# Patient Record
Sex: Female | Born: 1970 | Race: White | Hispanic: No | Marital: Married | Smoking: Never smoker
Health system: Southern US, Community
[De-identification: ages and names within clinical notes are randomized; demographics above are authoritative.]

## PROBLEM LIST (undated history)

## (undated) DIAGNOSIS — Z1371 Encounter for nonprocreative screening for genetic disease carrier status: Secondary | ICD-10-CM

## (undated) DIAGNOSIS — Z9889 Other specified postprocedural states: Secondary | ICD-10-CM

## (undated) DIAGNOSIS — C801 Malignant (primary) neoplasm, unspecified: Secondary | ICD-10-CM

## (undated) DIAGNOSIS — C439 Malignant melanoma of skin, unspecified: Secondary | ICD-10-CM

## (undated) DIAGNOSIS — C50919 Malignant neoplasm of unspecified site of unspecified female breast: Secondary | ICD-10-CM

## (undated) DIAGNOSIS — D649 Anemia, unspecified: Secondary | ICD-10-CM

## (undated) DIAGNOSIS — R112 Nausea with vomiting, unspecified: Secondary | ICD-10-CM

## (undated) DIAGNOSIS — Z9221 Personal history of antineoplastic chemotherapy: Secondary | ICD-10-CM

## (undated) HISTORY — DX: Malignant (primary) neoplasm, unspecified: C80.1

## (undated) HISTORY — DX: Malignant melanoma of skin, unspecified: C43.9

## (undated) HISTORY — DX: Malignant neoplasm of unspecified site of unspecified female breast: C50.919

## (undated) HISTORY — DX: Encounter for nonprocreative screening for genetic disease carrier status: Z13.71

---

## 2008-02-01 ENCOUNTER — Ambulatory Visit: Payer: Self-pay

## 2009-12-21 DIAGNOSIS — C439 Malignant melanoma of skin, unspecified: Secondary | ICD-10-CM

## 2009-12-21 HISTORY — DX: Malignant melanoma of skin, unspecified: C43.9

## 2010-01-21 ENCOUNTER — Ambulatory Visit: Payer: Self-pay | Admitting: Dermatology

## 2010-01-25 ENCOUNTER — Ambulatory Visit: Payer: Self-pay | Admitting: Dermatology

## 2010-02-20 HISTORY — PX: MELANOMA EXCISION: SHX5266

## 2011-01-18 ENCOUNTER — Ambulatory Visit: Payer: Self-pay | Admitting: Dermatology

## 2013-02-25 ENCOUNTER — Ambulatory Visit: Payer: Self-pay

## 2014-03-16 ENCOUNTER — Ambulatory Visit: Payer: Self-pay

## 2014-03-22 DIAGNOSIS — C50919 Malignant neoplasm of unspecified site of unspecified female breast: Secondary | ICD-10-CM

## 2014-03-22 DIAGNOSIS — Z1371 Encounter for nonprocreative screening for genetic disease carrier status: Secondary | ICD-10-CM

## 2014-03-22 HISTORY — DX: Malignant neoplasm of unspecified site of unspecified female breast: C50.919

## 2014-03-22 HISTORY — PX: PORTACATH PLACEMENT: SHX2246

## 2014-03-22 HISTORY — DX: Encounter for nonprocreative screening for genetic disease carrier status: Z13.71

## 2014-03-24 ENCOUNTER — Ambulatory Visit: Payer: Self-pay

## 2014-03-24 DIAGNOSIS — C801 Malignant (primary) neoplasm, unspecified: Secondary | ICD-10-CM

## 2014-03-24 HISTORY — PX: BREAST BIOPSY: SHX20

## 2014-03-24 HISTORY — DX: Malignant (primary) neoplasm, unspecified: C80.1

## 2014-03-30 ENCOUNTER — Ambulatory Visit: Payer: BC Managed Care – PPO

## 2014-03-30 ENCOUNTER — Encounter: Payer: Self-pay | Admitting: General Surgery

## 2014-03-30 ENCOUNTER — Ambulatory Visit (INDEPENDENT_AMBULATORY_CARE_PROVIDER_SITE_OTHER): Payer: BC Managed Care – PPO | Admitting: General Surgery

## 2014-03-30 VITALS — BP 110/76 | HR 76 | Resp 12 | Ht 64.0 in | Wt 117.0 lb

## 2014-03-30 DIAGNOSIS — C50911 Malignant neoplasm of unspecified site of right female breast: Secondary | ICD-10-CM

## 2014-03-30 NOTE — Progress Notes (Signed)
Patient ID: Alecea Trego, female   DOB: Feb 21, 1971, 43 y.o.   MRN: [id number]  Chief Complaint  Patient presents with  . Cancer    right breast    HPI Landyn Lorincz is a 44 y.o. female.  Here today for breast cancer evaluation. Prior to her recent mammogram she could not feel anything different in the breast. Denies any breast trauma and no family history of breast cancer.   Her mother had lung cancer in her mid 79's and has since passed.  Her routine mammogram was 03-04-14. Additional views showed a progressive area of microcalcifications from her fall 2014 exam. 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 She is here today with her husband, Remo Lipps. She has two boys 6 and 13 whom she did breastfeed.  She did complete genetic testing this morning at her GYN office.   HPI  Past Medical History  Diagnosis Date  . Cancer 03-24-14    Right breast, microcalcifications. ER negative, PR less than 10%, HER-2/neu not amplified.  . 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 Breast biopsy Right 03-24-14  . Melanoma excision  Nov 2011    neck    Family History  Problem Relation Age of Onset  . Cancer Mother     lung ? mid 110    Social History History  Substance Use Topics  . Smoking status: Never Smoker   . Smokeless tobacco: Never Used  . Alcohol Use: 0.0 oz/week    0 Not specified per week     Comment: 3/week    Allergies  Allergen Reactions  . Sulfa Antibiotics Nausea Only    No current outpatient prescriptions on file.   No current facility-administered medications for this visit.    Review of Systems Review of Systems  Constitutional: Negative.   Respiratory: Negative.   Cardiovascular: Negative.     Blood pressure 110/76, pulse 76, resp. rate 12, height 5' 4"  (1.626 m), weight 117 lb (53.071 kg), last menstrual period 03/06/2014.  Physical  Exam Physical Exam  Constitutional: She is oriented to person, place, and time. She appears well-developed and well-nourished.  Neck: Neck supple.  Cardiovascular: Normal rate, regular rhythm and normal heart sounds.   Pulmonary/Chest: Effort normal and breath sounds normal. Right breast exhibits no inverted nipple, no mass, no nipple discharge, no skin change and no tenderness. Left breast exhibits no inverted nipple, no mass, no nipple discharge, no skin change and no tenderness.  Lymphadenopathy:    She has no cervical adenopathy.    She has no axillary adenopathy.  Neurological: She is alert and oriented to person, place, and time.  Skin: Skin is warm and dry.    Data Reviewed [phone] mammograms reviewed. Her most recent study showed an increasing cluster of microcalcifications deep in the right breast in the upper outer quadrant.  Biopsy dated 03/24/2014 showed invasive mammary carcinoma no special type. Histologic grade 3. ER negative, PR less than 10%, HER-2/neu not amplified. In the core biopsies the size was 6 mm at a minimum.  Ultrasound examination of the right breast was completed to determine if the biopsy site be identified should the patient choose breast conservation. In the right breast that the eighth 30-9:00 position, 6 cm from the nipple the biopsy clip is evident as well as a 1.2 cm biopsy cavity.  In the axilla there is no  enlarged node measuring 1.3 x 1.6 x 1.8 cm. Loss of normal architecture is appreciated. No additional nodes are appreciated. Enlarged lymph nodes were not noted nor seen today on her recent mammograms.  FNA sampling of the node was recommended and accepted by the patient. This was completed using 1 mL of 1% plain Xylocaine. Multiple passes with a 22-gauge needle were undertaken. Bleeding was noted. Slides 4 were prepared for cytology.  Considering the hematoma formation evident on clinical exam and bruising around the areola, this is most likely  reactive in nature.    Assessment    Right breast cancer, likely stage I.    Plan    Options for management were reviewed: 1) mastectomy with or without immediate reconstruction versus 2) wide local excision followed by postoperative radiation therapy were presented as therapeutically equivalent. Pros and cons of each were discussed.  The likelihood that she is a BRCA carrier is likely less than 5%, and is not absolutely mandatory that she wait until her testing results are available before deciding her treatment options.  Baseline laboratory studies were obtained today including a CBC, comprehensive metabolic panel, CEA and CA 27-29.  Informational brochure was provided.  Opportunity for second surgical opinion locally or at one of the [facility] was discussed.  The patient will consider her options and notify the office of how she would like to proceed.     PCP:  Perrin Maltese Ref: Ardeth Perfect   Robert Bellow 03/30/2014, 5:43 PM

## 2014-03-30 NOTE — Patient Instructions (Signed)
Continue self breast exams. Call office for any new breast issues or concerns. The patient is aware to call back for any questions or concerns. 

## 2014-03-31 ENCOUNTER — Telehealth: Payer: Self-pay | Admitting: *Deleted

## 2014-03-31 LAB — CBC WITH DIFFERENTIAL/PLATELET
BASOS: 1 %
Basophils Absolute: 0.1 10*3/uL (ref 0.0–0.2)
EOS ABS: 0.1 10*3/uL (ref 0.0–0.4)
Eos: 1 %
HEMATOCRIT: 42.2 % (ref 34.0–46.6)
Hemoglobin: 15 g/dL (ref 11.1–15.9)
Immature Grans (Abs): 0 10*3/uL (ref 0.0–0.1)
Immature Granulocytes: 0 %
LYMPHS ABS: 2.5 10*3/uL (ref 0.7–3.1)
Lymphs: 33 %
MCH: 31.4 pg (ref 26.6–33.0)
MCHC: 35.5 g/dL (ref 31.5–35.7)
MCV: 89 fL (ref 79–97)
MONOS ABS: 0.5 10*3/uL (ref 0.1–0.9)
Monocytes: 6 %
NEUTROS ABS: 4.3 10*3/uL (ref 1.4–7.0)
Neutrophils Relative %: 59 %
RBC: 4.77 x10E6/uL (ref 3.77–5.28)
RDW: 12.8 % (ref 12.3–15.4)
WBC: 7.3 10*3/uL (ref 3.4–10.8)

## 2014-03-31 LAB — CEA: CEA: 1.6 ng/mL (ref 0.0–4.7)

## 2014-03-31 LAB — CANCER ANTIGEN 27.29: CA 27.29: 15.3 U/mL (ref 0.0–38.6)

## 2014-03-31 NOTE — Telephone Encounter (Signed)
Patient called the office to report that she would like breast conservation with radiation.   This patient has been scheduled for surgery at [facility] for 04-18-14. Verbal instructions were reviewed by phone. Paperwork mailed to the patient today.

## 2014-04-04 LAB — FINE-NEEDLE ASPIRATION

## 2014-04-05 ENCOUNTER — Telehealth: Payer: Self-pay | Admitting: General Surgery

## 2014-04-05 NOTE — Telephone Encounter (Signed)
FNA of the enlarged right axillary lymph node was positive for metastatic adenocarcinoma.  This moves the patient from stage I to stage II. With an essentially triple negative tumor she is likely a candidate for neoadjuvant chemotherapy. Her family knows Dr. Oliva Bustard from her mother's illness and requested assessment by him. She is amenable to see another physician if he is not available in a timely fashion.  We'll arrange for a PET/CT, if approved by her insurance due to the finding of metastatic axillary disease. We'll hold her December 28 surgery date in the event of port is required for neoadjuvant chemotherapy.

## 2014-04-06 NOTE — Telephone Encounter (Signed)
Patient has been scheduled for an appointment with Dr. Oliva Bustard at the [facility] in [place] for Friday, 04-08-14 at 8:30 am (arrive 8:15 am). Records forwarded for MD review.   This patient has also been scheduled for a PET scan at [facility] for 04-12-14 at 9:30 am (arrive 9:15 am). She is aware of all prep instructions. Patient aware to call by Monday with a status on her LMP. If she has not started by Monday, she will need a serum pregnancy test per Nuclear Medicine protocol. This patient verbalizes understanding and was instructed to call the office if she has further questions.

## 2014-04-08 ENCOUNTER — Telehealth: Payer: Self-pay

## 2014-04-08 ENCOUNTER — Ambulatory Visit: Payer: Self-pay | Admitting: Oncology

## 2014-04-08 NOTE — Telephone Encounter (Signed)
Kiara Grant with Dr Metro Kung office called to notify us that Dr Oliva Bustard and Dr Bary Castilla had spoken and the patient will be having a power port placement done on 04/18/14 at [facility] instead of the lumpectomy that was previously planned. I spoke with the patient and she is aware to call on 04/14/14 to find out her arrival time for surgery on 04/18/14. She will pre admit by phone on 04/11/14 as previously planned. Patient is aware of dates and instructions. The surgery has been updated with the OR.

## 2014-04-11 ENCOUNTER — Ambulatory Visit: Payer: Self-pay | Admitting: General Surgery

## 2014-04-11 ENCOUNTER — Telehealth: Payer: Self-pay | Admitting: General Surgery

## 2014-04-11 ENCOUNTER — Other Ambulatory Visit: Payer: Self-pay | Admitting: General Surgery

## 2014-04-11 DIAGNOSIS — C50911 Malignant neoplasm of unspecified site of right female breast: Secondary | ICD-10-CM

## 2014-04-11 LAB — HCG, QUANTITATIVE, PREGNANCY

## 2014-04-11 NOTE — Telephone Encounter (Signed)
The case has been discussed with Dr. Oliva Bustard for medical oncology. We will place a marking clip in the sentinel node positive on FNA for metastatic cancer to be sure that this is removed at the time of surgery in 2016. The procedure and indication was reviewed with the patient by phone.

## 2014-04-12 ENCOUNTER — Ambulatory Visit: Payer: Self-pay | Admitting: General Surgery

## 2014-04-18 ENCOUNTER — Ambulatory Visit: Payer: Self-pay | Admitting: General Surgery

## 2014-04-18 ENCOUNTER — Encounter: Payer: Self-pay | Admitting: General Surgery

## 2014-04-18 DIAGNOSIS — C50411 Malignant neoplasm of upper-outer quadrant of right female breast: Secondary | ICD-10-CM

## 2014-04-19 ENCOUNTER — Encounter: Payer: Self-pay | Admitting: General Surgery

## 2014-04-19 ENCOUNTER — Telehealth: Payer: Self-pay | Admitting: *Deleted

## 2014-04-19 MED ORDER — PROMETHAZINE HCL 25 MG PO TABS: 25.0000 mg | ORAL_TABLET | Freq: Four times a day (QID) | ORAL | Status: DC | PRN

## 2014-04-19 NOTE — Telephone Encounter (Signed)
Pt had port placement yesterday and she is having some trouble with the medication that was given to her, wanted to talk to you about it.

## 2014-04-19 NOTE — Telephone Encounter (Signed)
She called to let us know that she was having some vomiting last night and this morning with a headache. She will avoid the pain medications since her port is not bothering her as much this morning but would like medication to have on hand for the "sickness" feeling. Phenergan 25 mg oral or suppository q6h PRN #12 per Dr. Bary Castilla. Pt states she prefers the tablets. RX sent.

## 2014-04-22 ENCOUNTER — Ambulatory Visit: Payer: Self-pay | Admitting: Oncology

## 2014-04-25 LAB — CBC CANCER CENTER
Basophil #: 0.1 x10 3/mm (ref 0.0–0.1)
Basophil %: 0.8 %
Eosinophil #: 0.1 x10 3/mm (ref 0.0–0.7)
Eosinophil %: 1.3 %
HCT: 40.1 % (ref 35.0–47.0)
HGB: 13.7 g/dL (ref 12.0–16.0)
Lymphocyte #: 1.5 x10 3/mm (ref 1.0–3.6)
Lymphocyte %: 22 %
MCH: 30.4 pg (ref 26.0–34.0)
MCHC: 34.2 g/dL (ref 32.0–36.0)
MCV: 89 fL (ref 80–100)
MONOS PCT: 7.1 %
Monocyte #: 0.5 x10 3/mm (ref 0.2–0.9)
NEUTROS ABS: 4.7 x10 3/mm (ref 1.4–6.5)
Neutrophil %: 68.8 %
Platelet: 230 x10 3/mm (ref 150–440)
RBC: 4.51 10*6/uL (ref 3.80–5.20)
RDW: 12.6 % (ref 11.5–14.5)
WBC: 6.8 x10 3/mm (ref 3.6–11.0)

## 2014-04-25 LAB — COMPREHENSIVE METABOLIC PANEL
ALK PHOS: 64 U/L
ANION GAP: 11 (ref 7–16)
Albumin: 3.9 g/dL (ref 3.4–5.0)
BUN: 7 mg/dL (ref 7–18)
Bilirubin,Total: 0.4 mg/dL (ref 0.2–1.0)
CALCIUM: 8.8 mg/dL (ref 8.5–10.1)
CO2: 27 mmol/L (ref 21–32)
Chloride: 102 mmol/L (ref 98–107)
Creatinine: 0.98 mg/dL (ref 0.60–1.30)
EGFR (African American): >60
EGFR (Non-African Amer.): >60
Glucose: 91 mg/dL (ref 65–99)
OSMOLALITY: 277 (ref 275–301)
POTASSIUM: 3.9 mmol/L (ref 3.5–5.1)
SGOT(AST): 12 U/L — ABNORMAL LOW (ref 15–37)
SGPT (ALT): 16 U/L
SODIUM: 140 mmol/L (ref 136–145)
Total Protein: 7.7 g/dL (ref 6.4–8.2)

## 2014-04-26 LAB — CANCER ANTIGEN 27.29: CA 27.29: 10.1 U/mL (ref 0.0–38.6)

## 2014-05-02 LAB — CBC CANCER CENTER
BASOS ABS: 0 x10 3/mm (ref 0.0–0.1)
BASOS PCT: 3 %
EOS PCT: 9.3 %
Eosinophil #: 0.1 x10 3/mm (ref 0.0–0.7)
HCT: 39 % (ref 35.0–47.0)
HGB: 13.1 g/dL (ref 12.0–16.0)
LYMPHS ABS: 0.6 x10 3/mm — AB (ref 1.0–3.6)
LYMPHS PCT: 75.2 %
MCH: 30.2 pg (ref 26.0–34.0)
MCHC: 33.5 g/dL (ref 32.0–36.0)
MCV: 90 fL (ref 80–100)
Monocyte #: 0.1 x10 3/mm — ABNORMAL LOW (ref 0.2–0.9)
Monocyte %: 8.5 %
Neutrophil #: 0 x10 3/mm — ABNORMAL LOW (ref 1.4–6.5)
Neutrophil %: 4 %
Platelet: 94 x10 3/mm — ABNORMAL LOW (ref 150–440)
RBC: 4.32 10*6/uL (ref 3.80–5.20)
RDW: 12.3 % (ref 11.5–14.5)
WBC: 0.8 x10 3/mm — AB (ref 3.6–11.0)

## 2014-05-09 LAB — CBC CANCER CENTER
BASOS PCT: 0.6 %
Basophil #: 0 x10 3/mm (ref 0.0–0.1)
EOS PCT: 0.4 %
Eosinophil #: 0 x10 3/mm (ref 0.0–0.7)
HCT: 39.1 % (ref 35.0–47.0)
HGB: 13 g/dL (ref 12.0–16.0)
Lymphocyte #: 1.6 x10 3/mm (ref 1.0–3.6)
Lymphocyte %: 20.5 %
MCH: 29.9 pg (ref 26.0–34.0)
MCHC: 33.4 g/dL (ref 32.0–36.0)
MCV: 90 fL (ref 80–100)
MONO ABS: 0.6 x10 3/mm (ref 0.2–0.9)
Monocyte %: 7 %
NEUTROS PCT: 71.5 %
Neutrophil #: 5.6 x10 3/mm (ref 1.4–6.5)
Platelet: 217 x10 3/mm (ref 150–440)
RBC: 4.35 10*6/uL (ref 3.80–5.20)
RDW: 12.2 % (ref 11.5–14.5)
WBC: 7.9 x10 3/mm (ref 3.6–11.0)

## 2014-05-16 LAB — COMPREHENSIVE METABOLIC PANEL
ALBUMIN: 3.8 g/dL (ref 3.4–5.0)
ALK PHOS: 65 U/L
ANION GAP: 10 (ref 7–16)
BILIRUBIN TOTAL: 0.3 mg/dL (ref 0.2–1.0)
BUN: 9 mg/dL (ref 7–18)
CREATININE: 0.82 mg/dL (ref 0.60–1.30)
Calcium, Total: 8.4 mg/dL — ABNORMAL LOW (ref 8.5–10.1)
Chloride: 102 mmol/L (ref 98–107)
Co2: 27 mmol/L (ref 21–32)
EGFR (African American): >60
EGFR (Non-African Amer.): >60
Glucose: 76 mg/dL (ref 65–99)
OSMOLALITY: 275 (ref 275–301)
POTASSIUM: 3.9 mmol/L (ref 3.5–5.1)
SGOT(AST): 17 U/L (ref 15–37)
SGPT (ALT): 25 U/L
SODIUM: 139 mmol/L (ref 136–145)
TOTAL PROTEIN: 6.9 g/dL (ref 6.4–8.2)

## 2014-05-16 LAB — CBC CANCER CENTER
BASOS PCT: 2.9 %
Basophil #: 0.1 x10 3/mm (ref 0.0–0.1)
Eosinophil #: 0 x10 3/mm (ref 0.0–0.7)
Eosinophil %: 0.2 %
HCT: 37.1 % (ref 35.0–47.0)
HGB: 12.4 g/dL (ref 12.0–16.0)
LYMPHS ABS: 1.1 x10 3/mm (ref 1.0–3.6)
Lymphocyte %: 29.5 %
MCH: 30 pg (ref 26.0–34.0)
MCHC: 33.4 g/dL (ref 32.0–36.0)
MCV: 90 fL (ref 80–100)
MONOS PCT: 15.1 %
Monocyte #: 0.6 x10 3/mm (ref 0.2–0.9)
NEUTROS ABS: 2 x10 3/mm (ref 1.4–6.5)
Neutrophil %: 52.3 %
PLATELETS: 332 x10 3/mm (ref 150–440)
RBC: 4.14 10*6/uL (ref 3.80–5.20)
RDW: 12.8 % (ref 11.5–14.5)
WBC: 3.8 x10 3/mm (ref 3.6–11.0)

## 2014-05-23 ENCOUNTER — Ambulatory Visit: Payer: Self-pay | Admitting: Oncology

## 2014-05-23 LAB — CBC CANCER CENTER
Basophil #: 0.1 x10 3/mm (ref 0.0–0.1)
Basophil %: 3.3 %
EOS ABS: 0 x10 3/mm (ref 0.0–0.7)
Eosinophil %: 1.4 %
HCT: 34.8 % — AB (ref 35.0–47.0)
HGB: 11.9 g/dL — AB (ref 12.0–16.0)
LYMPHS ABS: 0.6 x10 3/mm — AB (ref 1.0–3.6)
Lymphocyte %: 27.4 %
MCH: 30.5 pg (ref 26.0–34.0)
MCHC: 34.1 g/dL (ref 32.0–36.0)
MCV: 89 fL (ref 80–100)
Monocyte #: 0.1 x10 3/mm — ABNORMAL LOW (ref 0.2–0.9)
Monocyte %: 6.6 %
NEUTROS ABS: 1.3 x10 3/mm — AB (ref 1.4–6.5)
Neutrophil %: 61.3 %
Platelet: 149 x10 3/mm — ABNORMAL LOW (ref 150–440)
RBC: 3.89 10*6/uL (ref 3.80–5.20)
RDW: 12.6 % (ref 11.5–14.5)
WBC: 2.1 x10 3/mm — AB (ref 3.6–11.0)

## 2014-05-30 LAB — CBC CANCER CENTER
Basophil #: 0.1 x10 3/mm (ref 0.0–0.1)
Basophil %: 1.4 %
Eosinophil #: 0.1 x10 3/mm (ref 0.0–0.7)
Eosinophil %: 1.4 %
HCT: 35 % (ref 35.0–47.0)
HGB: 12 g/dL (ref 12.0–16.0)
Lymphocyte #: 1 x10 3/mm (ref 1.0–3.6)
Lymphocyte %: 15 %
MCH: 30.5 pg (ref 26.0–34.0)
MCHC: 34.3 g/dL (ref 32.0–36.0)
MCV: 89 fL (ref 80–100)
Monocyte #: 0.6 x10 3/mm (ref 0.2–0.9)
Monocyte %: 9.5 %
NEUTROS PCT: 72.7 %
Neutrophil #: 4.9 x10 3/mm (ref 1.4–6.5)
Platelet: 137 x10 3/mm — ABNORMAL LOW (ref 150–440)
RBC: 3.93 10*6/uL (ref 3.80–5.20)
RDW: 12.9 % (ref 11.5–14.5)
WBC: 6.8 x10 3/mm (ref 3.6–11.0)

## 2014-06-21 ENCOUNTER — Ambulatory Visit
Admit: 2014-06-21 | Disposition: A | Discharge status: Home / Self Care | Payer: Self-pay | Attending: Oncology | Admitting: Oncology

## 2014-07-19 LAB — CBC CANCER CENTER
BASOS ABS: 0.1 x10 3/mm (ref 0.0–0.1)
BASOS PCT: 3.1 %
EOS ABS: 0 x10 3/mm (ref 0.0–0.7)
Eosinophil %: 0.9 %
HCT: 34.1 % — ABNORMAL LOW (ref 35.0–47.0)
HGB: 11.7 g/dL — AB (ref 12.0–16.0)
Lymphocyte #: 0.8 x10 3/mm — ABNORMAL LOW (ref 1.0–3.6)
Lymphocyte %: 17 %
MCH: 32.5 pg (ref 26.0–34.0)
MCHC: 34.4 g/dL (ref 32.0–36.0)
MCV: 95 fL (ref 80–100)
MONO ABS: 0.5 x10 3/mm (ref 0.2–0.9)
Monocyte %: 11.8 %
NEUTROS ABS: 3 x10 3/mm (ref 1.4–6.5)
NEUTROS PCT: 67.2 %
Platelet: 257 x10 3/mm (ref 150–440)
RBC: 3.61 10*6/uL — ABNORMAL LOW (ref 3.80–5.20)
RDW: 16.6 % — AB (ref 11.5–14.5)
WBC: 4.4 x10 3/mm (ref 3.6–11.0)

## 2014-07-19 LAB — COMPREHENSIVE METABOLIC PANEL
ALBUMIN: 4.5 g/dL
ALK PHOS: 48 U/L
ALT: 29 U/L
Anion Gap: 3 — ABNORMAL LOW (ref 7–16)
BUN: 11 mg/dL
Bilirubin,Total: 0.5 mg/dL
CREATININE: 0.77 mg/dL
Calcium, Total: 8.9 mg/dL
Chloride: 105 mmol/L
Co2: 27 mmol/L
Glucose: 98 mg/dL
POTASSIUM: 3.8 mmol/L
SGOT(AST): 30 U/L
SODIUM: 135 mmol/L
Total Protein: 7.5 g/dL

## 2014-07-22 ENCOUNTER — Ambulatory Visit
Admit: 2014-07-22 | Disposition: A | Discharge status: Home / Self Care | Payer: Self-pay | Attending: Oncology | Admitting: Oncology

## 2014-07-25 LAB — CREATININE, SERUM: CREATINE, SERUM: 0.77

## 2014-07-26 LAB — CBC CANCER CENTER
BASOS ABS: 0.1 x10 3/mm (ref 0.0–0.1)
Basophil %: 3 %
EOS PCT: 5.3 %
Eosinophil #: 0.1 x10 3/mm (ref 0.0–0.7)
HCT: 32.2 % — ABNORMAL LOW (ref 35.0–47.0)
HGB: 11 g/dL — ABNORMAL LOW (ref 12.0–16.0)
Lymphocyte #: 0.8 x10 3/mm — ABNORMAL LOW (ref 1.0–3.6)
Lymphocyte %: 28.7 %
MCH: 32.6 pg (ref 26.0–34.0)
MCHC: 34.2 g/dL (ref 32.0–36.0)
MCV: 95 fL (ref 80–100)
MONOS PCT: 12.5 %
Monocyte #: 0.3 x10 3/mm (ref 0.2–0.9)
Neutrophil #: 1.4 x10 3/mm (ref 1.4–6.5)
Neutrophil %: 50.5 %
PLATELETS: 186 x10 3/mm (ref 150–440)
RBC: 3.38 10*6/uL — ABNORMAL LOW (ref 3.80–5.20)
RDW: 15.3 % — ABNORMAL HIGH (ref 11.5–14.5)
WBC: 2.8 x10 3/mm — ABNORMAL LOW (ref 3.6–11.0)

## 2014-08-01 ENCOUNTER — Other Ambulatory Visit: Payer: Self-pay | Admitting: Oncology

## 2014-08-02 LAB — CBC CANCER CENTER
BASOS ABS: 0.1 x10 3/mm (ref 0.0–0.1)
Basophil %: 2 %
EOS PCT: 5.8 %
Eosinophil #: 0.2 x10 3/mm (ref 0.0–0.7)
HCT: 33.2 % — ABNORMAL LOW (ref 35.0–47.0)
HGB: 11.1 g/dL — ABNORMAL LOW (ref 12.0–16.0)
LYMPHS ABS: 0.9 x10 3/mm — AB (ref 1.0–3.6)
Lymphocyte %: 25.6 %
MCH: 32.4 pg (ref 26.0–34.0)
MCHC: 33.4 g/dL (ref 32.0–36.0)
MCV: 97 fL (ref 80–100)
MONO ABS: 0.3 x10 3/mm (ref 0.2–0.9)
Monocyte %: 8.3 %
Neutrophil #: 2 x10 3/mm (ref 1.4–6.5)
Neutrophil %: 58.3 %
PLATELETS: 190 x10 3/mm (ref 150–440)
RBC: 3.42 10*6/uL — AB (ref 3.80–5.20)
RDW: 14.9 % — ABNORMAL HIGH (ref 11.5–14.5)
WBC: 3.4 x10 3/mm — ABNORMAL LOW (ref 3.6–11.0)

## 2014-08-02 LAB — COMPREHENSIVE METABOLIC PANEL
ALBUMIN: 4.5 g/dL
ALK PHOS: 44 U/L
ALT: 54 U/L
Anion Gap: 4 — ABNORMAL LOW (ref 7–16)
BILIRUBIN TOTAL: 0.5 mg/dL
BUN: 11 mg/dL
CO2: 29 mmol/L
Calcium, Total: 9.3 mg/dL
Chloride: 109 mmol/L
Creatinine: 0.73 mg/dL
EGFR (African American): >60
EGFR (Non-African Amer.): >60
Glucose: 103 mg/dL — ABNORMAL HIGH
POTASSIUM: 3.9 mmol/L
SGOT(AST): 42 U/L — ABNORMAL HIGH
Sodium: 142 mmol/L
TOTAL PROTEIN: 7.5 g/dL

## 2014-08-02 LAB — MAGNESIUM: Magnesium: 2 mg/dL

## 2014-08-09 LAB — CBC CANCER CENTER
BASOS PCT: 2.2 %
Basophil #: 0.1 x10 3/mm (ref 0.0–0.1)
EOS ABS: 0.2 x10 3/mm (ref 0.0–0.7)
EOS PCT: 4.4 %
HCT: 32.6 % — ABNORMAL LOW (ref 35.0–47.0)
HGB: 11.3 g/dL — AB (ref 12.0–16.0)
LYMPHS PCT: 26.9 %
Lymphocyte #: 0.9 x10 3/mm — ABNORMAL LOW (ref 1.0–3.6)
MCH: 33.2 pg (ref 26.0–34.0)
MCHC: 34.6 g/dL (ref 32.0–36.0)
MCV: 96 fL (ref 80–100)
MONO ABS: 0.3 x10 3/mm (ref 0.2–0.9)
Monocyte %: 7.5 %
NEUTROS PCT: 59 %
Neutrophil #: 2.1 x10 3/mm (ref 1.4–6.5)
PLATELETS: 213 x10 3/mm (ref 150–440)
RBC: 3.41 10*6/uL — ABNORMAL LOW (ref 3.80–5.20)
RDW: 14.5 % (ref 11.5–14.5)
WBC: 3.5 x10 3/mm — AB (ref 3.6–11.0)

## 2014-08-13 NOTE — Op Note (Signed)
PATIENT NAME:  Kiara Grant, PUCKETT MR#:  [id number] DATE OF BIRTH:  1971-03-15  DATE OF PROCEDURE:  04/18/2014  PREOPERATIVE DIAGNOSIS:  Right breast cancer with axillary metastasis.   POSTOPERATIVE DIAGNOSIS:  Right breast cancer with axillary metastasis.   OPERATIVE PROCEDURE: 1.  Placement of left subclavian PowerPort.  2.  Placement of a biopsy clip in the right axillary lymph node.   SURGEON:  Robert Bellow, MD  ANESTHESIA:  Attended local, 12 mL of 1% plain Xylocaine.   ESTIMATED BLOOD LOSS:  Minimal.   CLINICAL NOTE:  This 44 year old woman was recently noted with an invasive mammary carcinoma of the right breast. Examination by ultrasound showed evidence of an enlarged axillary lymph node and FNA showed evidence of metastatic adenocarcinoma. She was felt to be a candidate for neoadjuvant chemotherapy. Central venous access was requested by the treating oncologist as well as placement of a marking clip in the right axillary node for evaluation post neoadjuvant treatment.   OPERATIVE NOTE:  With the patient comfortably supine on the operating table, the left chest was prepped with ChloraPrep and draped. Ultrasound was used to confirm patency of the left subclavian vein. Under ultrasound guidance, the vein was cannulated, followed by passage of a guidewire and dilator. Under fluoroscopy, the catheter tip was placed in the junction of the SVC and right atrium. It was tunneled to a pocket on the left anterior chest. The port was anchored with 2-point fixation with 3-0 Prolene. The wound was closed with 3-0 Vicryl to the adipose layer and a running 4-0 Vicryl subcuticular suture for the skin. Benzoin and Steri-Strips followed by Telfa and Tegaderm dressings were applied after confirming easy aspiration and irrigation with injectable saline.   The right axilla was prepped with ChloraPrep and draped. Ultrasound was used to identify the previously identified enlarged lymph node. A small stab  wound incision was made after instillation of local anesthetic and a Bard biopsy clip placed into the base of the lymph node under ultrasound guidance. Image confirmation was completed.   The patient tolerated the procedure well and was taken to the recovery room in stable condition. Postprocedure chest x-ray showed the catheter tip as described above and no evidence of pneumothorax.    ____________________________ Robert Bellow, MD jwb:nb D: 04/18/2014 20:11:43 ET T: 04/18/2014 22:57:30 ET JOB#: [id number]  cc: Robert Bellow, MD, <Dictator> Martie Lee. Oliva Bustard, MD Deyonna Fitzsimmons Amedeo Kinsman MD ELECTRONICALLY SIGNED 04/19/2014 9:23

## 2014-08-15 LAB — SURGICAL PATHOLOGY

## 2014-08-16 LAB — COMPREHENSIVE METABOLIC PANEL
ALBUMIN: 4.4 g/dL
ANION GAP: 5 — AB (ref 7–16)
Alkaline Phosphatase: 45 U/L
BILIRUBIN TOTAL: 0.4 mg/dL
BUN: 9 mg/dL
CREATININE: 0.71 mg/dL
Calcium, Total: 9 mg/dL
Chloride: 106 mmol/L
Co2: 27 mmol/L
Glucose: 118 mg/dL — ABNORMAL HIGH
POTASSIUM: 3.7 mmol/L
SGOT(AST): 28 U/L
SGPT (ALT): 31 U/L
Sodium: 138 mmol/L
TOTAL PROTEIN: 7.2 g/dL

## 2014-08-16 LAB — CBC CANCER CENTER
BASOS ABS: 0.1 x10 3/mm (ref 0.0–0.1)
Basophil %: 1.9 %
EOS ABS: 0.1 x10 3/mm (ref 0.0–0.7)
Eosinophil %: 5 %
HCT: 32.7 % — AB (ref 35.0–47.0)
HGB: 11.3 g/dL — ABNORMAL LOW (ref 12.0–16.0)
Lymphocyte #: 0.9 x10 3/mm — ABNORMAL LOW (ref 1.0–3.6)
Lymphocyte %: 31.3 %
MCH: 33.4 pg (ref 26.0–34.0)
MCHC: 34.6 g/dL (ref 32.0–36.0)
MCV: 96 fL (ref 80–100)
MONO ABS: 0.2 x10 3/mm (ref 0.2–0.9)
MONOS PCT: 8 %
Neutrophil #: 1.5 x10 3/mm (ref 1.4–6.5)
Neutrophil %: 53.8 %
PLATELETS: 203 x10 3/mm (ref 150–440)
RBC: 3.39 10*6/uL — ABNORMAL LOW (ref 3.80–5.20)
RDW: 14 % (ref 11.5–14.5)
WBC: 2.8 x10 3/mm — AB (ref 3.6–11.0)

## 2014-08-23 ENCOUNTER — Inpatient Hospital Stay: Payer: BLUE CROSS/BLUE SHIELD | Attending: Oncology

## 2014-08-23 ENCOUNTER — Ambulatory Visit: Payer: BLUE CROSS/BLUE SHIELD

## 2014-08-23 VITALS — BP 130/74 | HR 74 | Temp 98.0°F | Resp 20

## 2014-08-23 DIAGNOSIS — Z79899 Other long term (current) drug therapy: Secondary | ICD-10-CM | POA: Diagnosis not present

## 2014-08-23 DIAGNOSIS — Z5111 Encounter for antineoplastic chemotherapy: Secondary | ICD-10-CM | POA: Insufficient documentation

## 2014-08-23 DIAGNOSIS — C50511 Malignant neoplasm of lower-outer quadrant of right female breast: Secondary | ICD-10-CM | POA: Diagnosis present

## 2014-08-23 DIAGNOSIS — Z170 Estrogen receptor positive status [ER+]: Secondary | ICD-10-CM | POA: Diagnosis not present

## 2014-08-23 LAB — CBC WITH DIFFERENTIAL/PLATELET
BASOS ABS: 0 10*3/uL (ref 0–0.1)
Eosinophils Absolute: 0.1 10*3/uL (ref 0–0.7)
HEMATOCRIT: 33.3 % — AB (ref 35.0–47.0)
HEMOGLOBIN: 11.3 g/dL — AB (ref 12.0–16.0)
Lymphs Abs: 0.9 10*3/uL — ABNORMAL LOW (ref 1.0–3.6)
MCH: 32.9 pg (ref 26.0–34.0)
MCHC: 33.9 g/dL (ref 32.0–36.0)
MCV: 97.1 fL (ref 80.0–100.0)
Monocytes Absolute: 0.3 10*3/uL (ref 0.2–0.9)
Monocytes Relative: 8 %
Neutro Abs: 1.9 10*3/uL (ref 1.4–6.5)
Neutrophils Relative %: 59 %
Platelets: 202 10*3/uL (ref 150–440)
RBC: 3.43 MIL/uL — ABNORMAL LOW (ref 3.80–5.20)
RDW: 14.1 % (ref 11.5–14.5)
WBC: 3.1 10*3/uL — AB (ref 3.6–11.0)

## 2014-08-23 LAB — COMPREHENSIVE METABOLIC PANEL
ALT: 58 U/L — AB (ref 14–54)
ANION GAP: 5 (ref 5–15)
AST: 40 U/L (ref 15–41)
Albumin: 4.4 g/dL (ref 3.5–5.0)
Alkaline Phosphatase: 42 U/L (ref 38–126)
BILIRUBIN TOTAL: 0.5 mg/dL (ref 0.3–1.2)
BUN: 11 mg/dL (ref 6–20)
CO2: 27 mmol/L (ref 22–32)
Calcium: 8.8 mg/dL — ABNORMAL LOW (ref 8.9–10.3)
Chloride: 104 mmol/L (ref 101–111)
Creatinine, Ser: 0.73 mg/dL (ref 0.44–1.00)
GFR calc Af Amer: >60 mL/min (ref >60)
GFR calc non Af Amer: >60 mL/min (ref >60)
GLUCOSE: 106 mg/dL — AB (ref 65–99)
Potassium: 3.9 mmol/L (ref 3.5–5.1)
SODIUM: 136 mmol/L (ref 135–145)
Total Protein: 7.1 g/dL (ref 6.5–8.1)

## 2014-08-23 LAB — CBC
HCT: 32.9 % — ABNORMAL LOW (ref 35.0–47.0)
Hemoglobin: 11.4 g/dL — ABNORMAL LOW (ref 12.0–16.0)
MCH: 33.4 pg (ref 26.0–34.0)
MCHC: 34.5 g/dL (ref 32.0–36.0)
MCV: 96.9 fL (ref 80.0–100.0)
Platelets: 198 10*3/uL (ref 150–440)
RBC: 3.4 MIL/uL — ABNORMAL LOW (ref 3.80–5.20)
RDW: 13.9 % (ref 11.5–14.5)
WBC: 3 10*3/uL — ABNORMAL LOW (ref 3.6–11.0)

## 2014-08-23 MED ORDER — SODIUM CHLORIDE 0.9 % IV SOLN
Freq: Once | INTRAVENOUS | Status: AC
  Administered 2014-08-23: 17:00:00 via INTRAVENOUS
  Filled 2014-08-23: qty 250

## 2014-08-23 MED ORDER — FAMOTIDINE IN NACL 20-0.9 MG/50ML-% IV SOLN
20.0000 mg | Freq: Once | INTRAVENOUS | Status: AC
  Administered 2014-08-23: 20 mg via INTRAVENOUS

## 2014-08-23 MED ORDER — ACETAMINOPHEN 325 MG PO TABS
650.0000 mg | ORAL_TABLET | Freq: Once | ORAL | Status: AC
  Administered 2014-08-23: 650 mg via ORAL

## 2014-08-23 MED ORDER — SODIUM CHLORIDE 0.9 % IJ SOLN
10.0000 mL | Freq: Once | INTRAMUSCULAR | Status: AC
  Administered 2014-08-23: 10 mL via INTRAVENOUS
  Filled 2014-08-23: qty 10

## 2014-08-23 MED ORDER — DIPHENHYDRAMINE HCL 50 MG/ML IJ SOLN
50.0000 mg | Freq: Once | INTRAMUSCULAR | Status: AC
  Administered 2014-08-23: 50 mg via INTRAVENOUS

## 2014-08-23 MED ORDER — HEPARIN SOD (PORK) LOCK FLUSH 100 UNIT/ML IV SOLN
500.0000 [IU] | Freq: Once | INTRAVENOUS | Status: AC | PRN
  Administered 2014-08-23: 500 [IU]

## 2014-08-23 MED ORDER — SODIUM CHLORIDE 0.9 % IV SOLN
Freq: Once | INTRAVENOUS | Status: AC
  Administered 2014-08-23: 17:00:00 via INTRAVENOUS
  Filled 2014-08-23: qty 4

## 2014-08-23 MED ORDER — PACLITAXEL CHEMO INJECTION 300 MG/50ML
80.0000 mg/m2 | Freq: Once | INTRAVENOUS | Status: AC
  Administered 2014-08-23: 126 mg via INTRAVENOUS
  Filled 2014-08-23: qty 21

## 2014-08-23 MED ORDER — HEPARIN SOD (PORK) LOCK FLUSH 100 UNIT/ML IV SOLN
INTRAVENOUS | Status: AC
  Filled 2014-08-23: qty 5

## 2014-08-23 MED ORDER — SODIUM CHLORIDE 0.9 % IJ SOLN
10.0000 mL | INTRAMUSCULAR | Status: DC | PRN
  Administered 2014-08-23: 10 mL
  Filled 2014-08-23: qty 10

## 2014-08-26 ENCOUNTER — Other Ambulatory Visit: Payer: Self-pay | Admitting: *Deleted

## 2014-08-26 DIAGNOSIS — C50919 Malignant neoplasm of unspecified site of unspecified female breast: Secondary | ICD-10-CM

## 2014-08-30 ENCOUNTER — Inpatient Hospital Stay: Payer: BLUE CROSS/BLUE SHIELD

## 2014-08-30 ENCOUNTER — Inpatient Hospital Stay (HOSPITAL_BASED_OUTPATIENT_CLINIC_OR_DEPARTMENT_OTHER): Payer: BLUE CROSS/BLUE SHIELD | Admitting: Oncology

## 2014-08-30 ENCOUNTER — Other Ambulatory Visit: Payer: Self-pay | Admitting: Oncology

## 2014-08-30 VITALS — BP 99/61 | HR 70 | Resp 18

## 2014-08-30 VITALS — BP 98/64 | HR 87 | Temp 96.2°F | Wt 120.8 lb

## 2014-08-30 DIAGNOSIS — C50911 Malignant neoplasm of unspecified site of right female breast: Secondary | ICD-10-CM

## 2014-08-30 DIAGNOSIS — C50919 Malignant neoplasm of unspecified site of unspecified female breast: Secondary | ICD-10-CM

## 2014-08-30 DIAGNOSIS — Z79899 Other long term (current) drug therapy: Secondary | ICD-10-CM

## 2014-08-30 DIAGNOSIS — Z170 Estrogen receptor positive status [ER+]: Secondary | ICD-10-CM | POA: Diagnosis not present

## 2014-08-30 DIAGNOSIS — C50511 Malignant neoplasm of lower-outer quadrant of right female breast: Secondary | ICD-10-CM

## 2014-08-30 LAB — COMPREHENSIVE METABOLIC PANEL
ALT: 80 U/L — ABNORMAL HIGH (ref 14–54)
ANION GAP: 6 (ref 5–15)
AST: 59 U/L — AB (ref 15–41)
Albumin: 4.5 g/dL (ref 3.5–5.0)
Alkaline Phosphatase: 47 U/L (ref 38–126)
BILIRUBIN TOTAL: 0.4 mg/dL (ref 0.3–1.2)
BUN: 12 mg/dL (ref 6–20)
CALCIUM: 9 mg/dL (ref 8.9–10.3)
CO2: 27 mmol/L (ref 22–32)
CREATININE: 0.71 mg/dL (ref 0.44–1.00)
Chloride: 104 mmol/L (ref 101–111)
GFR calc Af Amer: >60 mL/min (ref >60)
GFR calc non Af Amer: >60 mL/min (ref >60)
Glucose, Bld: 116 mg/dL — ABNORMAL HIGH (ref 65–99)
Potassium: 3.9 mmol/L (ref 3.5–5.1)
Sodium: 137 mmol/L (ref 135–145)
Total Protein: 7.5 g/dL (ref 6.5–8.1)

## 2014-08-30 LAB — CBC WITH DIFFERENTIAL/PLATELET
Basophils Absolute: 0.1 10*3/uL (ref 0–0.1)
Basophils Relative: 2 %
EOS ABS: 0.1 10*3/uL (ref 0–0.7)
Eosinophils Relative: 3 %
HCT: 35 % (ref 35.0–47.0)
HEMOGLOBIN: 12.1 g/dL (ref 12.0–16.0)
LYMPHS ABS: 0.9 10*3/uL — AB (ref 1.0–3.6)
Lymphocytes Relative: 31 %
MCH: 33.2 pg (ref 26.0–34.0)
MCHC: 34.4 g/dL (ref 32.0–36.0)
MCV: 96.6 fL (ref 80.0–100.0)
Monocytes Absolute: 0.2 10*3/uL (ref 0.2–0.9)
Monocytes Relative: 8 %
Neutro Abs: 1.6 10*3/uL (ref 1.4–6.5)
Neutrophils Relative %: 56 %
Platelets: 205 10*3/uL (ref 150–440)
RBC: 3.63 MIL/uL — AB (ref 3.80–5.20)
RDW: 13.8 % (ref 11.5–14.5)
WBC: 2.9 10*3/uL — ABNORMAL LOW (ref 3.6–11.0)

## 2014-08-30 MED ORDER — ACETAMINOPHEN 325 MG PO TABS
650.0000 mg | ORAL_TABLET | Freq: Once | ORAL | Status: AC
Start: 2014-08-30 — End: 2014-08-30
  Administered 2014-08-30: 650 mg via ORAL
  Filled 2014-08-30: qty 2

## 2014-08-30 MED ORDER — DEXTROSE 5 % IV SOLN
80.0000 mg/m2 | Freq: Once | INTRAVENOUS | Status: AC
  Administered 2014-08-30: 126 mg via INTRAVENOUS
  Filled 2014-08-30: qty 21

## 2014-08-30 MED ORDER — SODIUM CHLORIDE 0.9 % IV SOLN
Freq: Once | INTRAVENOUS | Status: AC
  Administered 2014-08-30: 17:00:00 via INTRAVENOUS
  Filled 2014-08-30: qty 4

## 2014-08-30 MED ORDER — HEPARIN SOD (PORK) LOCK FLUSH 100 UNIT/ML IV SOLN
500.0000 [IU] | Freq: Once | INTRAVENOUS | Status: AC | PRN
  Administered 2014-08-30: 500 [IU]
  Filled 2014-08-30: qty 5

## 2014-08-30 MED ORDER — DIPHENHYDRAMINE HCL 50 MG/ML IJ SOLN
25.0000 mg | Freq: Once | INTRAMUSCULAR | Status: AC
  Administered 2014-08-30: 25 mg via INTRAVENOUS
  Filled 2014-08-30: qty 1

## 2014-08-30 MED ORDER — SODIUM CHLORIDE 0.9 % IV SOLN
Freq: Once | INTRAVENOUS | Status: AC
  Administered 2014-08-30: 16:00:00 via INTRAVENOUS
  Filled 2014-08-30: qty 250

## 2014-08-30 MED ORDER — FAMOTIDINE IN NACL 20-0.9 MG/50ML-% IV SOLN
20.0000 mg | Freq: Once | INTRAVENOUS | Status: AC
  Administered 2014-08-30: 20 mg via INTRAVENOUS
  Filled 2014-08-30: qty 50

## 2014-08-30 MED ORDER — SODIUM CHLORIDE 0.9 % IJ SOLN
10.0000 mL | INTRAMUSCULAR | Status: DC | PRN
  Administered 2014-08-30: 10 mL
  Filled 2014-08-30: qty 10

## 2014-09-06 ENCOUNTER — Inpatient Hospital Stay: Payer: BLUE CROSS/BLUE SHIELD

## 2014-09-06 VITALS — BP 105/67 | HR 82 | Temp 97.0°F | Resp 20

## 2014-09-06 DIAGNOSIS — C50511 Malignant neoplasm of lower-outer quadrant of right female breast: Secondary | ICD-10-CM | POA: Diagnosis not present

## 2014-09-06 DIAGNOSIS — C50919 Malignant neoplasm of unspecified site of unspecified female breast: Secondary | ICD-10-CM

## 2014-09-06 DIAGNOSIS — C50911 Malignant neoplasm of unspecified site of right female breast: Secondary | ICD-10-CM

## 2014-09-06 LAB — CBC WITH DIFFERENTIAL/PLATELET
BASOS PCT: 1 %
Basophils Absolute: 0 10*3/uL (ref 0–0.1)
EOS ABS: 0.1 10*3/uL (ref 0–0.7)
EOS PCT: 2 %
HEMATOCRIT: 35 % (ref 35.0–47.0)
Hemoglobin: 12 g/dL (ref 12.0–16.0)
LYMPHS ABS: 1 10*3/uL (ref 1.0–3.6)
Lymphocytes Relative: 26 %
MCH: 33.2 pg (ref 26.0–34.0)
MCHC: 34.3 g/dL (ref 32.0–36.0)
MCV: 96.6 fL (ref 80.0–100.0)
MONO ABS: 0.3 10*3/uL (ref 0.2–0.9)
MONOS PCT: 8 %
NEUTROS ABS: 2.3 10*3/uL (ref 1.4–6.5)
NEUTROS PCT: 63 %
Platelets: 228 10*3/uL (ref 150–440)
RBC: 3.62 MIL/uL — AB (ref 3.80–5.20)
RDW: 13.7 % (ref 11.5–14.5)
WBC: 3.7 10*3/uL (ref 3.6–11.0)

## 2014-09-06 LAB — COMPREHENSIVE METABOLIC PANEL
ALT: 37 U/L (ref 14–54)
ANION GAP: 6 (ref 5–15)
AST: 27 U/L (ref 15–41)
Albumin: 4.4 g/dL (ref 3.5–5.0)
Alkaline Phosphatase: 48 U/L (ref 38–126)
BUN: 10 mg/dL (ref 6–20)
CO2: 29 mmol/L (ref 22–32)
Calcium: 9.3 mg/dL (ref 8.9–10.3)
Chloride: 103 mmol/L (ref 101–111)
Creatinine, Ser: 0.66 mg/dL (ref 0.44–1.00)
GFR calc Af Amer: >60 mL/min (ref >60)
GFR calc non Af Amer: >60 mL/min (ref >60)
GLUCOSE: 97 mg/dL (ref 65–99)
Potassium: 3.8 mmol/L (ref 3.5–5.1)
SODIUM: 138 mmol/L (ref 135–145)
TOTAL PROTEIN: 7 g/dL (ref 6.5–8.1)
Total Bilirubin: 0.4 mg/dL (ref 0.3–1.2)

## 2014-09-06 MED ORDER — DIPHENHYDRAMINE HCL 50 MG/ML IJ SOLN
25.0000 mg | Freq: Once | INTRAMUSCULAR | Status: AC
  Administered 2014-09-06: 25 mg via INTRAVENOUS
  Filled 2014-09-06: qty 1

## 2014-09-06 MED ORDER — PACLITAXEL CHEMO INJECTION 300 MG/50ML
80.0000 mg/m2 | Freq: Once | INTRAVENOUS | Status: AC
  Administered 2014-09-06: 126 mg via INTRAVENOUS
  Filled 2014-09-06: qty 21

## 2014-09-06 MED ORDER — FAMOTIDINE IN NACL 20-0.9 MG/50ML-% IV SOLN
20.0000 mg | Freq: Once | INTRAVENOUS | Status: AC
  Administered 2014-09-06: 20 mg via INTRAVENOUS
  Filled 2014-09-06: qty 50

## 2014-09-06 MED ORDER — SODIUM CHLORIDE 0.9 % IV SOLN
Freq: Once | INTRAVENOUS | Status: AC
  Administered 2014-09-06: 15:00:00 via INTRAVENOUS
  Filled 2014-09-06: qty 1000

## 2014-09-06 MED ORDER — HEPARIN SOD (PORK) LOCK FLUSH 100 UNIT/ML IV SOLN
500.0000 [IU] | Freq: Once | INTRAVENOUS | Status: AC | PRN
  Administered 2014-09-06: 500 [IU]
  Filled 2014-09-06: qty 5

## 2014-09-06 MED ORDER — SODIUM CHLORIDE 0.9 % IJ SOLN
10.0000 mL | INTRAMUSCULAR | Status: DC | PRN
  Administered 2014-09-06: 10 mL
  Filled 2014-09-06: qty 10

## 2014-09-06 MED ORDER — SODIUM CHLORIDE 0.9 % IV SOLN
Freq: Once | INTRAVENOUS | Status: AC
  Administered 2014-09-06: 15:00:00 via INTRAVENOUS
  Filled 2014-09-06: qty 4

## 2014-09-06 MED ORDER — ACETAMINOPHEN 325 MG PO TABS
650.0000 mg | ORAL_TABLET | Freq: Once | ORAL | Status: AC
  Administered 2014-09-06: 650 mg via ORAL
  Filled 2014-09-06: qty 2

## 2014-09-10 ENCOUNTER — Other Ambulatory Visit: Payer: Self-pay | Admitting: Hematology and Oncology

## 2014-09-10 ENCOUNTER — Telehealth: Payer: Self-pay | Admitting: Hematology and Oncology

## 2014-09-10 DIAGNOSIS — N390 Urinary tract infection, site not specified: Secondary | ICD-10-CM

## 2014-09-10 MED ORDER — CIPROFLOXACIN HCL 500 MG PO TABS: 500.0000 mg | ORAL_TABLET | Freq: Two times a day (BID) | ORAL | Status: DC

## 2014-09-10 NOTE — Telephone Encounter (Signed)
  Re:  Urinary tract infection  Today the patient called about a urinary tract infection.  She has felt achy with frequent urination.  She denies any dysuria.  She previously had a UTI years ago, and felt similar symptoms.  She denied any fever.  She is currently receiving chemotherapy.  She stated that she does not do well with sulfa.  She has been on Levaquin in the past.  A prescription for Ciprofloxacin 500 mg po BID x 7 days was sent to her pharmacy.  She was instructed to contact the office if she is not improving.  She has a follow-up appointment on Tuesday, 09/20/2014.  Lequita Asal, MD

## 2014-09-12 ENCOUNTER — Encounter: Payer: Self-pay | Admitting: Oncology

## 2014-09-12 NOTE — Progress Notes (Signed)
[place] @ [facility] Telephone:([phone]  Fax:([phone]) [place]: April 18, 1971  MR#: [id number]  XLK#:[id number]  Patient Care Team: Perrin Maltese, MD as PCP - General (Internal Medicine) Robert Bellow, MD (General Surgery) Chad Cordial, PA-C as Referring Physician (Physician Assistant)  CHIEF COMPLAINT:  Chief Complaint  Patient presents with  . Follow-up    Oncology History   1. Carcinoma of right breast T1 be N1 M0 tumor stage II diagnosis in December of 2015 (right lower and outer quadrant). Needle biopsy positive of the right, Axillary lymph node (December, 2015), positive for metastatic breast carcinoma. Estrogen receptor negative.  Progesterone receptor weakly positive.  HER-2/neu receptor negative. 2. Chemotherapy with Cytoxan, Adriamycin,  followed by Taxol (neoadjuvant therapy) April 25, 2014., 3.finished 4 cycles of chemotherapy with Cytoxan and Adriamycin on 7 th  March, 2016 4.started Taxol on a weekly basis from March 29     Breast cancer   03/30/2014 Initial Diagnosis Breast cancer    Oncology Flowsheet 08/23/2014 08/30/2014 09/06/2014  Day, Cycle Day 8, Cycle 2 Day 15, Cycle 2 Day 22, Cycle 2  dexamethasone (DECADRON) IV [ 20 mg ] [ 20 mg ] [ 20 mg ]  ondansetron (ZOFRAN) IV [ 8 mg ] [ 8 mg ] [ 8 mg ]  PACLitaxel (TAXOL) IV 80 mg/m2 80 mg/m2 80 mg/m2    INTERVAL HISTORY: 44 year old lady with stage II carcinoma of breast on adjuvant chemotherapy.  Here for further evaluation for next cycle of Taxol treatment.  Tolerating treatment very well.  Intermittent tingling numbness.  Not interfering with her work.  Alopecia.  No nausea.  No vomiting.  No diarrhea.  REVIEW OF SYSTEMS:   Gen. status: Patient is in good performance status.  No chills or fever Performance status is 0 HEENT: No soreness in the mouth no difficulty swallowing Lungs: No cough or shortness of breath Cardiac: No chest pain.  No shortness of breath.  No paroxysmal  nocturnal dyspnea. GI: Normal.  No nausea.  No vomiting.  No diarrhea.  No abdominal pain Lower extremity no edema Skin: No rash Neurological system: No headache no dizziness.  No other focal symptoms.  Intermittent tingling numbness in upper and lower extremity GU: No dysuria or hematuria  As per HPI. Otherwise, a complete review of systems is negatve.  PAST MEDICAL HISTORY: Past Medical History  Diagnosis Date  . Cancer 03-24-14    Right breast, microcalcifications. ER negative, PR less than 10%, HER-2/neu not amplified.  . Melanoma Sept 2011    Left neck T1a, 0.35 mm. Treated by Mohs injury [facility].  . Breast cancer 03/2014    right    PAST SURGICAL HISTORY: Past Surgical History  Procedure Laterality Date  . Breast biopsy Right 03-24-14  . Melanoma excision  Nov 2011    neck    FAMILY HISTORY Family History  Problem Relation Age of Onset  . Cancer Mother     lung ? mid 50    Significant History/PMH:   Melanoma:    Breast Cancer:    Melanoma excision:    Breast Biopsy:   Preventive Screening:  Has patient had any of the following test? Colonscopy  Mammography  Pap Smear (1)   Last Colonoscopy: Never(1)   Last Mammography: 2015(1)   Last Pap Smear: 2015(1)   Smoking History: Smoking History Never Smoked.(1)  PFSH: Family History: noncontributory  Comments: not examinedo family history of breast cancer  Social History:  negative alcohol, negative tobacco       ADVANCED DIRECTIVES:  Patient  does not have any advance care directive  HEALTH MAINTENANCE: History  Substance Use Topics  . Smoking status: Never Smoker   . Smokeless tobacco: Never Used  . Alcohol Use: No     Comment: 3/week       Allergies  Allergen Reactions  . Sulfa Antibiotics Nausea Only    Current Outpatient Prescriptions  Medication Sig Dispense Refill  . loratadine-pseudoephedrine (CLARITIN-D 24-HOUR) 10-240 MG per 24 hr tablet Take 1 tablet by mouth daily.     . ciprofloxacin (CIPRO) 500 MG tablet Take 1 tablet (500 mg total) by mouth 2 (two) times daily. 14 tablet 0  . lidocaine-prilocaine (EMLA) cream   3  . ondansetron (ZOFRAN) 4 MG tablet Take 4 mg by mouth every 6 (six) hours as needed for nausea or vomiting.    . promethazine (PHENERGAN) 25 MG tablet Take 1 tablet (25 mg total) by mouth every 6 (six) hours as needed for nausea or vomiting. (Patient not taking: Reported on 08/30/2014) 12 tablet 0  . senna (SENOKOT) 8.6 MG TABS tablet Take 1 tablet by mouth daily as needed for mild constipation.     No current facility-administered medications for this visit.   Facility-Administered Medications Ordered in Other Visits  Medication Dose Route Frequency Provider Last Rate Last Dose  . sodium chloride 0.9 % injection 10 mL  10 mL Intracatheter PRN [name], MD   10 mL at 08/23/14 1430  . sodium chloride 0.9 % injection 10 mL  10 mL Intracatheter PRN [name], MD   10 mL at 09/06/14 1400    OBJECTIVE:  Filed Vitals:   08/30/14 1503  BP: 98/64  Pulse: 87  Temp: 96.2 F (35.7 C)     Body mass index is 20.75 kg/(m^2).    ECOG FS:0 - Asymptomatic  PHYSICAL EXAM: Goal status: Performance status is good.  Patient has not lost significant weight HEENT: No evidence of stomatitis.  And alopecia Sclera and conjunctivae :: No jaundice.   pale looking . Lungs: Air  entry equal on both sides.  No rhonchi.  No rales.  Cardiac: Heart sounds are normal.  No pericardial rub.  No murmur. Lymphatic system: Cervical, axillary, inguinal, lymph nodes not palpable GI: Abdomen is soft.  No ascites.  Liver spleen not palpable.  No tenderness.  Bowel sounds are within normal limit Lower extremity: No edema Neurological system: Higher functions, cranial nerves intact No evidence of peripheral neuropathy. Skin: No rash.  No ecchymosis.Marland Kitchen     LAB RESULTS:  Appointment on 08/30/2014  Component Date Value Ref Range Status  . WBC 08/30/2014 2.9* 3.6 -  11.0 K/uL Final  . RBC 08/30/2014 3.63* 3.80 - 5.20 MIL/uL Final  . Hemoglobin 08/30/2014 12.1  12.0 - 16.0 g/dL Final  . HCT 08/30/2014 35.0  35.0 - 47.0 % Final  . MCV 08/30/2014 96.6  80.0 - 100.0 fL Final  . MCH 08/30/2014 33.2  26.0 - 34.0 pg Final  . MCHC 08/30/2014 34.4  32.0 - 36.0 g/dL Final  . RDW 08/30/2014 13.8  11.5 - 14.5 % Final  . Platelets 08/30/2014 205  150 - 440 K/uL Final  . Neutrophils Relative % 08/30/2014 56   Final  . Neutro Abs 08/30/2014 1.6  1.4 - 6.5 K/uL Final  . Lymphocytes Relative 08/30/2014 31   Final  . Lymphs Abs 08/30/2014 0.9* 1.0 - 3.6 K/uL Final  . Monocytes  Relative 08/30/2014 8   Final  . Monocytes Absolute 08/30/2014 0.2  0.2 - 0.9 K/uL Final  . Eosinophils Relative 08/30/2014 3   Final  . Eosinophils Absolute 08/30/2014 0.1  0 - 0.7 K/uL Final  . Basophils Relative 08/30/2014 2   Final  . Basophils Absolute 08/30/2014 0.1  0 - 0.1 K/uL Final  . Sodium 08/30/2014 137  135 - 145 mmol/L Final  . Potassium 08/30/2014 3.9  3.5 - 5.1 mmol/L Final  . Chloride 08/30/2014 104  101 - 111 mmol/L Final  . CO2 08/30/2014 27  22 - 32 mmol/L Final  . Glucose, Bld 08/30/2014 116* 65 - 99 mg/dL Final  . BUN 08/30/2014 12  6 - 20 mg/dL Final  . Creatinine, Ser 08/30/2014 0.71  0.44 - 1.00 mg/dL Final  . Calcium 08/30/2014 9.0  8.9 - 10.3 mg/dL Final  . Total Protein 08/30/2014 7.5  6.5 - 8.1 g/dL Final  . Albumin 08/30/2014 4.5  3.5 - 5.0 g/dL Final  . AST 08/30/2014 59* 15 - 41 U/L Final  . ALT 08/30/2014 80* 14 - 54 U/L Final  . Alkaline Phosphatase 08/30/2014 47  38 - 126 U/L Final  . Total Bilirubin 08/30/2014 0.4  0.3 - 1.2 mg/dL Final  . GFR calc non Af Amer 08/30/2014 >60  >60 mL/min Final  . GFR calc Af Amer 08/30/2014 >60  >60 mL/min Final   Comment: (NOTE) The eGFR has been calculated using the CKD EPI equation. This calculation has not been validated in all clinical situations. eGFR's persistently <60 mL/min signify possible Chronic  Kidney Disease.   Georgiann Hahn gap 08/30/2014 6  5 - 15 Final   Performed at [facility]    Lab Results  Component Value Date   LABCA2 10.1 04/25/2014   No results found for: CA199 Lab Results  Component Value Date   CEA 1.6 03/30/2014   No results found for: PSA No results found for: CA125    ASSESSMENT: 44 year old lady with states to carcinoma breast here for continuation of Taxol chemotherapy  MEDICAL DECISION MAKING:  All the lab data has been reviewed. Grade 1 neuropathy which is resolved. Will continue Taxol 3 and reevaluate patient  Patient expressed understanding and was in agreement with this plan. She also understands that She can call clinic at any time with any questions, concerns, or complaints.    Breast cancer   Staging form: Breast, AJCC 7th Edition     Clinical: Stage IIA (T1b, N1, M0) - Signed by [name], MD on 08/01/2014   [name], MD   09/12/2014 8:23 AM

## 2014-09-13 ENCOUNTER — Inpatient Hospital Stay: Payer: BLUE CROSS/BLUE SHIELD

## 2014-09-13 ENCOUNTER — Inpatient Hospital Stay (HOSPITAL_BASED_OUTPATIENT_CLINIC_OR_DEPARTMENT_OTHER): Payer: BLUE CROSS/BLUE SHIELD | Admitting: Oncology

## 2014-09-13 VITALS — BP 109/73 | HR 92 | Resp 20

## 2014-09-13 VITALS — BP 103/70 | HR 93 | Temp 96.3°F | Resp 16 | Wt 121.9 lb

## 2014-09-13 DIAGNOSIS — Z170 Estrogen receptor positive status [ER+]: Secondary | ICD-10-CM

## 2014-09-13 DIAGNOSIS — Z79899 Other long term (current) drug therapy: Secondary | ICD-10-CM

## 2014-09-13 DIAGNOSIS — C50919 Malignant neoplasm of unspecified site of unspecified female breast: Secondary | ICD-10-CM

## 2014-09-13 DIAGNOSIS — C50911 Malignant neoplasm of unspecified site of right female breast: Secondary | ICD-10-CM

## 2014-09-13 DIAGNOSIS — C50511 Malignant neoplasm of lower-outer quadrant of right female breast: Secondary | ICD-10-CM

## 2014-09-13 DIAGNOSIS — K30 Functional dyspepsia: Secondary | ICD-10-CM

## 2014-09-13 LAB — COMPREHENSIVE METABOLIC PANEL WITH GFR
ALT: 53 U/L (ref 14–54)
AST: 46 U/L — ABNORMAL HIGH (ref 15–41)
Albumin: 4.4 g/dL (ref 3.5–5.0)
Alkaline Phosphatase: 45 U/L (ref 38–126)
Anion gap: 6 (ref 5–15)
BUN: 10 mg/dL (ref 6–20)
CO2: 27 mmol/L (ref 22–32)
Calcium: 9.2 mg/dL (ref 8.9–10.3)
Chloride: 103 mmol/L (ref 101–111)
Creatinine, Ser: 0.73 mg/dL (ref 0.44–1.00)
GFR calc Af Amer: >60 mL/min
GFR calc non Af Amer: >60 mL/min
Glucose, Bld: 132 mg/dL — ABNORMAL HIGH (ref 65–99)
Potassium: 3.7 mmol/L (ref 3.5–5.1)
Sodium: 136 mmol/L (ref 135–145)
Total Bilirubin: 0.5 mg/dL (ref 0.3–1.2)
Total Protein: 7.4 g/dL (ref 6.5–8.1)

## 2014-09-13 LAB — CBC WITH DIFFERENTIAL/PLATELET
Basophils Absolute: 0 10*3/uL (ref 0–0.1)
Basophils Relative: 2 %
Eosinophils Absolute: 0.1 10*3/uL (ref 0–0.7)
Eosinophils Relative: 2 %
HCT: 35.2 % (ref 35.0–47.0)
Hemoglobin: 12.1 g/dL (ref 12.0–16.0)
Lymphocytes Relative: 33 %
Lymphs Abs: 1 10*3/uL (ref 1.0–3.6)
MCH: 33.1 pg (ref 26.0–34.0)
MCHC: 34.3 g/dL (ref 32.0–36.0)
MCV: 96.5 fL (ref 80.0–100.0)
Monocytes Absolute: 0.3 10*3/uL (ref 0.2–0.9)
Monocytes Relative: 9 %
Neutro Abs: 1.6 10*3/uL (ref 1.4–6.5)
Neutrophils Relative %: 54 %
Platelets: 202 10*3/uL (ref 150–440)
RBC: 3.65 MIL/uL — ABNORMAL LOW (ref 3.80–5.20)
RDW: 14 % (ref 11.5–14.5)
WBC: 3 10*3/uL — ABNORMAL LOW (ref 3.6–11.0)

## 2014-09-13 MED ORDER — SODIUM CHLORIDE 0.9 % IV SOLN
Freq: Once | INTRAVENOUS | Status: AC
  Administered 2014-09-13: 15:00:00 via INTRAVENOUS
  Filled 2014-09-13: qty 250

## 2014-09-13 MED ORDER — FAMOTIDINE IN NACL 20-0.9 MG/50ML-% IV SOLN
20.0000 mg | Freq: Once | INTRAVENOUS | Status: AC
  Administered 2014-09-13: 20 mg via INTRAVENOUS
  Filled 2014-09-13: qty 50

## 2014-09-13 MED ORDER — HEPARIN SOD (PORK) LOCK FLUSH 100 UNIT/ML IV SOLN
500.0000 [IU] | Freq: Once | INTRAVENOUS | Status: AC | PRN
  Administered 2014-09-13: 500 [IU]
  Filled 2014-09-13: qty 5

## 2014-09-13 MED ORDER — OMEPRAZOLE 20 MG PO CPDR: 20.0000 mg | DELAYED_RELEASE_CAPSULE | Freq: Two times a day (BID) | ORAL | Status: DC

## 2014-09-13 MED ORDER — SODIUM CHLORIDE 0.9 % IJ SOLN
10.0000 mL | INTRAMUSCULAR | Status: DC | PRN
  Administered 2014-09-13: 10 mL
  Filled 2014-09-13: qty 10

## 2014-09-13 MED ORDER — PACLITAXEL CHEMO INJECTION 300 MG/50ML
80.0000 mg/m2 | Freq: Once | INTRAVENOUS | Status: AC
  Administered 2014-09-13: 126 mg via INTRAVENOUS
  Filled 2014-09-13: qty 21

## 2014-09-13 MED ORDER — DIPHENHYDRAMINE HCL 50 MG/ML IJ SOLN
25.0000 mg | Freq: Once | INTRAMUSCULAR | Status: AC
  Administered 2014-09-13: 25 mg via INTRAVENOUS
  Filled 2014-09-13: qty 1

## 2014-09-13 MED ORDER — SODIUM CHLORIDE 0.9 % IV SOLN
Freq: Once | INTRAVENOUS | Status: AC
  Administered 2014-09-13: 15:00:00 via INTRAVENOUS
  Filled 2014-09-13: qty 4

## 2014-09-13 NOTE — Progress Notes (Signed)
Patient has never smoked.  Does not have living will. 

## 2014-09-18 ENCOUNTER — Encounter: Payer: Self-pay | Admitting: Oncology

## 2014-09-18 NOTE — Progress Notes (Signed)
[place] @ [facility] Telephone:(336) [phone]  Fax:(336) [place]: [birth year]  MR#: [id number]  [id number]#:[id number]  Patient Care Team: Perrin Maltese, MD as PCP - General (Internal Medicine) Robert Bellow, MD (General Surgery) Chad Cordial, PA-C as Referring Physician (Physician Assistant)  CHIEF COMPLAINT:  Chief Complaint  Patient presents with  . Follow-up    Oncology History   1. Carcinoma of right breast T1 be N1 M0 tumor stage II diagnosis in December of 2015 (right lower and outer quadrant). Needle biopsy positive of the right, Axillary lymph node (December, 2015), positive for metastatic breast carcinoma. Estrogen receptor negative.  Progesterone receptor weakly positive.  HER-2/neu receptor negative. 2. Chemotherapy with Cytoxan, Adriamycin,  followed by Taxol (neoadjuvant therapy) April 25, 2014., 3.finished 4 cycles of chemotherapy with Cytoxan and Adriamycin on 7 th  March, 2016 4.started Taxol on a weekly basis from March 29     Breast cancer   03/30/2014 Initial Diagnosis Breast cancer    Oncology Flowsheet 08/23/2014 08/30/2014 09/06/2014 09/13/2014  Day, Cycle Day 8, Cycle 2 Day 15, Cycle 2 Day 22, Cycle 2 Day 1, Cycle 3  dexamethasone (DECADRON) IV [ 20 mg ] [ 20 mg ] [ 20 mg ] [ 20 mg ]  ondansetron (ZOFRAN) IV [ 8 mg ] [ 8 mg ] [ 8 mg ] [ 8 mg ]  PACLitaxel (TAXOL) IV 80 mg/m2 80 mg/m2 80 mg/m2 80 mg/m2    INTERVAL HISTORY: 44 year old lady with stage II carcinoma of breast on adjuvant chemotherapy.  Here for further evaluation for next cycle of Taxol treatment.  Tolerating treatment very well.  Intermittent tingling numbness.  Not interfering with her work.  Alopecia.  No nausea.  No vomiting.  No diarrhea.  Sep 13, 2014 44 years old lady is here for ongoing evaluation and treatment consideration and continuation of Taxol chemotherapy.\No tingling.  No numbness. Appetite has been stable. alopecia REVIEW OF SYSTEMS:   Gen. status: Patient  is in good performance status.  No chills or fever Performance status is 0 HEENT: No soreness in the mouth no difficulty swallowing Lungs: No cough or shortness of breath Cardiac: No chest pain.  No shortness of breath.  No paroxysmal nocturnal dyspnea. GI: Normal.  No nausea.  No vomiting.  No diarrhea.  No abdominal pain Lower extremity no edema Skin: No rash Neurological system: No headache no dizziness.  No other focal symptoms.  Intermittent tingling numbness in upper and lower extremity GU: No dysuria or hematuria  As per HPI. Otherwise, a complete review of systems is negatve.  PAST MEDICAL HISTORY: Past Medical History  Diagnosis Date  . Cancer 03-24-14    Right breast, microcalcifications. ER negative, PR less than 10%, HER-2/neu not amplified.  . Melanoma Sept 2011    Left neck T1a, 0.35 mm. Treated by Mohs injury [facility].  . Breast cancer 03/2014    right    PAST SURGICAL HISTORY: Past Surgical History  Procedure Laterality Date  . Breast biopsy Right 03-24-14  . Melanoma excision  Nov 2011    neck    FAMILY HISTORY Family History  Problem Relation Age of Onset  . Cancer Mother     lung ? mid 50    Significant History/PMH:   Melanoma:    Breast Cancer:    Melanoma excision:    Breast Biopsy:   Preventive Screening:  Has patient had any of the following test? Colonscopy  Mammography  Pap Smear (1)  Last Colonoscopy: Never(1)   Last Mammography: 2015(1)   Last Pap Smear: 2015(1)   Smoking History: Smoking History Never Smoked.(1)  PFSH: Family History: noncontributory  Comments: not examinedo family history of breast cancer  Social History: negative alcohol, negative tobacco       ADVANCED DIRECTIVES:  Patient  does not have any advance care directive  HEALTH MAINTENANCE: History  Substance Use Topics  . Smoking status: Never Smoker   . Smokeless tobacco: Never Used  . Alcohol Use: No     Comment: 3/week        Allergies  Allergen Reactions  . Sulfa Antibiotics Nausea Only    Current Outpatient Prescriptions  Medication Sig Dispense Refill  . ciprofloxacin (CIPRO) 500 MG tablet Take 1 tablet (500 mg total) by mouth 2 (two) times daily. 14 tablet 0  . lidocaine-prilocaine (EMLA) cream   3  . loratadine-pseudoephedrine (CLARITIN-D 24-HOUR) 10-240 MG per 24 hr tablet Take 1 tablet by mouth daily.    . ondansetron (ZOFRAN) 4 MG tablet Take 4 mg by mouth every 6 (six) hours as needed for nausea or vomiting.    . senna (SENOKOT) 8.6 MG TABS tablet Take 1 tablet by mouth daily as needed for mild constipation.    Marland Kitchen omeprazole (PRILOSEC) 20 MG capsule Take 1 capsule (20 mg total) by mouth 2 (two) times daily before a meal. 60 capsule 3  . promethazine (PHENERGAN) 25 MG tablet Take 1 tablet (25 mg total) by mouth every 6 (six) hours as needed for nausea or vomiting. (Patient not taking: Reported on 08/30/2014) 12 tablet 0   No current facility-administered medications for this visit.   Facility-Administered Medications Ordered in Other Visits  Medication Dose Route Frequency Provider Last Rate Last Dose  . sodium chloride 0.9 % injection 10 mL  10 mL Intracatheter PRN [name], MD   10 mL at 08/23/14 1430  . sodium chloride 0.9 % injection 10 mL  10 mL Intracatheter PRN [name], MD   10 mL at 09/06/14 1400  . sodium chloride 0.9 % injection 10 mL  10 mL Intracatheter PRN [name], MD   10 mL at 09/13/14 1430    OBJECTIVE:  Filed Vitals:   09/13/14 1345  BP: 103/70  Pulse: 93  Temp: 96.3 F (35.7 C)  Resp: 16     Body mass index is 20.94 kg/(m^2).    ECOG FS:0 - Asymptomatic  PHYSICAL EXAM: Goal status: Performance status is good.  Patient has not lost significant weight HEENT: No evidence of stomatitis.  And alopecia Sclera and conjunctivae :: No jaundice.   pale looking . Lungs: Air  entry equal on both sides.  No rhonchi.  No rales.  Cardiac: Heart sounds are normal.   No pericardial rub.  No murmur. Lymphatic system: Cervical, axillary, inguinal, lymph nodes not palpable GI: Abdomen is soft.  No ascites.  Liver spleen not palpable.  No tenderness.  Bowel sounds are within normal limit Lower extremity: No edema Neurological system: Higher functions, cranial nerves intact No evidence of peripheral neuropathy. Skin: No rash.  No ecchymosis.Marland Kitchen     LAB RESULTS:  Appointment on 09/13/2014  Component Date Value Ref Range Status  . WBC 09/13/2014 3.0* 3.6 - 11.0 K/uL Final   A-LINE  . RBC 09/13/2014 3.65* 3.80 - 5.20 MIL/uL Final  . Hemoglobin 09/13/2014 12.1  12.0 - 16.0 g/dL Final  . HCT 09/13/2014 35.2  35.0 - 47.0 % Final  . MCV 09/13/2014 96.5  80.0 - 100.0 fL Final  . MCH 09/13/2014 33.1  26.0 - 34.0 pg Final  . MCHC 09/13/2014 34.3  32.0 - 36.0 g/dL Final  . RDW 09/13/2014 14.0  11.5 - 14.5 % Final  . Platelets 09/13/2014 202  150 - 440 K/uL Final  . Neutrophils Relative % 09/13/2014 54   Final  . Neutro Abs 09/13/2014 1.6  1.4 - 6.5 K/uL Final  . Lymphocytes Relative 09/13/2014 33   Final  . Lymphs Abs 09/13/2014 1.0  1.0 - 3.6 K/uL Final  . Monocytes Relative 09/13/2014 9   Final  . Monocytes Absolute 09/13/2014 0.3  0.2 - 0.9 K/uL Final  . Eosinophils Relative 09/13/2014 2   Final  . Eosinophils Absolute 09/13/2014 0.1  0 - 0.7 K/uL Final  . Basophils Relative 09/13/2014 2   Final  . Basophils Absolute 09/13/2014 0.0  0 - 0.1 K/uL Final  . Sodium 09/13/2014 136  135 - 145 mmol/L Final  . Potassium 09/13/2014 3.7  3.5 - 5.1 mmol/L Final  . Chloride 09/13/2014 103  101 - 111 mmol/L Final  . CO2 09/13/2014 27  22 - 32 mmol/L Final  . Glucose, Bld 09/13/2014 132* 65 - 99 mg/dL Final  . BUN 09/13/2014 10  6 - 20 mg/dL Final  . Creatinine, Ser 09/13/2014 0.73  0.44 - 1.00 mg/dL Final  . Calcium 09/13/2014 9.2  8.9 - 10.3 mg/dL Final  . Total Protein 09/13/2014 7.4  6.5 - 8.1 g/dL Final  . Albumin 09/13/2014 4.4  3.5 - 5.0 g/dL Final  . AST  09/13/2014 46* 15 - 41 U/L Final  . ALT 09/13/2014 53  14 - 54 U/L Final  . Alkaline Phosphatase 09/13/2014 45  38 - 126 U/L Final  . Total Bilirubin 09/13/2014 0.5  0.3 - 1.2 mg/dL Final  . GFR calc non Af Amer 09/13/2014 >60  >60 mL/min Final  . GFR calc Af Amer 09/13/2014 >60  >60 mL/min Final   Comment: (NOTE) The eGFR has been calculated using the CKD EPI equation. This calculation has not been validated in all clinical situations. eGFR's persistently <60 mL/min signify possible Chronic Kidney Disease.   . Anion gap 09/13/2014 6  5 - 15 Final    Lab Results  Component Value Date   LABCA2 10.1 04/25/2014   No results found for: CA199 Lab Results  Component Value Date   CEA 1.6 03/30/2014   No results found for: PSA No results found for: CA125    ASSESSMENT: 44 year old lady with states to carcinoma breast here for continuation of Taxol chemotherapy  MEDICAL DECISION MAKING:  All the lab data has been reviewed. Grade 1 neuropathy which is resolved. Will continue Taxol 3 and reevaluate patient After patient finished 12th cycles of chemotherapy she would be evaluated for definitive local surgery by surgical evaluation by Dr. Bary Castilla..   Patient expressed understanding and was in agreement with this plan. She also understands that She can call clinic at any time with any questions, concerns, or complaints.    Breast cancer   Staging form: Breast, AJCC 7th Edition     Clinical: Stage IIA (T1b, N1, M0) - Signed by [name], MD on 08/01/2014   [name], MD   09/18/2014 10:37 PM

## 2014-09-20 ENCOUNTER — Inpatient Hospital Stay: Payer: BLUE CROSS/BLUE SHIELD

## 2014-09-20 VITALS — BP 104/69 | HR 84 | Temp 98.9°F | Resp 18

## 2014-09-20 DIAGNOSIS — C50911 Malignant neoplasm of unspecified site of right female breast: Secondary | ICD-10-CM

## 2014-09-20 DIAGNOSIS — C50919 Malignant neoplasm of unspecified site of unspecified female breast: Secondary | ICD-10-CM

## 2014-09-20 DIAGNOSIS — C50511 Malignant neoplasm of lower-outer quadrant of right female breast: Secondary | ICD-10-CM | POA: Diagnosis not present

## 2014-09-20 LAB — CBC WITH DIFFERENTIAL/PLATELET
BASOS PCT: 1 %
Basophils Absolute: 0 10*3/uL (ref 0–0.1)
EOS ABS: 0.1 10*3/uL (ref 0–0.7)
Eosinophils Relative: 3 %
HEMATOCRIT: 34.1 % — AB (ref 35.0–47.0)
HEMOGLOBIN: 11.6 g/dL — AB (ref 12.0–16.0)
LYMPHS ABS: 1 10*3/uL (ref 1.0–3.6)
Lymphocytes Relative: 36 %
MCH: 32.9 pg (ref 26.0–34.0)
MCHC: 34.1 g/dL (ref 32.0–36.0)
MCV: 96.5 fL (ref 80.0–100.0)
MONOS PCT: 10 %
Monocytes Absolute: 0.3 10*3/uL (ref 0.2–0.9)
NEUTROS ABS: 1.4 10*3/uL (ref 1.4–6.5)
Neutrophils Relative %: 50 %
PLATELETS: 217 10*3/uL (ref 150–440)
RBC: 3.53 MIL/uL — AB (ref 3.80–5.20)
RDW: 14.1 % (ref 11.5–14.5)
WBC: 2.8 10*3/uL — AB (ref 3.6–11.0)

## 2014-09-20 MED ORDER — DIPHENHYDRAMINE HCL 50 MG/ML IJ SOLN
25.0000 mg | Freq: Once | INTRAMUSCULAR | Status: AC
  Administered 2014-09-20: 25 mg via INTRAVENOUS
  Filled 2014-09-20: qty 1

## 2014-09-20 MED ORDER — SODIUM CHLORIDE 0.9 % IJ SOLN
10.0000 mL | INTRAMUSCULAR | Status: DC | PRN
Start: 2014-09-20 — End: 2014-09-20
  Administered 2014-09-20: 10 mL
  Filled 2014-09-20: qty 10

## 2014-09-20 MED ORDER — SODIUM CHLORIDE 0.9 % IV SOLN
Freq: Once | INTRAVENOUS | Status: AC
  Administered 2014-09-20: 15:00:00 via INTRAVENOUS
  Filled 2014-09-20: qty 4

## 2014-09-20 MED ORDER — HEPARIN SOD (PORK) LOCK FLUSH 100 UNIT/ML IV SOLN
500.0000 [IU] | Freq: Once | INTRAVENOUS | Status: AC | PRN
  Administered 2014-09-20: 500 [IU]
  Filled 2014-09-20: qty 5

## 2014-09-20 MED ORDER — PACLITAXEL CHEMO INJECTION 300 MG/50ML
80.0000 mg/m2 | Freq: Once | INTRAVENOUS | Status: AC
  Administered 2014-09-20: 126 mg via INTRAVENOUS
  Filled 2014-09-20: qty 21

## 2014-09-20 MED ORDER — SODIUM CHLORIDE 0.9 % IV SOLN
Freq: Once | INTRAVENOUS | Status: AC
  Administered 2014-09-20: 15:00:00 via INTRAVENOUS
  Filled 2014-09-20: qty 1000

## 2014-09-20 MED ORDER — FAMOTIDINE IN NACL 20-0.9 MG/50ML-% IV SOLN
20.0000 mg | Freq: Once | INTRAVENOUS | Status: AC
  Administered 2014-09-20: 20 mg via INTRAVENOUS
  Filled 2014-09-20: qty 50

## 2014-09-20 NOTE — Progress Notes (Signed)
ANC: 1400. MD, Dr. Oliva Bustard, notified via telephone. Per MD, Dr. Oliva Bustard, order: proceed with chemotherapy treatment today and do not give/administer Tylenol pre-medication that is in the treatment plan.

## 2014-09-27 ENCOUNTER — Inpatient Hospital Stay: Payer: BLUE CROSS/BLUE SHIELD | Attending: Oncology

## 2014-09-27 ENCOUNTER — Inpatient Hospital Stay: Payer: BLUE CROSS/BLUE SHIELD

## 2014-09-27 ENCOUNTER — Inpatient Hospital Stay (HOSPITAL_BASED_OUTPATIENT_CLINIC_OR_DEPARTMENT_OTHER): Payer: BLUE CROSS/BLUE SHIELD | Admitting: Oncology

## 2014-09-27 ENCOUNTER — Encounter: Payer: Self-pay | Admitting: Oncology

## 2014-09-27 VITALS — BP 102/69 | HR 93 | Temp 95.9°F | Wt 123.9 lb

## 2014-09-27 DIAGNOSIS — Z79899 Other long term (current) drug therapy: Secondary | ICD-10-CM

## 2014-09-27 DIAGNOSIS — C50911 Malignant neoplasm of unspecified site of right female breast: Secondary | ICD-10-CM

## 2014-09-27 DIAGNOSIS — Z5111 Encounter for antineoplastic chemotherapy: Secondary | ICD-10-CM | POA: Diagnosis not present

## 2014-09-27 DIAGNOSIS — C50511 Malignant neoplasm of lower-outer quadrant of right female breast: Secondary | ICD-10-CM | POA: Diagnosis present

## 2014-09-27 DIAGNOSIS — C50919 Malignant neoplasm of unspecified site of unspecified female breast: Secondary | ICD-10-CM

## 2014-09-27 DIAGNOSIS — Z171 Estrogen receptor negative status [ER-]: Secondary | ICD-10-CM

## 2014-09-27 LAB — CBC WITH DIFFERENTIAL/PLATELET
Basophils Absolute: 0 10*3/uL (ref 0–0.1)
Basophils Relative: 1 %
Eosinophils Absolute: 0.1 10*3/uL (ref 0–0.7)
Eosinophils Relative: 2 %
HCT: 32.8 % — ABNORMAL LOW (ref 35.0–47.0)
Hemoglobin: 11.1 g/dL — ABNORMAL LOW (ref 12.0–16.0)
LYMPHS ABS: 1 10*3/uL (ref 1.0–3.6)
Lymphocytes Relative: 28 %
MCH: 32.8 pg (ref 26.0–34.0)
MCHC: 34 g/dL (ref 32.0–36.0)
MCV: 96.7 fL (ref 80.0–100.0)
MONO ABS: 0.3 10*3/uL (ref 0.2–0.9)
MONOS PCT: 10 %
NEUTROS PCT: 59 %
Neutro Abs: 2.1 10*3/uL (ref 1.4–6.5)
Platelets: 227 10*3/uL (ref 150–440)
RBC: 3.39 MIL/uL — ABNORMAL LOW (ref 3.80–5.20)
RDW: 13.8 % (ref 11.5–14.5)
WBC: 3.5 10*3/uL — ABNORMAL LOW (ref 3.6–11.0)

## 2014-09-27 LAB — COMPREHENSIVE METABOLIC PANEL
ALBUMIN: 4.1 g/dL (ref 3.5–5.0)
ALK PHOS: 46 U/L (ref 38–126)
ALT: 36 U/L (ref 14–54)
ANION GAP: 10 (ref 5–15)
AST: 30 U/L (ref 15–41)
BUN: 11 mg/dL (ref 6–20)
CALCIUM: 9.5 mg/dL (ref 8.9–10.3)
CO2: 28 mmol/L (ref 22–32)
Chloride: 103 mmol/L (ref 101–111)
Creatinine, Ser: 0.71 mg/dL (ref 0.44–1.00)
GFR calc Af Amer: >60 mL/min (ref >60)
Glucose, Bld: 94 mg/dL (ref 65–99)
POTASSIUM: 3.7 mmol/L (ref 3.5–5.1)
Sodium: 141 mmol/L (ref 135–145)
TOTAL PROTEIN: 6.9 g/dL (ref 6.5–8.1)
Total Bilirubin: 0.5 mg/dL (ref 0.3–1.2)

## 2014-09-27 MED ORDER — HEPARIN SOD (PORK) LOCK FLUSH 100 UNIT/ML IV SOLN
500.0000 [IU] | Freq: Once | INTRAVENOUS | Status: AC | PRN
  Administered 2014-09-27: 500 [IU]

## 2014-09-27 MED ORDER — PACLITAXEL CHEMO INJECTION 300 MG/50ML
80.0000 mg/m2 | Freq: Once | INTRAVENOUS | Status: AC
  Administered 2014-09-27: 126 mg via INTRAVENOUS
  Filled 2014-09-27: qty 21

## 2014-09-27 MED ORDER — SODIUM CHLORIDE 0.9 % IV SOLN
Freq: Once | INTRAVENOUS | Status: AC
  Administered 2014-09-27: 15:00:00 via INTRAVENOUS
  Filled 2014-09-27: qty 4

## 2014-09-27 MED ORDER — ACETAMINOPHEN 325 MG PO TABS
650.0000 mg | ORAL_TABLET | Freq: Once | ORAL | Status: AC
  Administered 2014-09-27: 650 mg via ORAL
  Filled 2014-09-27: qty 2

## 2014-09-27 MED ORDER — SODIUM CHLORIDE 0.9 % IV SOLN
Freq: Once | INTRAVENOUS | Status: AC
  Administered 2014-09-27: 15:00:00 via INTRAVENOUS
  Filled 2014-09-27: qty 250

## 2014-09-27 MED ORDER — DIPHENHYDRAMINE HCL 50 MG/ML IJ SOLN
25.0000 mg | Freq: Once | INTRAMUSCULAR | Status: AC
  Administered 2014-09-27: 25 mg via INTRAVENOUS
  Filled 2014-09-27: qty 1

## 2014-09-27 MED ORDER — FAMOTIDINE IN NACL 20-0.9 MG/50ML-% IV SOLN
20.0000 mg | Freq: Once | INTRAVENOUS | Status: AC
  Administered 2014-09-27: 20 mg via INTRAVENOUS
  Filled 2014-09-27: qty 50

## 2014-09-27 MED ORDER — SODIUM CHLORIDE 0.9 % IJ SOLN
10.0000 mL | INTRAMUSCULAR | Status: DC | PRN
  Administered 2014-09-27: 10 mL
  Filled 2014-09-27: qty 10

## 2014-09-27 NOTE — Progress Notes (Signed)
Patient never smoked.  Patient does not have living will.

## 2014-09-27 NOTE — Addendum Note (Signed)
Addended by: Telford Nab on: 09/27/2014 02:39 PM   Modules accepted: Orders

## 2014-09-27 NOTE — Progress Notes (Signed)
[place] @ [facility] Telephone:(336) [phone]  Fax:(336) [phone]     Kiara Grant [place] OB: 1970/08/09  MR#: [id number]  YYQ#:[id number]  Patient Care Team: Perrin Maltese, MD as PCP - General (Internal Medicine) Robert Bellow, MD (General Surgery) Chad Cordial, PA-C as Referring Physician (Physician Assistant)  CHIEF COMPLAINT:  Chief Complaint  Patient presents with  . Follow-up    Oncology History   1. Carcinoma of right breast T1 be N1 M0 tumor stage II diagnosis in December of 2015 (right lower and outer quadrant). Needle biopsy positive of the right, Axillary lymph node (December, 2015), positive for metastatic breast carcinoma. Estrogen receptor negative.  Progesterone receptor weakly positive.  HER-2/neu receptor negative. 2. Chemotherapy with Cytoxan, Adriamycin,  followed by Taxol (neoadjuvant therapy) April 25, 2014., 3.finished 4 cycles of chemotherapy with Cytoxan and Adriamycin on 7 th  March, 2016 4.started Taxol on a weekly basis from March 29 5.  Patient will finish weekly Taxol on 13th of June, 2016 And will be referred to surgeon for possibility of lumpectomy and sentinel lymph node evaluation     Breast cancer   03/30/2014 Initial Diagnosis Breast cancer    Oncology Flowsheet 08/23/2014 08/30/2014 09/06/2014 09/13/2014 09/20/2014  Day, Cycle Day 8, Cycle 2 Day 15, Cycle 2 Day 22, Cycle 2 Day 1, Cycle 3 Day 8, Cycle 3  dexamethasone (DECADRON) IV [ 20 mg ] [ 20 mg ] [ 20 mg ] [ 20 mg ] [ 20 mg ]  ondansetron (ZOFRAN) IV [ 8 mg ] [ 8 mg ] [ 8 mg ] [ 8 mg ] [ 8 mg ]  PACLitaxel (TAXOL) IV 80 mg/m2 80 mg/m2 80 mg/m2 80 mg/m2 80 mg/m2    INTERVAL HISTORY: 44 year old lady with stage II carcinoma of breast on adjuvant chemotherapy.  Here for further evaluation for next cycle of Taxol treatment.  Tolerating treatment very well.  Intermittent tingling numbness.  Not interfering with her work.  Alopecia.  No nausea.  No vomiting.  No diarrhea.  September 27, 2014    Patient is here for ongoing evaluation regarding last 2 cycles of chemotherapy.  Had intermittent tingling.  But no difficulty buttoning and buttoning shirt or holding things.   Had noticed some nasal changes.   Recently was exposed to poison oak  No chills.  No fever.  alopecia REVIEW OF SYSTEMS:   Gen. status: Patient is in good performance status.  No chills or fever Performance status is 0 HEENT: No soreness in the mouth no difficulty swallowing Lungs: No cough or shortness of breath Cardiac: No chest pain.  No shortness of breath.  No paroxysmal nocturnal dyspnea. GI: Normal.  No nausea.  No vomiting.  No diarrhea.  No abdominal pain Lower extremity no edema Skin: No rash Neurological system: No headache no dizziness.  No other focal symptoms.  Intermittent tingling numbness in upper and lower extremity GU: No dysuria or hematuria  As per HPI. Otherwise, a complete review of systems is negatve.  PAST MEDICAL HISTORY: Past Medical History  Diagnosis Date  . Cancer 03-24-14    Right breast, microcalcifications. ER negative, PR less than 10%, HER-2/neu not amplified.  . Melanoma Sept 2011    Left neck T1a, 0.35 mm. Treated by Mohs injury [facility].  . Breast cancer 03/2014    right    PAST SURGICAL HISTORY: Past Surgical History  Procedure Laterality Date  . Breast biopsy Right 03-24-14  . Melanoma excision  Nov 2011  neck    FAMILY HISTORY Family History  Problem Relation Age of Onset  . Cancer Mother     lung ? mid 50    Significant History/PMH:   Melanoma:    Breast Cancer:    Melanoma excision:    Breast Biopsy:   Preventive Screening:  Has patient had any of the following test? Colonscopy  Mammography  Pap Smear (1)   Last Colonoscopy: Never(1)   Last Mammography: 2015(1)   Last Pap Smear: 2015(1)   Smoking History: Smoking History Never Smoked.(1)  PFSH: Family History: noncontributory  Comments: not examinedo family history of  breast cancer  Social History: negative alcohol, negative tobacco       ADVANCED DIRECTIVES:  Patient  does not have any advance care directive  HEALTH MAINTENANCE: History  Substance Use Topics  . Smoking status: Never Smoker   . Smokeless tobacco: Never Used  . Alcohol Use: No     Comment: 3/week       Allergies  Allergen Reactions  . Sulfa Antibiotics Nausea Only    Current Outpatient Prescriptions  Medication Sig Dispense Refill  . lidocaine-prilocaine (EMLA) cream   3  . loratadine-pseudoephedrine (CLARITIN-D 24-HOUR) 10-240 MG per 24 hr tablet Take 1 tablet by mouth daily.    Marland Kitchen senna (SENOKOT) 8.6 MG TABS tablet Take 1 tablet by mouth daily as needed for mild constipation.    . ciprofloxacin (CIPRO) 500 MG tablet Take 1 tablet (500 mg total) by mouth 2 (two) times daily. (Patient not taking: Reported on 09/27/2014) 14 tablet 0  . omeprazole (PRILOSEC) 20 MG capsule Take 1 capsule (20 mg total) by mouth 2 (two) times daily before a meal. (Patient not taking: Reported on 09/27/2014) 60 capsule 3  . ondansetron (ZOFRAN) 4 MG tablet Take 4 mg by mouth every 6 (six) hours as needed for nausea or vomiting.    . promethazine (PHENERGAN) 25 MG tablet Take 1 tablet (25 mg total) by mouth every 6 (six) hours as needed for nausea or vomiting. (Patient not taking: Reported on 08/30/2014) 12 tablet 0   No current facility-administered medications for this visit.   Facility-Administered Medications Ordered in Other Visits  Medication Dose Route Frequency Provider Last Rate Last Dose  . sodium chloride 0.9 % injection 10 mL  10 mL Intracatheter PRN [name], MD   10 mL at 08/23/14 1430  . sodium chloride 0.9 % injection 10 mL  10 mL Intracatheter PRN [name], MD   10 mL at 09/06/14 1400  . sodium chloride 0.9 % injection 10 mL  10 mL Intracatheter PRN [name], MD   10 mL at 09/13/14 1430    OBJECTIVE:  Filed Vitals:   09/27/14 1408  BP: 102/69  Pulse: 93  Temp: 95.9  F (35.5 C)     Body mass index is 21.28 kg/(m^2).    ECOG FS:0 - Asymptomatic  PHYSICAL EXAM: Goal status: Performance status is good.  Patient has not lost significant weight HEENT: No evidence of stomatitis.  And alopecia Sclera and conjunctivae :: No jaundice.   pale looking . Lungs: Air  entry equal on both sides.  No rhonchi.  No rales.  Cardiac: Heart sounds are normal.  No pericardial rub.  No murmur. Lymphatic system: Cervical, axillary, inguinal, lymph nodes not palpable GI: Abdomen is soft.  No ascites.  Liver spleen not palpable.  No tenderness.  Bowel sounds are within normal limit Lower extremity: No edema Neurological system: Higher functions, cranial nerves  intact No evidence of peripheral neuropathy. Skin: No rash.  No ecchymosis.Marland Kitchen     LAB RESULTS:  Appointment on 09/27/2014  Component Date Value Ref Range Status  . WBC 09/27/2014 3.5* 3.6 - 11.0 K/uL Final   A-LINE DRAW  . RBC 09/27/2014 3.39* 3.80 - 5.20 MIL/uL Final  . Hemoglobin 09/27/2014 11.1* 12.0 - 16.0 g/dL Final  . HCT 09/27/2014 32.8* 35.0 - 47.0 % Final  . MCV 09/27/2014 96.7  80.0 - 100.0 fL Final  . MCH 09/27/2014 32.8  26.0 - 34.0 pg Final  . MCHC 09/27/2014 34.0  32.0 - 36.0 g/dL Final  . RDW 09/27/2014 13.8  11.5 - 14.5 % Final  . Platelets 09/27/2014 227  150 - 440 K/uL Final  . Neutrophils Relative % 09/27/2014 59   Final  . Neutro Abs 09/27/2014 2.1  1.4 - 6.5 K/uL Final  . Lymphocytes Relative 09/27/2014 28   Final  . Lymphs Abs 09/27/2014 1.0  1.0 - 3.6 K/uL Final  . Monocytes Relative 09/27/2014 10   Final  . Monocytes Absolute 09/27/2014 0.3  0.2 - 0.9 K/uL Final  . Eosinophils Relative 09/27/2014 2   Final  . Eosinophils Absolute 09/27/2014 0.1  0 - 0.7 K/uL Final  . Basophils Relative 09/27/2014 1   Final  . Basophils Absolute 09/27/2014 0.0  0 - 0.1 K/uL Final    Lab Results  Component Value Date   LABCA2 10.1 04/25/2014   No results found for: CA199 Lab Results    Component Value Date   CEA 1.6 03/30/2014   No results found for: PSA No results found for: CA125    ASSESSMENT: 44 year old lady with states to carcinoma breast here for continuation of Taxol chemotherapy  MEDICAL DECISION MAKING:  All the lab data has been reviewed. Grade 1 neuropathy which is resolved. Will continue Taxol 3 and reevaluate patient  After patient finished 12th cycles of chemotherapy she would be evaluated for definitive local surgery by surgical evaluation by Dr. Bary Castilla..   Patient expressed understanding and was in agreement with this plan. She also understands that She can call clinic at any time with any questions, concerns, or complaints.    Breast cancer   Staging form: Breast, AJCC 7th Edition     Clinical: Stage IIA (T1b, N1, M0) - Signed by [name], MD on 08/01/2014   [name], MD   09/27/2014 2:26 PM

## 2014-09-28 ENCOUNTER — Encounter: Payer: Self-pay | Admitting: General Surgery

## 2014-09-28 ENCOUNTER — Other Ambulatory Visit: Payer: BLUE CROSS/BLUE SHIELD

## 2014-09-28 ENCOUNTER — Ambulatory Visit: Payer: Self-pay | Admitting: General Surgery

## 2014-09-28 ENCOUNTER — Ambulatory Visit (INDEPENDENT_AMBULATORY_CARE_PROVIDER_SITE_OTHER): Payer: BLUE CROSS/BLUE SHIELD | Admitting: General Surgery

## 2014-09-28 VITALS — BP 108/60 | HR 76 | Resp 14 | Ht 64.0 in | Wt 122.0 lb

## 2014-09-28 DIAGNOSIS — C50911 Malignant neoplasm of unspecified site of right female breast: Secondary | ICD-10-CM

## 2014-09-28 DIAGNOSIS — R110 Nausea: Secondary | ICD-10-CM | POA: Diagnosis not present

## 2014-09-28 DIAGNOSIS — Z9889 Other specified postprocedural states: Secondary | ICD-10-CM | POA: Diagnosis not present

## 2014-09-28 MED ORDER — SCOPOLAMINE 1 MG/3DAYS TD PT72: 1.0000 | MEDICATED_PATCH | TRANSDERMAL | Status: DC

## 2014-09-28 NOTE — Progress Notes (Addendum)
Patient ID: Kiara Grant, female   DOB: 1970/11/03, 44 y.o.   MRN: [id number]  Chief Complaint  Patient presents with  . Pre-op Exam    HPI Kiara Grant is a 44 y.o. female.  Here today to discuss having breast surgery, as she is soon to complete her neo-adjuvant treatment. She has one more chemotherapy treatment planned for next week on the 14th.    HPI  Past Medical History  Diagnosis Date  . Cancer 03-24-14    Right breast, microcalcifications. ER negative, PR less than 10%, HER-2/neu not amplified.  . Melanoma Sept 2011    Left neck T1a, 0.35 mm. Treated by Mohs injury [facility].  . Breast cancer 03/2014    Right, T1b, N1 prior to neo-adjuvant chemotherapy. ER negative, PR less than 10%, HER-2/neu not overexpressed.    Past Surgical History  Procedure Laterality Date  . Breast biopsy Right 03-24-14  . Melanoma excision  Nov 2011    neck  . Portacath placement  Dec 2015    Family History  Problem Relation Age of Onset  . Cancer Mother     lung ? mid 73    Social History History  Substance Use Topics  . Smoking status: Never Smoker   . Smokeless tobacco: Never Used  . Alcohol Use: No     Comment: 3/week    Allergies  Allergen Reactions  . Sulfa Antibiotics Nausea Only    Current Outpatient Prescriptions  Medication Sig Dispense Refill  . lidocaine-prilocaine (EMLA) cream   3  . loratadine-pseudoephedrine (CLARITIN-D 24-HOUR) 10-240 MG per 24 hr tablet Take 1 tablet by mouth as needed.     Marland Kitchen omeprazole (PRILOSEC) 20 MG capsule Take 1 capsule (20 mg total) by mouth 2 (two) times daily before a meal. 60 capsule 3  . ondansetron (ZOFRAN) 4 MG tablet Take 4 mg by mouth every 6 (six) hours as needed for nausea or vomiting.    . promethazine (PHENERGAN) 25 MG tablet Take 1 tablet (25 mg total) by mouth every 6 (six) hours as needed for nausea or vomiting. 12 tablet 0  . senna (SENOKOT) 8.6 MG TABS tablet Take 1 tablet by mouth daily as needed for  mild constipation.    Marland Kitchen scopolamine (TRANSDERM-SCOP, 1.5 MG,) 1 MG/3DAYS Place 1 patch (1.5 mg total) onto the skin every 3 (three) days. Apply night before surgery. 10 patch 12   No current facility-administered medications for this visit.   Facility-Administered Medications Ordered in Other Visits  Medication Dose Route Frequency Provider Last Rate Last Dose  . sodium chloride 0.9 % injection 10 mL  10 mL Intracatheter PRN [name], MD   10 mL at 08/23/14 1430  . sodium chloride 0.9 % injection 10 mL  10 mL Intracatheter PRN [name], MD   10 mL at 09/06/14 1400  . sodium chloride 0.9 % injection 10 mL  10 mL Intracatheter PRN [name], MD   10 mL at 09/13/14 1430    Review of Systems Review of Systems  Constitutional: Negative.   Respiratory: Negative.   Cardiovascular: Negative.     Blood pressure 108/60, pulse 76, resp. rate 14, height 5' 4"  (1.626 m), weight 122 lb (55.339 kg).  Physical Exam Physical Exam  Constitutional: She is oriented to person, place, and time. She appears well-developed and well-nourished.  Neck: Neck supple.  Cardiovascular: Normal rate, regular rhythm and normal heart sounds.   Pulmonary/Chest: Effort normal and breath sounds normal. Right breast exhibits  no inverted nipple, no mass, no nipple discharge, no skin change and no tenderness. Left breast exhibits no inverted nipple, no mass, no nipple discharge, no skin change and no tenderness.  Lymphadenopathy:    She has no cervical adenopathy.    She has no axillary adenopathy.  Neurological: She is alert and oriented to person, place, and time.  Skin: Skin is warm and dry.    Data Reviewed Ultrasound examination of the right breast was completed to determine if the prior tumor remains visible. There is indeed a 0.6 x 0.7 x 0.82 hypoechoic mass at the 9:00 position of the right breast, 6 cm from the nipple the corresponds to the original imaging studies in December 2015. Also noted is an  enlarged lymph gland in the right mid axilla measuring up to 1.44 cm in diameter. Echo architecture is near normal.  Assessment    Doing well completing neo-adjuvant chemotherapy for a T1b, N1 stage II carcinoma the right breast. Desires breast conservation.    Plan    The we reviewed the options for surgical management. The patient was accompanied today by her husband. The pros and cons of breast conservation versus mastectomy were reviewed. There are presented as equivalent. I did discuss with her that if more extensive disease was identified whether a mastectomy would be acceptable. She agreed to allow me to use my best judgment in this regard.  If the axillary nodes remain positive a formal axillary dissection will be undertaken.  Tentatively plan for surgery in early July 3 weeks after her last chemotherapy treatment.     Discussed risk and benefits of breast surgery. Patient is scheduled for surgery at [facility] on 10/28/14. She will pre admit by phone on 10/21/14. The day of surgery 10/28/14, she will report to the radiology desk at the hospital at 7:45 am. Patient is aware of dates, time, and instructions.   The patient reported profound nausea and vomiting after PowerPort placement. This may been related to hydrocodone given to the patient just prior to discharge. She was given a prescription today for Ultram 50 mg, #40 with the inscription to use one or 2 tablets by mouth every 4 hours when necessary for pain post wide excision. She was also sent a prescription for Transderm-Scop patch is to be applied the night prior to surgery.  PCP:  Ardeth Perfect Ref Dr Coralee Pesa 09/30/2014, 1:31 PM

## 2014-09-28 NOTE — Patient Instructions (Addendum)
The patient is aware to call back for any questions or concerns.  Patient is scheduled for surgery at [facility] on 10/28/14. She will pre admit by phone on 10/21/14. The day of surgery 10/28/14, she will report to the radiology desk at the hospital at 7:45 am. Patient is aware of dates, time, and instructions.

## 2014-09-30 ENCOUNTER — Encounter: Payer: Self-pay | Admitting: General Surgery

## 2014-09-30 DIAGNOSIS — R110 Nausea: Secondary | ICD-10-CM | POA: Insufficient documentation

## 2014-09-30 DIAGNOSIS — Z9889 Other specified postprocedural states: Secondary | ICD-10-CM

## 2014-09-30 NOTE — H&P (Signed)
Patient ID: Kiara Grant, female DOB: 08/22/1970, 44 y.o. MRN: [id number]  Chief Complaint   Patient presents with   .  Pre-op Exam    HPI  Kiara Grant is a 44 y.o. female. Here today to discuss having breast surgery, as she is soon to complete her neo-adjuvant treatment. She has one more chemotherapy treatment planned for next week on the 14th.  HPI  Past Medical History   Diagnosis  Date   .  Cancer  03-24-14     Right breast, microcalcifications. ER negative, PR less than 10%, HER-2/neu not amplified.   .  Melanoma  Sept 2011     Left neck T1a, 0.35 mm. Treated by Mohs injury [facility].   .  Breast cancer  03/2014     Right, T1b, N1 prior to neo-adjuvant chemotherapy. ER negative, PR less than 10%, HER-2/neu not overexpressed.    Past Surgical History   Procedure  Laterality  Date   .  Breast biopsy  Right  03-24-14   .  Melanoma excision   Nov 2011     neck   .  Portacath placement   Dec 2015    Family History   Problem  Relation  Age of Onset   .  Cancer  Mother      lung ? mid 61    Social History  History   Substance Use Topics   .  Smoking status:  Never Smoker   .  Smokeless tobacco:  Never Used   .  Alcohol Use:  No      Comment: 3/week    Allergies   Allergen  Reactions   .  Sulfa Antibiotics  Nausea Only    Current Outpatient Prescriptions   Medication  Sig  Dispense  Refill   .  lidocaine-prilocaine (EMLA) cream    3   .  loratadine-pseudoephedrine (CLARITIN-D 24-HOUR) 10-240 MG per 24 hr tablet  Take 1 tablet by mouth as needed.     Marland Kitchen  omeprazole (PRILOSEC) 20 MG capsule  Take 1 capsule (20 mg total) by mouth 2 (two) times daily before a meal.  60 capsule  3   .  ondansetron (ZOFRAN) 4 MG tablet  Take 4 mg by mouth every 6 (six) hours as needed for nausea or vomiting.     .  promethazine (PHENERGAN) 25 MG tablet  Take 1 tablet (25 mg total) by mouth every 6 (six) hours as needed for nausea or vomiting.  12 tablet  0   .  senna (SENOKOT) 8.6  MG TABS tablet  Take 1 tablet by mouth daily as needed for mild constipation.     Marland Kitchen  scopolamine (TRANSDERM-SCOP, 1.5 MG,) 1 MG/3DAYS  Place 1 patch (1.5 mg total) onto the skin every 3 (three) days. Apply night before surgery.  10 patch  12    No current facility-administered medications for this visit.    Facility-Administered Medications Ordered in Other Visits   Medication  Dose  Route  Frequency  Provider  Last Rate  Last Dose   .  sodium chloride 0.9 % injection 10 mL  10 mL  Intracatheter  PRN  [name], MD   10 mL at 08/23/14 1430   .  sodium chloride 0.9 % injection 10 mL  10 mL  Intracatheter  PRN  [name], MD   10 mL at 09/06/14 1400   .  sodium chloride 0.9 % injection 10 mL  10 mL  Intracatheter  PRN  [name], MD   10 mL at 09/13/14 1430    Review of Systems  Review of Systems  Constitutional: Negative.  Respiratory: Negative.  Cardiovascular: Negative.   Blood pressure 108/60, pulse 76, resp. rate 14, height 5' 4"  (1.626 m), weight 122 lb (55.339 kg).  Physical Exam  Physical Exam  Constitutional: She is oriented to person, place, and time. She appears well-developed and well-nourished.  Neck: Neck supple.  Cardiovascular: Normal rate, regular rhythm and normal heart sounds.  Pulmonary/Chest: Effort normal and breath sounds normal. Right breast exhibits no inverted nipple, no mass, no nipple discharge, no skin change and no tenderness. Left breast exhibits no inverted nipple, no mass, no nipple discharge, no skin change and no tenderness.  Lymphadenopathy:  She has no cervical adenopathy.  She has no axillary adenopathy.  Neurological: She is alert and oriented to person, place, and time.  Skin: Skin is warm and dry.   Data Reviewed  Ultrasound examination of the right breast was completed to determine if the prior tumor remains visible. There is indeed a 0.6 x 0.7 x 0.82 hypoechoic mass at the 9:00 position of the right breast, 6 cm from the nipple the  corresponds to the original imaging studies in December 2015. Also noted is an enlarged lymph gland in the right mid axilla measuring up to 1.44 cm in diameter. Echo architecture is near normal.  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 Desires breast conservation.   Plan   The we reviewed the options for surgical management. The patient was accompanied today by her husband. The pros and cons of breast conservation versus mastectomy were reviewed. There are presented as equivalent. I did discuss with her that if more extensive disease was identified whether a mastectomy would be acceptable. She agreed to allow me to use my best judgment in this regard.  If the axillary nodes remain positive a formal axillary dissection will be undertaken.  Tentatively plan for surgery in early July 3 weeks after her last chemotherapy treatment.   Discussed risk and benefits of breast surgery.  Patient is scheduled for surgery at [facility] on 10/28/14. She will pre admit by phone on 10/21/14. The day of surgery 10/28/14, she will report to the radiology desk at the hospital at 7:45 am. Patient is aware of dates, time, and instructions.  PCP: Ardeth Perfect  Ref Dr Coralee Pesa  09/30/2014, 1:31 PM

## 2014-10-03 ENCOUNTER — Other Ambulatory Visit: Payer: BLUE CROSS/BLUE SHIELD

## 2014-10-03 ENCOUNTER — Ambulatory Visit: Payer: BLUE CROSS/BLUE SHIELD

## 2014-10-04 ENCOUNTER — Inpatient Hospital Stay: Payer: BLUE CROSS/BLUE SHIELD

## 2014-10-04 ENCOUNTER — Ambulatory Visit: Payer: BLUE CROSS/BLUE SHIELD

## 2014-10-04 VITALS — BP 110/69 | HR 78 | Temp 97.7°F | Resp 18

## 2014-10-04 DIAGNOSIS — C50919 Malignant neoplasm of unspecified site of unspecified female breast: Secondary | ICD-10-CM

## 2014-10-04 DIAGNOSIS — C50911 Malignant neoplasm of unspecified site of right female breast: Secondary | ICD-10-CM

## 2014-10-04 LAB — CBC WITH DIFFERENTIAL/PLATELET
BASOS ABS: 0 10*3/uL (ref 0–0.1)
BASOS PCT: 1 %
EOS ABS: 0.1 10*3/uL (ref 0–0.7)
Eosinophils Relative: 2 %
HEMATOCRIT: 32.9 % — AB (ref 35.0–47.0)
Hemoglobin: 11.3 g/dL — ABNORMAL LOW (ref 12.0–16.0)
Lymphocytes Relative: 29 %
Lymphs Abs: 0.8 10*3/uL — ABNORMAL LOW (ref 1.0–3.6)
MCH: 33.7 pg (ref 26.0–34.0)
MCHC: 34.3 g/dL (ref 32.0–36.0)
MCV: 98 fL (ref 80.0–100.0)
MONO ABS: 0.2 10*3/uL (ref 0.2–0.9)
MONOS PCT: 8 %
Neutro Abs: 1.7 10*3/uL (ref 1.4–6.5)
Neutrophils Relative %: 60 %
Platelets: 223 10*3/uL (ref 150–440)
RBC: 3.35 MIL/uL — AB (ref 3.80–5.20)
RDW: 14.1 % (ref 11.5–14.5)
WBC: 2.9 10*3/uL — ABNORMAL LOW (ref 3.6–11.0)

## 2014-10-04 LAB — COMPREHENSIVE METABOLIC PANEL
ALBUMIN: 4.1 g/dL (ref 3.5–5.0)
ALT: 24 U/L (ref 14–54)
ANION GAP: 6 (ref 5–15)
AST: 24 U/L (ref 15–41)
Alkaline Phosphatase: 40 U/L (ref 38–126)
BILIRUBIN TOTAL: 0.5 mg/dL (ref 0.3–1.2)
BUN: 10 mg/dL (ref 6–20)
CHLORIDE: 104 mmol/L (ref 101–111)
CO2: 26 mmol/L (ref 22–32)
Calcium: 8.8 mg/dL — ABNORMAL LOW (ref 8.9–10.3)
Creatinine, Ser: 0.76 mg/dL (ref 0.44–1.00)
GFR calc Af Amer: >60 mL/min (ref >60)
GFR calc non Af Amer: >60 mL/min (ref >60)
Glucose, Bld: 133 mg/dL — ABNORMAL HIGH (ref 65–99)
POTASSIUM: 3.8 mmol/L (ref 3.5–5.1)
Sodium: 136 mmol/L (ref 135–145)
TOTAL PROTEIN: 6.8 g/dL (ref 6.5–8.1)

## 2014-10-04 MED ORDER — DIPHENHYDRAMINE HCL 50 MG/ML IJ SOLN
25.0000 mg | Freq: Once | INTRAMUSCULAR | Status: AC
Start: 2014-10-04 — End: 2014-10-04
  Administered 2014-10-04: 25 mg via INTRAVENOUS
  Filled 2014-10-04: qty 1

## 2014-10-04 MED ORDER — PACLITAXEL CHEMO INJECTION 300 MG/50ML
80.0000 mg/m2 | Freq: Once | INTRAVENOUS | Status: AC
  Administered 2014-10-04: 126 mg via INTRAVENOUS
  Filled 2014-10-04: qty 21

## 2014-10-04 MED ORDER — SODIUM CHLORIDE 0.9 % IV SOLN
Freq: Once | INTRAVENOUS | Status: AC
  Administered 2014-10-04: 14:00:00 via INTRAVENOUS
  Filled 2014-10-04: qty 1000

## 2014-10-04 MED ORDER — ACETAMINOPHEN 325 MG PO TABS
650.0000 mg | ORAL_TABLET | Freq: Once | ORAL | Status: AC
  Administered 2014-10-04: 650 mg via ORAL
  Filled 2014-10-04: qty 2

## 2014-10-04 MED ORDER — HEPARIN SOD (PORK) LOCK FLUSH 100 UNIT/ML IV SOLN
500.0000 [IU] | Freq: Once | INTRAVENOUS | Status: AC | PRN
  Administered 2014-10-04: 500 [IU]
  Filled 2014-10-04: qty 5

## 2014-10-04 MED ORDER — SODIUM CHLORIDE 0.9 % IV SOLN
Freq: Once | INTRAVENOUS | Status: AC
  Administered 2014-10-04: 15:00:00 via INTRAVENOUS
  Filled 2014-10-04: qty 4

## 2014-10-04 MED ORDER — FAMOTIDINE IN NACL 20-0.9 MG/50ML-% IV SOLN
20.0000 mg | Freq: Once | INTRAVENOUS | Status: AC
  Administered 2014-10-04: 20 mg via INTRAVENOUS
  Filled 2014-10-04: qty 50

## 2014-10-04 MED ORDER — SODIUM CHLORIDE 0.9 % IJ SOLN
10.0000 mL | INTRAMUSCULAR | Status: DC | PRN
  Administered 2014-10-04: 10 mL
  Filled 2014-10-04: qty 10

## 2014-10-06 ENCOUNTER — Ambulatory Visit: Payer: Self-pay | Admitting: General Surgery

## 2014-10-21 ENCOUNTER — Inpatient Hospital Stay: Admission: RE | Admit: 2014-10-21 | Payer: BLUE CROSS/BLUE SHIELD | Source: Ambulatory Visit

## 2014-10-21 ENCOUNTER — Encounter: Payer: Self-pay | Admitting: *Deleted

## 2014-10-21 NOTE — Patient Instructions (Signed)
  Your procedure is scheduled on: 10-28-14 Report to [facility] @ 7:45   Remember: Instructions that are not followed completely may result in serious medical risk, up to and including death, or upon the discretion of your surgeon and anesthesiologist your surgery may need to be rescheduled.    _X___ 1. Do not eat food or drink liquids after midnight. No gum chewing or hard candies.     _X___ 2. No Alcohol for 24 hours before or after surgery.   ____ 3. Bring all medications with you on the day of surgery if instructed.    ____ 4. Notify your doctor if there is any change in your medical condition     (cold, fever, infections).     Do not wear jewelry, make-up, hairpins, clips or nail polish.  Do not wear lotions, powders, or perfumes. You may wear deodorant.  Do not shave 48 hours prior to surgery. Men may shave face and neck.  Do not bring valuables to the hospital.    [facility] is not responsible for any belongings or valuables.               Contacts, dentures or bridgework may not be worn into surgery.  Leave your suitcase in the car. After surgery it may be brought to your room.  For patients admitted to the hospital, discharge time is determined by your treatment team.   Patients discharged the day of surgery will not be allowed to drive home.   Please read over the following fact sheets that you were given:      _X___ Take these medicines the morning of surgery with A SIP OF WATER:    1. PRILOSEC  2. TAKE AN EXTRA PRILOSEC Thursday NIGHT  3.   4.  5.  6.  ____ Fleet Enema (as directed)   ____ Use CHG Soap as directed  ____ Use inhalers on the day of surgery  ____ Stop metformin 2 days prior to surgery    ____ Take 1/2 of usual insulin dose the night before surgery and none on the morning of surgery.   ____ Stop Coumadin/Plavix/aspirin-N/A  ____ Stop Anti-inflammatories-NO NSAIDS OR ASA PRODUCTS-TYLENOL OK   ____ Stop supplements until  after surgery.    ____ Bring C-Pap to the hospital.

## 2014-10-27 ENCOUNTER — Other Ambulatory Visit
Admission: RE | Admit: 2014-10-27 | Discharge: 2014-10-27 | Disposition: A | Discharge status: Home / Self Care | Payer: BLUE CROSS/BLUE SHIELD | Source: Ambulatory Visit | Attending: General Surgery | Admitting: General Surgery

## 2014-10-27 ENCOUNTER — Other Ambulatory Visit: Payer: Self-pay

## 2014-10-27 DIAGNOSIS — N63 Unspecified lump in breast: Secondary | ICD-10-CM | POA: Diagnosis present

## 2014-10-27 DIAGNOSIS — IMO0002 Reserved for concepts with insufficient information to code with codable children: Secondary | ICD-10-CM

## 2014-10-27 LAB — HCG, QUANTITATIVE, PREGNANCY: HCG, BETA CHAIN, QUANT, S: 1 m[IU]/mL (ref <5)

## 2014-10-28 ENCOUNTER — Ambulatory Visit: Payer: BLUE CROSS/BLUE SHIELD

## 2014-10-28 ENCOUNTER — Encounter
Admission: RE | Disposition: A | Discharge status: Home / Self Care | Payer: Self-pay | Source: Ambulatory Visit | Attending: General Surgery

## 2014-10-28 ENCOUNTER — Ambulatory Visit
Admission: RE | Admit: 2014-10-28 | Discharge: 2014-10-28 | Disposition: A | Discharge status: Home / Self Care | Payer: BLUE CROSS/BLUE SHIELD | Source: Ambulatory Visit | Attending: General Surgery | Admitting: General Surgery

## 2014-10-28 ENCOUNTER — Ambulatory Visit: Payer: BLUE CROSS/BLUE SHIELD | Admitting: Anesthesiology

## 2014-10-28 ENCOUNTER — Encounter: Payer: Self-pay | Admitting: *Deleted

## 2014-10-28 DIAGNOSIS — R920 Mammographic microcalcification found on diagnostic imaging of breast: Secondary | ICD-10-CM | POA: Diagnosis not present

## 2014-10-28 DIAGNOSIS — C773 Secondary and unspecified malignant neoplasm of axilla and upper limb lymph nodes: Secondary | ICD-10-CM | POA: Insufficient documentation

## 2014-10-28 DIAGNOSIS — D0511 Intraductal carcinoma in situ of right breast: Secondary | ICD-10-CM | POA: Insufficient documentation

## 2014-10-28 DIAGNOSIS — Z9889 Other specified postprocedural states: Secondary | ICD-10-CM | POA: Diagnosis not present

## 2014-10-28 DIAGNOSIS — Z79899 Other long term (current) drug therapy: Secondary | ICD-10-CM | POA: Diagnosis not present

## 2014-10-28 DIAGNOSIS — C50911 Malignant neoplasm of unspecified site of right female breast: Secondary | ICD-10-CM

## 2014-10-28 DIAGNOSIS — Z809 Family history of malignant neoplasm, unspecified: Secondary | ICD-10-CM | POA: Insufficient documentation

## 2014-10-28 DIAGNOSIS — Z85820 Personal history of malignant melanoma of skin: Secondary | ICD-10-CM | POA: Insufficient documentation

## 2014-10-28 DIAGNOSIS — Z882 Allergy status to sulfonamides status: Secondary | ICD-10-CM | POA: Insufficient documentation

## 2014-10-28 DIAGNOSIS — C50411 Malignant neoplasm of upper-outer quadrant of right female breast: Secondary | ICD-10-CM | POA: Diagnosis not present

## 2014-10-28 HISTORY — DX: Other specified postprocedural states: R11.2

## 2014-10-28 HISTORY — DX: Other specified postprocedural states: Z98.890

## 2014-10-28 HISTORY — PX: BREAST EXCISIONAL BIOPSY: SUR124

## 2014-10-28 HISTORY — PX: BREAST LUMPECTOMY WITH SENTINEL LYMPH NODE BIOPSY: SHX5597

## 2014-10-28 SURGERY — BREAST LUMPECTOMY WITH SENTINEL LYMPH NODE BX
Anesthesia: General | Laterality: Right | Wound class: Clean

## 2014-10-28 MED ORDER — CALCIUM CHLORIDE 10 % IV SOLN
INTRAVENOUS | Status: DC | PRN
  Administered 2014-10-28: 1 g via INTRAVENOUS

## 2014-10-28 MED ORDER — MIDAZOLAM HCL 2 MG/2ML IJ SOLN
INTRAMUSCULAR | Status: DC | PRN
  Administered 2014-10-28: 2 mg via INTRAVENOUS

## 2014-10-28 MED ORDER — ONDANSETRON HCL 4 MG/2ML IJ SOLN
INTRAMUSCULAR | Status: DC | PRN
  Administered 2014-10-28: 4 mg via INTRAVENOUS

## 2014-10-28 MED ORDER — BUPIVACAINE-EPINEPHRINE (PF) 0.5% -1:200000 IJ SOLN
INTRAMUSCULAR | Status: DC | PRN
  Administered 2014-10-28: 30 mL via PERINEURAL

## 2014-10-28 MED ORDER — KETOROLAC TROMETHAMINE 30 MG/ML IJ SOLN
INTRAMUSCULAR | Status: DC | PRN
  Administered 2014-10-28: 30 mg via INTRAVENOUS

## 2014-10-28 MED ORDER — LIDOCAINE HCL (CARDIAC) 20 MG/ML IV SOLN
INTRAVENOUS | Status: DC | PRN
  Administered 2014-10-28: 100 mg via INTRAVENOUS

## 2014-10-28 MED ORDER — ACETAMINOPHEN 10 MG/ML IV SOLN
INTRAVENOUS | Status: AC
  Filled 2014-10-28: qty 100

## 2014-10-28 MED ORDER — DEXAMETHASONE SODIUM PHOSPHATE 4 MG/ML IJ SOLN
INTRAMUSCULAR | Status: DC | PRN
  Administered 2014-10-28 (×2): 5 mg via INTRAVENOUS

## 2014-10-28 MED ORDER — DEXMEDETOMIDINE HCL IN NACL 200 MCG/50ML IV SOLN
INTRAVENOUS | Status: DC | PRN
  Administered 2014-10-28: 4 ug via INTRAVENOUS
  Administered 2014-10-28: 12 ug via INTRAVENOUS
  Administered 2014-10-28 (×2): 4 ug via INTRAVENOUS

## 2014-10-28 MED ORDER — METHYLENE BLUE 1 % INJ SOLN
INTRAMUSCULAR | Status: AC
  Filled 2014-10-28: qty 10

## 2014-10-28 MED ORDER — METHYLENE BLUE 1 % INJ SOLN
INTRAMUSCULAR | Status: DC | PRN
Start: 2014-10-28 — End: 2014-10-28
  Administered 2014-10-28: 1.5 mL via SUBMUCOSAL

## 2014-10-28 MED ORDER — TECHNETIUM TC 99M SULFUR COLLOID
1.0590 | Freq: Once | INTRAVENOUS | Status: AC | PRN
  Administered 2014-10-28: 1.059 via INTRAVENOUS

## 2014-10-28 MED ORDER — MAGNESIUM SULFATE 50 % IJ SOLN
INTRAMUSCULAR | Status: DC | PRN
  Administered 2014-10-28: 1 g via INTRAVENOUS

## 2014-10-28 MED ORDER — CALCIUM CHLORIDE 10 % IV SOLN
INTRAVENOUS | Status: AC
  Filled 2014-10-28: qty 10

## 2014-10-28 MED ORDER — TRAMADOL HCL 50 MG PO TABS: 50.0000 mg | ORAL_TABLET | ORAL | Status: DC | PRN

## 2014-10-28 MED ORDER — MAGNESIUM SULFATE 50 % IJ SOLN: 1.0000 g | Freq: Once | INTRAMUSCULAR | Status: DC

## 2014-10-28 MED ORDER — SODIUM CHLORIDE 0.9 % IJ SOLN
INTRAMUSCULAR | Status: AC
  Filled 2014-10-28: qty 10

## 2014-10-28 MED ORDER — ACETAMINOPHEN 10 MG/ML IV SOLN
INTRAVENOUS | Status: DC | PRN
  Administered 2014-10-28: 1000 mg via INTRAVENOUS

## 2014-10-28 MED ORDER — LACTATED RINGERS IV SOLN
INTRAVENOUS | Status: DC
  Administered 2014-10-28: 09:00:00 via INTRAVENOUS

## 2014-10-28 MED ORDER — PROPOFOL 10 MG/ML IV BOLUS
INTRAVENOUS | Status: DC | PRN
  Administered 2014-10-28: 30 mg via INTRAVENOUS
  Administered 2014-10-28: 200 mg via INTRAVENOUS

## 2014-10-28 MED ORDER — PHENYLEPHRINE HCL 10 MG/ML IJ SOLN
10000.0000 ug | INTRAMUSCULAR | Status: DC | PRN
  Administered 2014-10-28 (×5): 100 ug via INTRAVENOUS

## 2014-10-28 MED ORDER — BUPIVACAINE-EPINEPHRINE (PF) 0.5% -1:200000 IJ SOLN
INTRAMUSCULAR | Status: AC
  Filled 2014-10-28: qty 30

## 2014-10-28 SURGICAL SUPPLY — 51 items
BANDAGE ELASTIC 6 CLIP NS LF (GAUZE/BANDAGES/DRESSINGS) ×3 IMPLANT
BENZOIN TINCTURE PRP APPL 2/3 (GAUZE/BANDAGES/DRESSINGS) ×3 IMPLANT
BLADE SURG 15 STRL SS SAFETY (BLADE) ×6 IMPLANT
BNDG GAUZE 4.5X4.1 6PLY STRL (MISCELLANEOUS) ×3 IMPLANT
BULB RESERV EVAC DRAIN JP 100C (MISCELLANEOUS) ×3 IMPLANT
CANISTER SUCT 1200ML W/VALVE (MISCELLANEOUS) ×3 IMPLANT
CHLORAPREP W/TINT 26ML (MISCELLANEOUS) ×3 IMPLANT
CLOSURE WOUND 1/2 X4 (GAUZE/BANDAGES/DRESSINGS) ×1
CNTNR SPEC 2.5X3XGRAD LEK (MISCELLANEOUS) ×1
CONT SPEC 4OZ STER OR WHT (MISCELLANEOUS) ×2
CONTAINER SPEC 2.5X3XGRAD LEK (MISCELLANEOUS) ×1 IMPLANT
COVER PROBE FLX POLY STRL (MISCELLANEOUS) ×3 IMPLANT
DEVICE DUBIN SPECIMEN MAMMOGRA (MISCELLANEOUS) ×3 IMPLANT
DRAIN CHANNEL JP 15F RND 16 (MISCELLANEOUS) ×3 IMPLANT
DRAPE LAPAROTOMY TRNSV 106X77 (MISCELLANEOUS) ×3 IMPLANT
DRESSING TELFA 4X3 1S ST N-ADH (GAUZE/BANDAGES/DRESSINGS) ×3 IMPLANT
DRSG TEGADERM 4X4.75 (GAUZE/BANDAGES/DRESSINGS) ×3 IMPLANT
DRSG TELFA 3X8 NADH (GAUZE/BANDAGES/DRESSINGS) ×3 IMPLANT
ELECT CAUTERY BLADE TIP 2.5 (TIP) ×3
ELECTRODE CAUTERY BLDE TIP 2.5 (TIP) ×1 IMPLANT
GAUZE FLUFF 18X24 1PLY STRL (GAUZE/BANDAGES/DRESSINGS) ×3 IMPLANT
GAUZE SPONGE 4X4 12PLY STRL (GAUZE/BANDAGES/DRESSINGS) ×3 IMPLANT
GLOVE BIO SURGEON STRL SZ7.5 (GLOVE) ×3 IMPLANT
GLOVE INDICATOR 8.0 STRL GRN (GLOVE) ×3 IMPLANT
GOWN STRL REUS W/ TWL LRG LVL3 (GOWN DISPOSABLE) ×2 IMPLANT
GOWN STRL REUS W/TWL LRG LVL3 (GOWN DISPOSABLE) ×4
HARMONIC SCALPEL FOCUS (MISCELLANEOUS) ×3 IMPLANT
KIT RM TURNOVER STRD PROC AR (KITS) ×3 IMPLANT
LABEL OR SOLS (LABEL) ×3 IMPLANT
MARGIN MAP 10MM (MISCELLANEOUS) ×3 IMPLANT
NDL SAFETY 22GX1.5 (NEEDLE) ×3 IMPLANT
NDL SAFETY 25GX1.5 (NEEDLE) ×6 IMPLANT
PACK BASIN MINOR ARMC (MISCELLANEOUS) ×3 IMPLANT
PAD GROUND ADULT SPLIT (MISCELLANEOUS) ×3 IMPLANT
SHEARS FOC LG CVD HARMONIC 17C (MISCELLANEOUS) IMPLANT
SLEVE PROBE SENORX GAMMA FIND (MISCELLANEOUS) ×3 IMPLANT
STRIP CLOSURE SKIN 1/2X4 (GAUZE/BANDAGES/DRESSINGS) ×2 IMPLANT
SUT ETHILON 3-0 FS-10 30 BLK (SUTURE) ×6
SUT SILK 2 0 (SUTURE) ×2
SUT SILK 2-0 18XBRD TIE 12 (SUTURE) ×1 IMPLANT
SUT VIC AB 2-0 CT1 (SUTURE) ×9 IMPLANT
SUT VIC AB 2-0 CT1 27 (SUTURE)
SUT VIC AB 2-0 CT1 TAPERPNT 27 (SUTURE) IMPLANT
SUT VICRYL+ 3-0 144IN (SUTURE) ×3 IMPLANT
SUTURE EHLN 3-0 FS-10 30 BLK (SUTURE) ×2 IMPLANT
SWABSTK COMLB BENZOIN TINCTURE (MISCELLANEOUS) ×3 IMPLANT
SYR BULB IRRIG 60ML STRL (SYRINGE) ×3 IMPLANT
SYR CONTROL 10ML (SYRINGE) ×3 IMPLANT
SYRINGE 10CC LL (SYRINGE) ×3 IMPLANT
TAPE TRANSPORE STRL 2 31045 (GAUZE/BANDAGES/DRESSINGS) ×3 IMPLANT
WATER STERILE IRR 1000ML POUR (IV SOLUTION) ×3 IMPLANT

## 2014-10-28 NOTE — Transfer of Care (Signed)
Immediate Anesthesia Transfer of Care Note  Patient: Kiara Grant  Procedure(s) Performed: Procedure(s): right breast wide excision with sentinel node biopsy, mastoplasty, axillary dissection  (Right)  Patient Location: PACU  Anesthesia Type:General  Level of Consciousness: awake, alert  and oriented  Airway & Oxygen Therapy: Patient Spontanous Breathing and Patient connected to face mask oxygen  Post-op Assessment: Report given to RN and Post -op Vital signs reviewed and stable  Post vital signs: Reviewed and stable  Last Vitals:  Filed Vitals:   10/28/14 0851  BP: 100/62  Pulse: 83  Temp: 37.2 C  Resp: 16    Complications: No apparent anesthesia complications

## 2014-10-28 NOTE — Anesthesia Procedure Notes (Signed)
Procedure Name: LMA Insertion Date/Time: 10/28/2014 10:00 AM Performed by: Aline Brochure Pre-anesthesia Checklist: Patient identified, Emergency Drugs available, Suction available and Patient being monitored Patient Re-evaluated:Patient Re-evaluated prior to inductionOxygen Delivery Method: Circle system utilized Preoxygenation: Pre-oxygenation with 100% oxygen Intubation Type: IV induction Ventilation: Mask ventilation without difficulty LMA: LMA inserted LMA Size: 3.5 Number of attempts: 1 Airway Equipment and Method: Patient positioned with wedge pillow Placement Confirmation: breath sounds checked- equal and bilateral and positive ETCO2 Tube secured with: Tape Dental Injury: Teeth and Oropharynx as per pre-operative assessment

## 2014-10-28 NOTE — Anesthesia Preprocedure Evaluation (Signed)
Anesthesia Evaluation  Patient identified by MRN, date of birth, ID band Patient awake    Reviewed: Allergy & Precautions, NPO status , Patient's Chart, lab work & pertinent test results  History of Anesthesia Complications (+) PONV  Airway Mallampati: II  TM Distance: >3 FB Neck ROM: Full    Dental  (+) Teeth Intact   Pulmonary    Pulmonary exam normal       Cardiovascular Exercise Tolerance: Good Normal cardiovascular exam    Neuro/Psych    GI/Hepatic GERD-  Medicated and Controlled,  Endo/Other    Renal/GU      Musculoskeletal   Abdominal Normal abdominal exam  (+)   Peds  Hematology   Anesthesia Other Findings   Reproductive/Obstetrics                             Anesthesia Physical Anesthesia Plan  ASA: III  Anesthesia Plan: General   Post-op Pain Management:    Induction: Intravenous  Airway Management Planned: LMA  Additional Equipment:   Intra-op Plan:   Post-operative Plan: Extubation in OR  Informed Consent: I have reviewed the patients History and Physical, chart, labs and discussed the procedure including the risks, benefits and alternatives for the proposed anesthesia with the patient or authorized representative who has indicated his/her understanding and acceptance.     Plan Discussed with: CRNA  Anesthesia Plan Comments: (Scopolamine patch, avoid narcotics, multiple ant- nausea drugs, low dose inhalational agents.)        Anesthesia Quick Evaluation

## 2014-10-28 NOTE — H&P (Signed)
44 year old woman with right breast cancer status post new adjuvant chemotherapy. For planned wide excision, sentinel node biopsy with axillary dissection if indicated.  No change in general health since her last visit.

## 2014-10-28 NOTE — Discharge Instructions (Signed)
AMBULATORY SURGERY  DISCHARGE INSTRUCTIONS   1) The drugs that you were given will stay in your system until tomorrow so for the next 24 hours you should not:  A) Drive an automobile B) Make any legal decisions C) Drink any alcoholic beverage   2) You may resume regular meals tomorrow.  Today it is better to start with liquids and gradually work up to solid foods.  You may eat anything you prefer, but it is better to start with liquids, then soup and crackers, and gradually work up to solid foods.   3) Please notify your doctor immediately if you have any unusual bleeding, trouble breathing, redness and pain at the surgery site, drainage, fever, or pain not relieved by medication.  4) Your post-operative visit with Dr.                                     is: Date:                        Time:

## 2014-10-28 NOTE — Op Note (Signed)
Preoperative diagnosis: Right breast cancer status post neo-adjuvant chemotherapy.  Postoperative diagnosis: Same.  Operative procedure right breast wide excision with mastoplasty, sentinel node biopsy, axillary dissection.  Operative surgeon Ollen Bowl, M.D.  Anesthesia Gen. by LMA.  0.5% Marcaine with 1-200,000 of epinephrine, 30 mL local infiltration.  Estimated blood loss less than 20 mL.  Clinical note this 44 year old woman had a node positive cancer time of diagnosis and is undergone [facility] chemotherapy. She is brought there room for planned wide excision and sentinel node biopsy.  Operative note: The patient under adequate general anesthesia the breast chest neck so was prep with chlor prep after injection of 3 mL mix of normal saline to 1 with methylene blue. She had previously undergone injection with technetium sulfur colloid. After draping scanning to the axilla with the gamma finder showed no area of significant increased uptake.  Ultrasound was brought into the field and the previously noted enlarged lymph node in the left axilla was marked as was the primary tumor site and 9:00 position of the right breast. Marcaine was infiltrated in the skin for postoperative analgesia. A small transverse skin line incision was made and the enlarged lymph node removed. This was roasted positive for metastatic disease. While the node was being processed attention was turned breast. A radial incision was made from 9:00 position centered over the residual tumor mass. The skin was incised sharply as was the adipose tissue. The remaining dissection was completed with electrocautery. A 4 x 6 x 4 7 m block of tissue including the pectoralis fascia was removed, oriented and sent for specimen radiograph. This showed the clip in the lateral aspect of the biopsy site. The gross examination report by Dr. Luana Shu from pathology showed the closest margin to be pectoralis fascia.  A right axillary  dissection was completed with the superior margin of dissection being the axillary vein. The long thoracic nerve of Bell as well as the thoracodorsal nerve artery and vein bundle were identified and protected. The contents were sent in formalin for routine histology. A 15-gauge Blake drain was brought through separate stab wound incision and neck in place with 3-0 nylon. The axillary wound was closed in layers with 2-0 Vicryls to the axillary M blow up in the running 4-0 Vicryls septic suture for the skin.  Attention was returned to the breasts were mastoplasty was completed to fill the generous dead space created during tumor resection. The breast was elevated off the underlying pectoralis muscle taking the fascia with the breast parenchyma for approximately 5 cm circumferentially. This was then approximated with interrupted 2-0 Vicryls figure-of-eight sutures. The skin was elevated as flaps 3 cm inferiorly 5 cm superiorly. The underlying breast parenchyma was again approximated more superficially with interrupted 2-0 Vicryls figure-of-eight sutures. The skin was eventually proximally with a running 2-0 Vicryls septic suture. R cane was infiltrated at the wide excision site as well as the axilla.  Benzoin, Steri-Strips, Telfa and Tegaderm dressings were applied. The Blake drain was placed to self suction. Fluff gauze, Kerlix and Ace wrap was then applied.  The patient tolerated the procedure well was taken to the recovery room stable condition.

## 2014-10-28 NOTE — Anesthesia Postprocedure Evaluation (Signed)
  Anesthesia Post-op Note  Patient: Kiara Grant  Procedure(s) Performed: Procedure(s): right breast wide excision with sentinel node biopsy, mastoplasty, axillary dissection  (Right)  Anesthesia type:General  Patient location: PACU  Post pain: Pain level controlled  Post assessment: Post-op Vital signs reviewed, Patient's Cardiovascular Status Stable, Respiratory Function Stable, Patent Airway and No signs of Nausea or vomiting  Post vital signs: Reviewed and stable  Last Vitals:  Filed Vitals:   10/28/14 0851  BP: 100/62  Pulse: 83  Temp: 37.2 C  Resp: 16    Level of consciousness: awake, alert  and patient cooperative  Complications: No apparent anesthesia complications

## 2014-10-31 ENCOUNTER — Ambulatory Visit: Payer: BLUE CROSS/BLUE SHIELD | Admitting: General Surgery

## 2014-10-31 ENCOUNTER — Encounter: Payer: Self-pay | Admitting: General Surgery

## 2014-10-31 ENCOUNTER — Ambulatory Visit (INDEPENDENT_AMBULATORY_CARE_PROVIDER_SITE_OTHER): Payer: BLUE CROSS/BLUE SHIELD | Admitting: General Surgery

## 2014-10-31 VITALS — BP 110/62 | HR 72 | Resp 12 | Ht 64.0 in | Wt 125.0 lb

## 2014-10-31 DIAGNOSIS — C50911 Malignant neoplasm of unspecified site of right female breast: Secondary | ICD-10-CM

## 2014-10-31 NOTE — Progress Notes (Signed)
Patient ID: Kiara Grant, female   DOB: Jan 11, 1971, 44 y.o.   MRN: [id number]  Chief Complaint  Patient presents with  . Routine Post Op    right breast wide excision     HPI Kiara Grant is a 44 y.o. female here today for her post op right breast wide excison done on 10/28/14. Patient states she is doing well. The patient has not required any narcotics for pain. Good relief with OTC Advil. HPI  Past Medical History  Diagnosis Date  . Cancer 03-24-14    Right breast, microcalcifications. ER negative, PR less than 10%, HER-2/neu not amplified.  . Melanoma Sept 2011    Left neck T1a, 0.35 mm. Treated by Mohs injury [facility].  . Breast cancer 03/2014    Right, T1b, N1 prior to neo-adjuvant chemotherapy. ER negative, PR less than 10%, HER-2/neu not overexpressed.  Marland Kitchen GERD (gastroesophageal reflux disease)   . PONV (postoperative nausea and vomiting)     Past Surgical History  Procedure Laterality Date  . Breast biopsy Right 03-24-14  . Melanoma excision  Nov 2011    neck  . Portacath placement  Dec 2015  . Breast lumpectomy with sentinel lymph node biopsy Right 10/28/2014    Procedure: right breast wide excision with sentinel node biopsy, mastoplasty, axillary dissection ;  Surgeon: Robert Bellow, MD;  Location: [facility] ORS;  Service: General;  Laterality: Right;    Family History  Problem Relation Age of Onset  . Cancer Mother     lung ? mid 61    Social History History  Substance Use Topics  . Smoking status: Never Smoker   . Smokeless tobacco: Never Used  . Alcohol Use: 0.0 oz/week    0 Standard drinks or equivalent per week     Comment: 3/week    Allergies  Allergen Reactions  . Sulfa Antibiotics Nausea Only    Current Outpatient Prescriptions  Medication Sig Dispense Refill  . loratadine-pseudoephedrine (CLARITIN-D 24-HOUR) 10-240 MG per 24 hr tablet Take 1 tablet by mouth as needed.     Marland Kitchen omeprazole (PRILOSEC) 20 MG capsule Take 1 capsule (20 mg total)  by mouth 2 (two) times daily before a meal. 60 capsule 3  . ondansetron (ZOFRAN) 4 MG tablet Take 4 mg by mouth every 6 (six) hours as needed for nausea or vomiting.    . promethazine (PHENERGAN) 25 MG tablet Take 1 tablet (25 mg total) by mouth every 6 (six) hours as needed for nausea or vomiting. 12 tablet 0  . senna (SENOKOT) 8.6 MG TABS tablet Take 1 tablet by mouth daily as needed for mild constipation.     No current facility-administered medications for this visit.   Facility-Administered Medications Ordered in Other Visits  Medication Dose Route Frequency Provider Last Rate Last Dose  . sodium chloride 0.9 % injection 10 mL  10 mL Intracatheter PRN [name], MD   10 mL at 08/23/14 1430  . sodium chloride 0.9 % injection 10 mL  10 mL Intracatheter PRN [name], MD   10 mL at 09/06/14 1400  . sodium chloride 0.9 % injection 10 mL  10 mL Intracatheter PRN [name], MD   10 mL at 09/13/14 1430    Review of Systems Review of Systems  Constitutional: Negative.   Respiratory: Negative.   Cardiovascular: Negative.     Blood pressure 110/62, pulse 72, resp. rate 12, height 5' 4"  (1.626 m), weight 125 lb (56.7 kg).  Physical Exam Physical  Exam  Constitutional: She is oriented to person, place, and time. She appears well-developed and well-nourished.  Eyes: Conjunctivae are normal. No scleral icterus.  Neck: Neck supple.  Cardiovascular: Normal rate, regular rhythm and normal heart sounds.   Pulmonary/Chest: Effort normal and breath sounds normal.    Right breast incision looks clean   Neurological: She is alert and oriented to person, place, and time.  Skin: Skin is warm and dry.    Data Reviewed Telephone report from Delorse Lek, M.D.: Margins negative. 3/12 nodes positive for macro metastatic disease.  Assessment    Doing well status post wide excision and axillary dissection post neo-adjuvant chemotherapy. pT1b,N1a posttreatment.    Plan    The drainage  volumes are such that she'll benefit from continued use of the axillary drain. Or proof dressing applied by the staff. She may increase her activity as tolerated but avoid repetitive activities with the right shoulder.     Patient to return in one week.   The patient will be a candidate for whole breast and axillary radiation after healing is complete.  Considering her low ER values, unlikely benefit from antiestrogen therapy. PCP: Ardeth Perfect Ref Dr Coralee Pesa 10/31/2014, 2:34 PM

## 2014-10-31 NOTE — Patient Instructions (Signed)
Patient to return in one week. 

## 2014-11-06 ENCOUNTER — Other Ambulatory Visit: Payer: Self-pay | Admitting: General Surgery

## 2014-11-06 DIAGNOSIS — C50911 Malignant neoplasm of unspecified site of right female breast: Secondary | ICD-10-CM

## 2014-11-06 MED ORDER — DOXYCYCLINE HYCLATE 100 MG PO TABS: 100.0000 mg | ORAL_TABLET | Freq: Two times a day (BID) | ORAL | Status: DC

## 2014-11-06 NOTE — Progress Notes (Signed)
Pt called stating she had a temp of 101 overnight. Also with increased pain in axilla. Drainage is decreased and still clear. She is not sure if there is any redness on the skin over the axillary incision. Will start on oral doxycycline empirically.

## 2014-11-08 ENCOUNTER — Inpatient Hospital Stay: Payer: BLUE CROSS/BLUE SHIELD | Attending: Oncology | Admitting: Oncology

## 2014-11-08 ENCOUNTER — Encounter: Payer: Self-pay | Admitting: General Surgery

## 2014-11-08 ENCOUNTER — Ambulatory Visit (INDEPENDENT_AMBULATORY_CARE_PROVIDER_SITE_OTHER): Payer: BLUE CROSS/BLUE SHIELD | Admitting: General Surgery

## 2014-11-08 ENCOUNTER — Encounter: Payer: Self-pay | Admitting: Oncology

## 2014-11-08 ENCOUNTER — Inpatient Hospital Stay: Payer: BLUE CROSS/BLUE SHIELD

## 2014-11-08 VITALS — BP 109/72 | HR 92 | Temp 97.3°F | Wt 123.0 lb

## 2014-11-08 VITALS — BP 112/68 | HR 72 | Resp 14 | Ht 64.0 in | Wt 126.0 lb

## 2014-11-08 DIAGNOSIS — Z79899 Other long term (current) drug therapy: Secondary | ICD-10-CM | POA: Insufficient documentation

## 2014-11-08 DIAGNOSIS — Z85820 Personal history of malignant melanoma of skin: Secondary | ICD-10-CM | POA: Diagnosis not present

## 2014-11-08 DIAGNOSIS — Z9221 Personal history of antineoplastic chemotherapy: Secondary | ICD-10-CM | POA: Diagnosis not present

## 2014-11-08 DIAGNOSIS — Z171 Estrogen receptor negative status [ER-]: Secondary | ICD-10-CM | POA: Diagnosis not present

## 2014-11-08 DIAGNOSIS — C50511 Malignant neoplasm of lower-outer quadrant of right female breast: Secondary | ICD-10-CM | POA: Diagnosis not present

## 2014-11-08 DIAGNOSIS — C50911 Malignant neoplasm of unspecified site of right female breast: Secondary | ICD-10-CM

## 2014-11-08 DIAGNOSIS — C801 Malignant (primary) neoplasm, unspecified: Secondary | ICD-10-CM

## 2014-11-08 MED ORDER — HEPARIN SOD (PORK) LOCK FLUSH 100 UNIT/ML IV SOLN
500.0000 [IU] | Freq: Once | INTRAVENOUS | Status: AC
  Administered 2014-11-08: 500 [IU] via INTRAVENOUS
  Filled 2014-11-08: qty 5

## 2014-11-08 MED ORDER — SODIUM CHLORIDE 0.9 % IJ SOLN
10.0000 mL | INTRAMUSCULAR | Status: AC | PRN
  Administered 2014-11-08: 10 mL via INTRAVENOUS
  Filled 2014-11-08: qty 10

## 2014-11-08 NOTE — Progress Notes (Signed)
Patient does not have living will.  Never smoked. 

## 2014-11-08 NOTE — Progress Notes (Signed)
Patient ID: Kiara Grant, female   DOB: December 05, 1970, 44 y.o.   MRN: [id number]  Chief Complaint  Patient presents with  . Follow-up    right breast wide excision    HPI KEYLI Grant is a 44 y.o. female here today for her post op right breast wide excison done on 10/28/14. Patient states she is doing well HPI  Past Medical History  Diagnosis Date  . Cancer 03-24-14    Right breast, microcalcifications. ER negative, PR less than 10%, HER-2/neu not amplified.  . Melanoma Sept 2011    Left neck T1a, 0.35 mm. Treated by Mohs injury [facility].  . Breast cancer 03/2014    Right, T1b, N1 prior to neo-adjuvant chemotherapy. ER negative, PR less than 10%, HER-2/neu not overexpressed.  Marland Kitchen GERD (gastroesophageal reflux disease)   . PONV (postoperative nausea and vomiting)     Past Surgical History  Procedure Laterality Date  . Breast biopsy Right 03-24-14  . Melanoma excision  Nov 2011    neck  . Portacath placement  Dec 2015  . Breast lumpectomy with sentinel lymph node biopsy Right 10/28/2014    Procedure: right breast wide excision with sentinel node biopsy, mastoplasty, axillary dissection ;  Surgeon: Robert Bellow, MD;  Location: [facility] ORS;  Service: General;  Laterality: Right;    Family History  Problem Relation Age of Onset  . Cancer Mother     lung ? mid 70    Social History History  Substance Use Topics  . Smoking status: Never Smoker   . Smokeless tobacco: Never Used  . Alcohol Use: 0.0 oz/week    0 Standard drinks or equivalent per week     Comment: 3/week    Allergies  Allergen Reactions  . Sulfa Antibiotics Nausea Only    Current Outpatient Prescriptions  Medication Sig Dispense Refill  . doxycycline (VIBRA-TABS) 100 MG tablet Take 1 tablet (100 mg total) by mouth 2 (two) times daily. 14 tablet 0  . loratadine-pseudoephedrine (CLARITIN-D 24-HOUR) 10-240 MG per 24 hr tablet Take 1 tablet by mouth as needed.     Marland Kitchen omeprazole (PRILOSEC) 20 MG capsule  Take 1 capsule (20 mg total) by mouth 2 (two) times daily before a meal. 60 capsule 3  . ondansetron (ZOFRAN) 4 MG tablet Take 4 mg by mouth every 6 (six) hours as needed for nausea or vomiting.    . promethazine (PHENERGAN) 25 MG tablet Take 1 tablet (25 mg total) by mouth every 6 (six) hours as needed for nausea or vomiting. 12 tablet 0  . senna (SENOKOT) 8.6 MG TABS tablet Take 1 tablet by mouth daily as needed for mild constipation.     No current facility-administered medications for this visit.   Facility-Administered Medications Ordered in Other Visits  Medication Dose Route Frequency Provider Last Rate Last Dose  . sodium chloride 0.9 % injection 10 mL  10 mL Intracatheter PRN [name], MD   10 mL at 08/23/14 1430  . sodium chloride 0.9 % injection 10 mL  10 mL Intracatheter PRN [name], MD   10 mL at 09/06/14 1400  . sodium chloride 0.9 % injection 10 mL  10 mL Intracatheter PRN [name], MD   10 mL at 09/13/14 1430  . sodium chloride 0.9 % injection 10 mL  10 mL Intravenous PRN Evlyn Kanner, NP   10 mL at 11/08/14 1207    Review of Systems Review of Systems  Constitutional: Negative.   Respiratory: Negative.  Cardiovascular: Negative.     Blood pressure 112/68, pulse 72, resp. rate 14, height 5' 4"  (1.626 m), weight 126 lb (57.153 kg).  Physical Exam Physical Exam  Constitutional: She is oriented to person, place, and time. She appears well-nourished.  Eyes: Conjunctivae are normal. No scleral icterus.  Neck: Neck supple.  Pulmonary/Chest:    Neurological: She is alert and oriented to person, place, and time.  Skin: Skin is warm and dry.  Drain was removed  Data Reviewed Pathology showed 3 nodes positive. No treatment effect from neo-adjuvant chemotherapy.  Assessment    Doing well status post right breast wide excision, mastoplasty and axillary dissection.    Plan    The drain was removed without incident. Culture of the drain tract was  obtained based on her report of fever 4 days ago. No clinical evidence of infection. She will complete the previously prescribed course of doxycycline. She has been advised that it is not uncommon to develop a fluid collection posterior and removal and to report this is symptomatic.  An appointment has been made with radiation oncology for whole breast/lymph node basin radiation.    Patient to return in one week PCP:  [place]   Robert Bellow 11/08/2014, 8:33 PM

## 2014-11-08 NOTE — Patient Instructions (Signed)
Patient to return in one week. 

## 2014-11-08 NOTE — Progress Notes (Signed)
[place] @ [facility] [phone]  Fax:([phone]     DAILEY ALBERSON OB: [birth year]  MR#: [id number]  CNO#:[id number]  Patient Care Team: Chad Cordial, PA-C as PCP - General (Physician Assistant) Robert Bellow, MD (General Surgery) Chad Cordial, PA-C as Referring Physician (Physician Assistant) [name], MD (Oncology)  CHIEF COMPLAINT:  Chief Complaint  Patient presents with  . Follow-up    Oncology History   1. Carcinoma of right breast T1 be N1 M0 tumor stage II diagnosis in December of 2015 (right lower and outer quadrant). Needle biopsy positive of the right, Axillary lymph node (December, 2015), positive for metastatic breast carcinoma. Estrogen receptor negative.  Progesterone receptor weakly positive.  HER-2/neu receptor negative. 2. Chemotherapy with Cytoxan, Adriamycin,  followed by Taxol (neoadjuvant therapy) April 25, 2014., 3.finished 4 cycles of chemotherapy with Cytoxan and Adriamycin on 7 th  March, 2016 4.started Taxol on a weekly basis from March 29 5.  Patient will finish weekly Taxol on 13th of June, 2016 6.  Patient had lumpectomy and axillary node evaluation. pT1b  pN1 cMO tumor was found.  (July, 2016)      Breast cancer    Oncology Flowsheet 08/30/2014 09/06/2014 09/13/2014 09/20/2014 09/27/2014 10/04/2014 10/28/2014  Day, Cycle Day 15, Cycle 2 Day 22, Cycle 2 Day 1, Cycle 3 Day 8, Cycle 3 Day 15, Cycle 3 Day 22, Cycle 3 -  dexamethasone (DECADRON) IJ - - - - - - -  dexamethasone (DECADRON) IV [ 20 mg ] [ 20 mg ] [ 20 mg ] [ 20 mg ] [ 20 mg ] [ 20 mg ] -  ondansetron (ZOFRAN) IV [ 8 mg ] [ 8 mg ] [ 8 mg ] [ 8 mg ] [ 8 mg ] [ 8 mg ]    PACLitaxel (TAXOL) IV 80 mg/m2 80 mg/m2 80 mg/m2 80 mg/m2 80 mg/m2 80 mg/m2 -    INTERVAL HISTORY: 44 year old lady with stage II carcinoma of breast on adjuvant chemotherapy.  Here for further evaluation for next cycle of Taxol treatment.  Tolerating treatment very well.  Intermittent tingling  numbness.  Not interfering with her work.  Alopecia.  No nausea.  No vomiting.  No diarrhea. July, 2016 Patient is here postoperative follow-up. Patient had lumpectomy and lymph node evaluation patient has persistent disease in the breast and persistent disease with macro metastases disease in the 3 lymph node. 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 Over the weekend patient had high fever was started on antibiotic felt better now.  Here for further follow-up and planning of further treatment  REVIEW OF SYSTEMS:   Gen. status: Patient is in good performance status.  No chills or fever Performance status is 0 HEENT: No soreness in the mouth no difficulty swallowing Lungs: No cough or shortness of breath Cardiac: No chest pain.  No shortness of breath.  No paroxysmal nocturnal dyspnea. GI: Normal.  No nausea.  No vomiting.  No diarrhea.  No abdominal pain Lower extremity no edema Skin: No rash Neurological system: No headache no dizziness.  No other focal symptoms.  Intermittent tingling numbness in upper and lower extremity GU: No dysuria or hematuria  As per HPI. Otherwise, a complete review of systems is negatve.  PAST MEDICAL HISTORY: Past Medical History  Diagnosis Date  . Cancer 03-24-14    Right breast, microcalcifications. ER negative, PR less than 10%, HER-2/neu not amplified.  . Melanoma Sept 2011  Left neck T1a, 0.35 mm. Treated by Mohs injury [facility].  . Breast cancer 03/2014    Right, T1b, N1 prior to neo-adjuvant chemotherapy. ER negative, PR less than 10%, HER-2/neu not overexpressed.  Marland Kitchen GERD (gastroesophageal reflux disease)   . PONV (postoperative nausea and vomiting)     PAST SURGICAL HISTORY: Past Surgical History  Procedure Laterality Date  . Breast biopsy Right 03-24-14  . Melanoma excision  Nov 2011    neck  . Portacath placement  Dec 2015  . Breast lumpectomy with sentinel lymph node biopsy Right  10/28/2014    Procedure: right breast wide excision with sentinel node biopsy, mastoplasty, axillary dissection ;  Surgeon: Robert Bellow, MD;  Location: [facility] ORS;  Service: General;  Laterality: Right;    FAMILY HISTORY Family History  Problem Relation Age of Onset  . Cancer Mother     lung ? mid 50    Significant History/PMH:   Melanoma:    Breast Cancer:    Melanoma excision:    Breast Biopsy:   Preventive Screening:  Has patient had any of the following test? Colonscopy  Mammography  Pap Smear (1)   Last Colonoscopy: Never(1)   Last Mammography: 2015(1)   Last Pap Smear: 2015(1)   Smoking History: Smoking History Never Smoked.(1)  PFSH: Family History: noncontributory  Comments: not examinedo family history of breast cancer  Social History: negative alcohol, negative tobacco       ADVANCED DIRECTIVES:  Patient  does not have any advance care directive  HEALTH MAINTENANCE: History  Substance Use Topics  . Smoking status: Never Smoker   . Smokeless tobacco: Never Used  . Alcohol Use: 0.0 oz/week    0 Standard drinks or equivalent per week     Comment: 3/week       Allergies  Allergen Reactions  . Sulfa Antibiotics Nausea Only    Current Outpatient Prescriptions  Medication Sig Dispense Refill  . doxycycline (VIBRA-TABS) 100 MG tablet Take 1 tablet (100 mg total) by mouth 2 (two) times daily. 14 tablet 0  . loratadine-pseudoephedrine (CLARITIN-D 24-HOUR) 10-240 MG per 24 hr tablet Take 1 tablet by mouth as needed.     Marland Kitchen omeprazole (PRILOSEC) 20 MG capsule Take 1 capsule (20 mg total) by mouth 2 (two) times daily before a meal. 60 capsule 3  . ondansetron (ZOFRAN) 4 MG tablet Take 4 mg by mouth every 6 (six) hours as needed for nausea or vomiting.    . senna (SENOKOT) 8.6 MG TABS tablet Take 1 tablet by mouth daily as needed for mild constipation.    . promethazine (PHENERGAN) 25 MG tablet Take 1 tablet (25 mg total) by mouth every 6 (six) hours  as needed for nausea or vomiting. 12 tablet 0   No current facility-administered medications for this visit.   Facility-Administered Medications Ordered in Other Visits  Medication Dose Route Frequency Provider Last Rate Last Dose  . sodium chloride 0.9 % injection 10 mL  10 mL Intracatheter PRN [name], MD   10 mL at 08/23/14 1430  . sodium chloride 0.9 % injection 10 mL  10 mL Intracatheter PRN [name], MD   10 mL at 09/06/14 1400  . sodium chloride 0.9 % injection 10 mL  10 mL Intracatheter PRN [name], MD   10 mL at 09/13/14 1430  . sodium chloride 0.9 % injection 10 mL  10 mL Intravenous PRN Evlyn Kanner, NP   10 mL at 11/08/14 1207  OBJECTIVE:  Filed Vitals:   11/08/14 1240  BP: 109/72  Pulse: 92  Temp: 97.3 F (36.3 C)     Body mass index is 21.11 kg/(m^2).    ECOG FS:0 - Asymptomatic  PHYSICAL EXAM: Goal status: Performance status is good.  Patient has not lost significant weight HEENT: No evidence of stomatitis.  And alopecia Sclera and conjunctivae :: No jaundice.   pale looking . Lungs: Air  entry equal on both sides.  No rhonchi.  No rales.  Cardiac: Heart sounds are normal.  No pericardial rub.  No murmur. Lymphatic system: Cervical, axillary, inguinal, lymph nodes not palpable GI: Abdomen is soft.  No ascites.  Liver spleen not palpable.  No tenderness.  Bowel sounds are within normal limit Lower extremity: No edema Neurological system: Higher functions, cranial nerves intact No evidence of peripheral neuropathy. Skin: No rash.  No ecchymosis.Marland Kitchen     LAB RESULTS:  No visits with results within 3 Day(s) from this visit. Latest known visit with results is:  Admission on 10/28/2014, Discharged on 10/28/2014  Component Date Value Ref Range Status  . SURGICAL PATHOLOGY 10/28/2014    Final                   Value:Surgical Pathology CASE: [id number] PATIENT: Mertha Baars Surgical Pathology Report     SPECIMEN SUBMITTED: A. Lymph  node #1, sentinel B. Breast, right, wide excision C. Right axillary contents  CLINICAL HISTORY: None provided  PRE-OPERATIVE DIAGNOSIS: Right breast cancer  POST-OPERATIVE DIAGNOSIS: Right breast cancer     DIAGNOSIS: A. LYMPH NODE #1, SENTINEL; EXCISION: - ONE LYMPH NODE INVOLVED BY MACROMETASTASIS (1.6 CM).  B. RIGHT BREAST; WIDE EXCISION: - INVASIVE MAMMARY CARCINOMA OF NO SPECIAL TYPE, 6 MM. - HIGH-GRADE DUCTAL CARCINOMA IN SITU EXTENDING TO INVOLVE ADJACENT LOBULES. - MICROCALCIFICATIONS INVOLVING BENIGN AND MALIGNANT TISSUE. - THE MARGINS OF EXCISION ARE NEGATIVE (CLOSEST MARGIN TO IN SITU AND INVASIVE CARCINOMA IS POSTERIOR/PECTORALIS FASCIA). - SEE CANCER CASE SUMMARY BELOW.  C. RIGHT AXILLARY CONTENTS; DISSECTION: - TWO OF ELEVEN LYMPH NODES INVOLVED BY MACROMETASTASIS (2/11).   INVASIVE CARCINOMA OF THE BREAST: Comp                         lete Excision (Less Than Total Mastectomy, Including Specimens Designated Biopsy, Lumpectomy, Quadrantectomy, and Partial Mastectomy With or Without Axillary Contents) and Mastectomy (Total, Modified Radical, Radical With or Without Axillary Contents)Radical, Radical With or Without Axillary Contents) Specimens InvolvedB: Breast, right, wide excision  Breast Invasive Carcinoma Cancer Case Summary SPECIMEN Procedure:     Excision without wire-guided localization Lymph Node Sampling:     Sentinel lymph node(s) Axillary dissection (partial or complete dissection) Specimen Laterality:     Right TUMOR Presence of Invasive Carcinoma:    Histologic Type of Invasive Carcinoma: Invasive ductal carcinoma (no special type or not otherwise specified) Histologic Grade: Nottingham Histologic Score Glandular (Acinar) / Tubular Differentiation: Score 3: < 10% of tumor area forming glandular / tubular structures Nuclear Pleomorphism: Score 3: Vesicular nuclei, often with prominent                          nucleoli,  exhibiting marked variation in size and shape, occasionally with very large and bizarre forms Mitotic Rate Score 3 (>=8 mitoses per mm2) Overall Grade: Grade 3: scores of 8 or 9 Ductal Carcinoma In Situ (DCIS):   DCIS is present EXTENT Tumor Size / Focality:  Tumor Size: Size of Largest Invasive Carcinoma: Greatest dimension of largest focus of invasion > 1 mm Greatest Dimension (mm): 0.6cm MARGINS Invasive Carcinoma: Margins uninvolved by invasive carcinoma Distance from Closest Margin  (mm):     Distance (specify in mm) 59m Ductal Carcinoma In Situ (DCIS):   Margins uninvolved by DCIS (DCIS present in specimen) Distance of DCIS from Closest Margin (mm): Distance (specify in mm) 640mLYMPH NODES Status of Lymph Nodes:   Total Number of Lymph Nodes Examined:   Specify 12 Micro / Macro Metastases:     Present Number of Lymph Nodes with Macrometastases (> 2 mm): Specify 3 Number of Lymph Nodes with Micrometastases:  Specify 0 Number of Sentinel Lym                         ph Nodes Examined:     Specify 1 ACCESSORY FINDINGS Lymph-Vascular Invasion: Present STAGE (pTNM) TNM Descriptors:    y (post-treatment) Primary Tumor (Invasive Carcinoma) (pT): pT1b:  Tumor > 5 mm but <= 10 mm in greatest dimension Regional Lymph Nodes (pN) (choose a category based on lymph nodes received with the specimen;  immunohistochemistry and / or molecular studies are not required) Category (pN): pN1a: Metastases in 1 to 3 axillary lymph nodes, at least 1 metastasis greater than 2.0 mm## Distant Metastasis (pM): N/A  Note: Treatment Effect: Response to Presurgical (Neoadjuvant) Therapy  In the Breast: No definite response to presurgical therapy in the invasive carcinoma  In the Lymph Nodes:  No definite response to presurgical therapy in metastatic carcinoma. ER, PR and HER-2/neu immunohistochemistry was performed on a separate specimen: ([id number]). In summary ER negative, PR positive  (1-10%), HER-2 IHC negative.  GROSS DESCRIPTION:  A. Intraoperat                         ive Consultation:     Received:  fresh     Specimen: sentinel node #1     Pathologic Evaluation: touch prep     Diagnosis: Sentinel lymph node #1; excision: Positive for malignant cells by touch prep.     Lymph node grossly involved by tumor, measuring at least 1.1 cm     Communicated to: Dr. ByBary Castillat 10:35 AM on 10/28/2014, DaDelorse Lek.D.     Tissue submitted: not applicable  Labeled: sentinel node #1 Tissue Fragment(s): 1 Measurement: 2.0 x 1.5 x 1.0 cm Comment: specimen is bisected and touch prep prepared, following touch preparation tissue is formalin fixed, further sectioned  Entirely submitted in cassette(s): 1-2  B. Intraoperative Consultation:     Received:  fresh     Specimen: right breast wide excision     Pathologic Evaluation: immediate gross evaluation margin     Diagnosis: right breast new: Excision: Mass and biopsy clip identified. Closest margin is deep, 2 mm     Communicated to: Dr. ByBary Castillat 11:11 AM on 10/28/2014, DaDelorse Lek.D.     T                         issue submitted: not applicable    Labeled: right breast wide excision  Time in fixative:   11:12 AM on 10/28/2014 Total formalin fixation time 9 hours  Cold Ischemic Time: 23 minutes  Type of specimen: excision  Location of specimen: right  Size of specimen: 5.7 (medial-lateral) by 5.0 (superior-inferior) by 1.6 (anterior-posterior) centimeter  Skin: not  present  Direction of compression: anterior-posterior  Needle localization: no  Orientation of specimen: cranial, caudal and medial metallic clips  Superior = blue Inferior = green Medial = yellow Lateral = orange Posterior = black Anterior/Superficial = red    Plane of sectioning: superior-inferior  Biopsy site: present  Presence/absence of discrete mass: present  Number of discrete masses: 1  Size(s) of mass(es): 0.9 x 0.8  x 0.7 cm  Description of mass(es): well-circumscribed rubbery tan  Distance between masses/clips: not applicable  Distance of mass(es)/ biopsy site/clip to surgical                          margins: deep-0.1 centimeters, anterior-1.1 cm, inferior-1.1 cm, superior-1.0 cm, lateral-0.9 cm and medial-2.6 cm  Description of remainder of tissue: diffusely fibrous  Other remarkable features: focal blue ink dye  Tissue submitted for special investigation: not applicable  Block summary: 1-4-entire mass with perpendicular margins from lateral towards medial (#2 containing clip) 5-perpendicular lateral margin 6-perpendicular medial margin  C.  Labeled: right axillary contents Tissue Fragment(s): 1 Measurement: 6.5 x 6.2 x 1.1 cm Comment: fragment of yellow lobulated fibrofatty tissue, dissected for lymph node candidates  Entirely submitted in cassette(s):  1-5 lymph node candidates 2-5 lymph node candidates 6-3 lymph node candidates     Final Diagnosis performed by Delorse Lek, MD.  Electronically signed 10/31/2014 2:12:54PM    The electronic signature indicates that the named Attending Pathologist has evaluated the specimen  Technical component                          performed at [facility], [address], [place], [place] [address] Lab: [phone] Dir: Darrick Penna. Evette Doffing, MD  Professional component performed at [facility], [facility], [place], [place], [place] [address] Lab: [phone] Dir: Dellia Nims. Reuel Derby, MD      Lab Results  Component Value Date   LABCA2 10.1 04/25/2014   No results found for: CA199 Lab Results  Component Value Date   CEA 1.6 03/30/2014   No results found for: PSA No results found for: CA125    ASSESSMENT: 44 year old lady with states to carcinoma breast here for continuation of Taxol chemotherapy Patient had a recent lumpectomy and lymph node evaluation Postsurgical stage II disease  MEDICAL DECISION  MAKING:  All lab data and pathology report has been reviewed. Patient did not have not very significant response to the chemotherapy We will recheck molecular markers particularly on the lymph node A patient is still triple negative disease would not benefit by anti-hormonal manipulation For better local control radiation therapy is indicated Ace and will be referred to Dr. Baruch Gouty for radiation treatment  Patient expressed understanding and was in agreement with this plan. She also understands that She can call clinic at any time with any questions, concerns, or complaints.    Breast cancer   Staging form: Breast, AJCC 7th Edition     Clinical: Stage IIA (T1b, N1, M0) - Signed by [name], MD on 08/01/2014   [name], MD   11/08/2014 1:11 PM

## 2014-11-12 LAB — ANAEROBIC AND AEROBIC CULTURE

## 2014-11-14 ENCOUNTER — Encounter: Payer: Self-pay | Admitting: General Surgery

## 2014-11-14 ENCOUNTER — Ambulatory Visit (INDEPENDENT_AMBULATORY_CARE_PROVIDER_SITE_OTHER): Payer: BLUE CROSS/BLUE SHIELD | Admitting: General Surgery

## 2014-11-14 VITALS — BP 116/74 | HR 72 | Resp 14 | Ht 64.0 in | Wt 123.0 lb

## 2014-11-14 DIAGNOSIS — C50911 Malignant neoplasm of unspecified site of right female breast: Secondary | ICD-10-CM

## 2014-11-14 NOTE — Patient Instructions (Signed)
Patient to return in two weeks  

## 2014-11-14 NOTE — Progress Notes (Signed)
Patient ID: Kiara Grant, female   DOB: 1970/12/12, 44 y.o.   MRN: [id number]  Chief Complaint  Patient presents with  . Follow-up    right breast wide excision    HPI Kiara Grant is a 44 y.o. female today for her post op right breast wide excison done on 10/28/14. Patient states she is doing well. She tolerated the anabiotic's prescribed after she reported a fever without difficulty. HPI  Past Medical History  Diagnosis Date  . Cancer 03-24-14    Right breast, microcalcifications. ER negative, PR less than 10%, HER-2/neu not amplified.  . Melanoma Sept 2011    Left neck T1a, 0.35 mm. Treated by Mohs injury [facility].  . Breast cancer 03/2014    Right, T1b, N1 prior to neo-adjuvant chemotherapy. ER negative, PR less than 10%, HER-2/neu not overexpressed.  Marland Kitchen GERD (gastroesophageal reflux disease)   . PONV (postoperative nausea and vomiting)     Past Surgical History  Procedure Laterality Date  . Breast biopsy Right 03-24-14  . Melanoma excision  Nov 2011    neck  . Portacath placement  Dec 2015  . Breast lumpectomy with sentinel lymph node biopsy Right 10/28/2014    Procedure: right breast wide excision with sentinel node biopsy, mastoplasty, axillary dissection ;  Surgeon: Robert Bellow, MD;  Location: [facility] ORS;  Service: General;  Laterality: Right;    Family History  Problem Relation Age of Onset  . Cancer Mother     lung ? mid 92    Social History History  Substance Use Topics  . Smoking status: Never Smoker   . Smokeless tobacco: Never Used  . Alcohol Use: 0.0 oz/week    0 Standard drinks or equivalent per week     Comment: 3/week    Allergies  Allergen Reactions  . Sulfa Antibiotics Nausea Only    Current Outpatient Prescriptions  Medication Sig Dispense Refill  . doxycycline (VIBRA-TABS) 100 MG tablet Take 1 tablet (100 mg total) by mouth 2 (two) times daily. 14 tablet 0  . loratadine-pseudoephedrine (CLARITIN-D 24-HOUR) 10-240 MG per 24 hr  tablet Take 1 tablet by mouth as needed.     Marland Kitchen omeprazole (PRILOSEC) 20 MG capsule Take 1 capsule (20 mg total) by mouth 2 (two) times daily before a meal. 60 capsule 3  . ondansetron (ZOFRAN) 4 MG tablet Take 4 mg by mouth every 6 (six) hours as needed for nausea or vomiting.    . promethazine (PHENERGAN) 25 MG tablet Take 1 tablet (25 mg total) by mouth every 6 (six) hours as needed for nausea or vomiting. 12 tablet 0  . senna (SENOKOT) 8.6 MG TABS tablet Take 1 tablet by mouth daily as needed for mild constipation.     No current facility-administered medications for this visit.   Facility-Administered Medications Ordered in Other Visits  Medication Dose Route Frequency Provider Last Rate Last Dose  . sodium chloride 0.9 % injection 10 mL  10 mL Intracatheter PRN [name], MD   10 mL at 08/23/14 1430  . sodium chloride 0.9 % injection 10 mL  10 mL Intracatheter PRN [name], MD   10 mL at 09/06/14 1400  . sodium chloride 0.9 % injection 10 mL  10 mL Intracatheter PRN [name], MD   10 mL at 09/13/14 1430  . sodium chloride 0.9 % injection 10 mL  10 mL Intravenous PRN Evlyn Kanner, NP   10 mL at 11/08/14 1207    Review of  Systems Review of Systems  Constitutional: Negative.   Respiratory: Negative.   Cardiovascular: Negative.     Blood pressure 116/74, pulse 72, resp. rate 14, height 5' 4"  (1.626 m), weight 123 lb (55.792 kg).  Physical Exam Physical Exam  Constitutional: She is oriented to person, place, and time. She appears well-developed and well-nourished.  Eyes: Conjunctivae are normal. No scleral icterus.  Neck: Neck supple.  Cardiovascular: Normal rate, regular rhythm and normal heart sounds.   Pulmonary/Chest: Effort normal and breath sounds normal.    Right breast incision looks clean and healing well.   Lymphadenopathy:    She has no cervical adenopathy.  Neurological: She is alert and oriented to person, place, and time.  Skin: Skin is dry.     Data Reviewed Wound culture showed scant growth of staph aureus sensitive to tetracycline.  Assessment    Doing well status post Rein removal. No evidence of ongoing infection.    Plan    She'll increase her activity as tolerated. Follow up with radiation oncology next week.    Patient to return un 2 weeks.   PCP:  Kathlyn Sacramento 11/15/2014, 4:58 PM

## 2014-11-15 LAB — SURGICAL PATHOLOGY

## 2014-11-23 ENCOUNTER — Ambulatory Visit
Admission: RE | Admit: 2014-11-23 | Discharge: 2014-11-23 | Disposition: A | Discharge status: Home / Self Care | Payer: BLUE CROSS/BLUE SHIELD | Source: Ambulatory Visit | Attending: Radiation Oncology | Admitting: Radiation Oncology

## 2014-11-23 ENCOUNTER — Encounter: Payer: Self-pay | Admitting: Radiation Oncology

## 2014-11-23 VITALS — BP 108/77 | HR 85 | Temp 97.8°F | Resp 18 | Wt 124.1 lb

## 2014-11-23 DIAGNOSIS — Z510 Encounter for antineoplastic radiation therapy: Secondary | ICD-10-CM | POA: Insufficient documentation

## 2014-11-23 DIAGNOSIS — C773 Secondary and unspecified malignant neoplasm of axilla and upper limb lymph nodes: Secondary | ICD-10-CM | POA: Insufficient documentation

## 2014-11-23 DIAGNOSIS — Z171 Estrogen receptor negative status [ER-]: Secondary | ICD-10-CM | POA: Insufficient documentation

## 2014-11-23 DIAGNOSIS — Z853 Personal history of malignant neoplasm of breast: Secondary | ICD-10-CM

## 2014-11-23 DIAGNOSIS — C50511 Malignant neoplasm of lower-outer quadrant of right female breast: Secondary | ICD-10-CM | POA: Insufficient documentation

## 2014-11-23 NOTE — Consult Note (Signed)
Except an outstanding is perfect of Radiation Oncology NEW PATIENT EVALUATION  Name: Kiara Grant  MRN: [id number]  Date:   11/23/2014     DOB: 08/29/1970   This 44 y.o. 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  REFERRING PHYSICIAN: Copland, Deirdre Evener, PA-C  CHIEF COMPLAINT:  Chief Complaint  Patient presents with  . Breast Cancer    Right Breast    DIAGNOSIS: The encounter diagnosis was Personal history of malignant neoplasm of breast.   PREVIOUS INVESTIGATIONS:  Mammograms and ultrasound reviewed Pathology reports reviewed Clinical notes reviewed Case presented at breast cancer conference  HPI: patient is a pleasant 44 year old femalewho presented at the end of 2015 with an abnormal mammogram showing a abnormality in the right breast in the lower outer quadrant. She also had abnormal axillary lymph nodes seen on ultrasound and needle biopsy of right axillary lymph node was positive for metastatic breast cancer. Tumor was triple negative. She went on to have new adjuvant chemotherapy with Cytoxan Adriamycin followed by Taxol. She went on to have a wide local excision and axillary dissection showing residual invasive mammary carcinoma 6 mm. One sentinel lymph node had macro metastatic disease a 1.6 cm and again 2 more of 11 lymph nodes positive for macro metastatic disease.there was lymphovascular invasion present.margins were clear at 2 mm. Tumor was overall score of 3. Tumor showed very little response to new adjuvant chemotherapy. Her case was presented at our breast cancer conference and recommendation for adjuvant right breast and peripheral lymphatic radiation was made. She seen today and doing well. She's recovered nicely from her chemotherapy and surgery. She specifically  denies breast tenderness cough or bone pain.  PLANNED TREATMENT REGIMEN: whole breast and peripheral lymphatic radiation  PAST MEDICAL HISTORY:  has a past medical history of Cancer (03-24-14); Melanoma (Sept 2011); Breast cancer (03/2014); GERD (gastroesophageal reflux disease); and PONV (postoperative nausea and vomiting).    PAST SURGICAL HISTORY:  Past Surgical History  Procedure Laterality Date  . Breast biopsy Right 03-24-14  . Melanoma excision  Nov 2011    neck  . Portacath placement  Dec 2015  . Breast lumpectomy with sentinel lymph node biopsy Right 10/28/2014    Procedure: right breast wide excision with sentinel node biopsy, mastoplasty, axillary dissection ;  Surgeon: Robert Bellow, MD;  Location: [facility] ORS;  Service: General;  Laterality: Right;    FAMILY HISTORY: family history includes Cancer in her mother.  SOCIAL HISTORY:  reports that she has never smoked. She has never used smokeless tobacco. She reports that she drinks alcohol. She reports that she does not use illicit drugs.  ALLERGIES: Sulfa antibiotics  MEDICATIONS:  Current Outpatient Prescriptions  Medication Sig Dispense Refill  . doxycycline (VIBRA-TABS) 100 MG tablet Take 1 tablet (100 mg total) by mouth 2 (two) times daily. 14 tablet 0  . loratadine-pseudoephedrine (CLARITIN-D 24-HOUR) 10-240 MG per 24 hr tablet Take 1 tablet by mouth as needed.     Marland Kitchen omeprazole (PRILOSEC) 20 MG capsule Take 1 capsule (20 mg total) by mouth 2 (two) times daily before a meal. 60 capsule 3  . ondansetron (ZOFRAN) 4 MG tablet Take 4 mg by mouth every 6 (six) hours as needed for nausea or vomiting.    . promethazine (PHENERGAN) 25 MG tablet Take 1  tablet (25 mg total) by mouth every 6 (six) hours as needed for nausea or vomiting. 12 tablet 0  . senna (SENOKOT) 8.6 MG TABS tablet Take 1 tablet by mouth daily as needed for mild constipation.     No current facility-administered medications for this encounter.    Facility-Administered Medications Ordered in Other Encounters  Medication Dose Route Frequency Provider Last Rate Last Dose  . sodium chloride 0.9 % injection 10 mL  10 mL Intracatheter PRN [name], MD   10 mL at 08/23/14 1430  . sodium chloride 0.9 % injection 10 mL  10 mL Intracatheter PRN [name], MD   10 mL at 09/06/14 1400  . sodium chloride 0.9 % injection 10 mL  10 mL Intracatheter PRN [name], MD   10 mL at 09/13/14 1430  . sodium chloride 0.9 % injection 10 mL  10 mL Intravenous PRN Evlyn Kanner, NP   10 mL at 11/08/14 1207    ECOG PERFORMANCE STATUS:  0 - Asymptomatic  REVIEW OF SYSTEMS:  Patient denies any weight loss, fatigue, weakness, fever, chills or night sweats. Patient denies any loss of vision, blurred vision. Patient denies any ringing  of the ears or hearing loss. No irregular heartbeat. Patient denies heart murmur or history of fainting. Patient denies any chest pain or pain radiating to her upper extremities. Patient denies any shortness of breath, difficulty breathing at night, cough or hemoptysis. Patient denies any swelling in the lower legs. Patient denies any nausea vomiting, vomiting of blood, or coffee ground material in the vomitus. Patient denies any stomach pain. Patient states has had normal bowel movements no significant constipation or diarrhea. Patient denies any dysuria, hematuria or significant nocturia. Patient denies any problems walking, swelling in the joints or loss of balance. Patient denies any skin changes, loss of hair or loss of weight. Patient denies any excessive worrying or anxiety or significant depression. Patient denies any problems with insomnia. Patient denies excessive thirst, polyuria, polydipsia. Patient denies any swollen glands, patient denies easy bruising or easy bleeding. Patient denies any recent infections, allergies or URI. Patient "s visual fields have not changed significantly in recent time.    PHYSICAL  EXAM: BP 108/77 mmHg  Pulse 85  Temp(Src) 97.8 F (36.6 C)  Resp 18  Wt 124 lb 1.9 oz (56.3 kg) Well-developed thin female in NAD. Breasts are symmetric. No dominant mass or nodularity is noted in either breast in 2 positions examined. No axillary or supraclavicular adenopathy is identified. No lymphedema in her right upper extremity is noted. Well-developed well-nourished patient in NAD. HEENT reveals PERLA, EOMI, discs not visualized.  Oral cavity is clear. No oral mucosal lesions are identified. Neck is clear without evidence of cervical or supraclavicular adenopathy. Lungs are clear to A&P. Cardiac examination is essentially unremarkable with regular rate and rhythm without murmur rub or thrill. Abdomen is benign with no organomegaly or masses noted. Motor sensory and DTR levels are equal and symmetric in the upper and lower extremities. Cranial nerves II through XII are grossly intact. Proprioception is intact. No peripheral adenopathy or edema is identified. No motor or sensory levels are noted. Crude visual fields are within normal range.   LABORATORY DATA: pathology report reviewed    RADIOLOGY RESULTS:mammograms and ultrasound reviewed   IMPRESSION: stage II invasive mammary carcinoma the right breast status post new adjuvant chemotherapy and wide local excision and axillary lymph node dissection in 44 year old female with triple negative disease for adjuvant whole breast  and peripheral lymphatic radiation  PLAN: at this time based on a poor prognostic factors including poor response to chemotherapy, triple negative nature of her disease, and 3 positive macro metastatic lymph nodes would recommend right breast and peripheral lymphatic radiation. Would treat up to 5000 cGy to both areas boosting her scar another 1400 cGy using electron beam. Risks and benefits of treatment including skin reaction, fatigue, thickening of the breast, alteration of blood counts, inclusion of some superficial  lung, and's possibility of lymphedema all were explained in detail to the patient and her husband. I visit emphasized the need for her to exercise the right upper extremity to prevent lymphedema. I have set up and ordered CT simulation for this week.  I would like to take this opportunity for allowing me to participate in the care of your patient.Armstead Peaks., MD

## 2014-11-24 ENCOUNTER — Ambulatory Visit
Admission: RE | Admit: 2014-11-24 | Discharge: 2014-11-24 | Disposition: A | Discharge status: Home / Self Care | Payer: BLUE CROSS/BLUE SHIELD | Source: Ambulatory Visit | Attending: Radiation Oncology | Admitting: Radiation Oncology

## 2014-11-24 DIAGNOSIS — C773 Secondary and unspecified malignant neoplasm of axilla and upper limb lymph nodes: Secondary | ICD-10-CM | POA: Diagnosis not present

## 2014-11-24 DIAGNOSIS — C50511 Malignant neoplasm of lower-outer quadrant of right female breast: Secondary | ICD-10-CM | POA: Diagnosis present

## 2014-11-24 DIAGNOSIS — Z510 Encounter for antineoplastic radiation therapy: Secondary | ICD-10-CM | POA: Diagnosis not present

## 2014-11-24 DIAGNOSIS — Z171 Estrogen receptor negative status [ER-]: Secondary | ICD-10-CM | POA: Diagnosis not present

## 2014-11-25 ENCOUNTER — Other Ambulatory Visit: Payer: Self-pay | Admitting: *Deleted

## 2014-11-25 DIAGNOSIS — C50911 Malignant neoplasm of unspecified site of right female breast: Secondary | ICD-10-CM

## 2014-11-25 DIAGNOSIS — C50511 Malignant neoplasm of lower-outer quadrant of right female breast: Secondary | ICD-10-CM | POA: Diagnosis not present

## 2014-11-29 ENCOUNTER — Encounter: Payer: Self-pay | Admitting: General Surgery

## 2014-11-29 ENCOUNTER — Ambulatory Visit (INDEPENDENT_AMBULATORY_CARE_PROVIDER_SITE_OTHER): Payer: BLUE CROSS/BLUE SHIELD | Admitting: General Surgery

## 2014-11-29 VITALS — BP 118/68 | HR 70 | Resp 12 | Ht 64.0 in | Wt 117.0 lb

## 2014-11-29 DIAGNOSIS — C50911 Malignant neoplasm of unspecified site of right female breast: Secondary | ICD-10-CM

## 2014-11-29 NOTE — Progress Notes (Signed)
Patient ID: Kiara Grant, female   DOB: 08-Mar-1971, 44 y.o.   MRN: [id number]  Chief Complaint  Patient presents with  . Follow-up    right breast wide excision     HPI Kiara Grant is a 44 y.o. female here today for a right breast wide excision done on 10/28/14. Patient states she is doing well.  HPI  Past Medical History  Diagnosis Date  . Cancer 03-24-14    Right breast, microcalcifications. ER negative, PR less than 10%, HER-2/neu not amplified.  . Melanoma Sept 2011    Left neck T1a, 0.35 mm. Treated by Mohs injury [facility].  . Breast cancer 03/2014    Right, T1b, N1 prior to neo-adjuvant chemotherapy. ER negative, PR less than 10%, HER-2/neu not overexpressed.  Marland Kitchen GERD (gastroesophageal reflux disease)   . PONV (postoperative nausea and vomiting)     Past Surgical History  Procedure Laterality Date  . Breast biopsy Right 03-24-14  . Melanoma excision  Nov 2011    neck  . Portacath placement  Dec 2015  . Breast lumpectomy with sentinel lymph node biopsy Right 10/28/2014    Procedure: right breast wide excision with sentinel node biopsy, mastoplasty, axillary dissection ;  Surgeon: Robert Bellow, MD;  Location: [facility] ORS;  Service: General;  Laterality: Right;    Family History  Problem Relation Age of Onset  . Cancer Mother     lung ? mid 43    Social History Social History  Substance Use Topics  . Smoking status: Never Smoker   . Smokeless tobacco: Never Used  . Alcohol Use: 0.0 oz/week    0 Standard drinks or equivalent per week     Comment: 3/week    Allergies  Allergen Reactions  . Sulfa Antibiotics Nausea Only    Current Outpatient Prescriptions  Medication Sig Dispense Refill  . doxycycline (VIBRA-TABS) 100 MG tablet Take 1 tablet (100 mg total) by mouth 2 (two) times daily. 14 tablet 0  . loratadine-pseudoephedrine (CLARITIN-D 24-HOUR) 10-240 MG per 24 hr tablet Take 1 tablet by mouth as needed.     Marland Kitchen omeprazole (PRILOSEC) 20 MG capsule  Take 1 capsule (20 mg total) by mouth 2 (two) times daily before a meal. 60 capsule 3  . ondansetron (ZOFRAN) 4 MG tablet Take 4 mg by mouth every 6 (six) hours as needed for nausea or vomiting.    . promethazine (PHENERGAN) 25 MG tablet Take 1 tablet (25 mg total) by mouth every 6 (six) hours as needed for nausea or vomiting. 12 tablet 0  . senna (SENOKOT) 8.6 MG TABS tablet Take 1 tablet by mouth daily as needed for mild constipation.     No current facility-administered medications for this visit.   Facility-Administered Medications Ordered in Other Visits  Medication Dose Route Frequency Provider Last Rate Last Dose  . sodium chloride 0.9 % injection 10 mL  10 mL Intracatheter PRN [name], MD   10 mL at 08/23/14 1430  . sodium chloride 0.9 % injection 10 mL  10 mL Intracatheter PRN [name], MD   10 mL at 09/06/14 1400  . sodium chloride 0.9 % injection 10 mL  10 mL Intracatheter PRN [name], MD   10 mL at 09/13/14 1430  . sodium chloride 0.9 % injection 10 mL  10 mL Intravenous PRN Evlyn Kanner, NP   10 mL at 11/08/14 1207    Review of Systems Review of Systems  Constitutional: Negative.   Respiratory:  Negative.   Cardiovascular: Negative.     Blood pressure 118/68, pulse 70, resp. rate 12, height 5' 4"  (1.626 m), weight 117 lb (53.071 kg).  Physical Exam Physical Exam  Constitutional: She is oriented to person, place, and time. She appears well-developed and well-nourished.  Pulmonary/Chest:  Right breast incision is clean and healing well.  Neurological: She is alert and oriented to person, place, and time.  Skin: Skin is warm and dry.       Assessment    Doing well status post wide excision and axillary node dissection. No evidence of upper extremity edema.    Plan    We'll plan for follow-up exam after completion of her upcoming radiation therapy scheduled to begin on 12/05/2014.   Patient to return in two months. Continue self breast exams. Call  office for any new breast issues or concerns.  PCP:  Kathlyn Sacramento 12/01/2014, 9:21 AM

## 2014-11-29 NOTE — Patient Instructions (Signed)
Patient to return in two months. Continue self breast exams. Call office for any new breast issues or concerns.

## 2014-12-01 ENCOUNTER — Ambulatory Visit
Admission: RE | Admit: 2014-12-01 | Discharge: 2014-12-01 | Disposition: A | Discharge status: Home / Self Care | Payer: BLUE CROSS/BLUE SHIELD | Source: Ambulatory Visit | Attending: Radiation Oncology | Admitting: Radiation Oncology

## 2014-12-01 DIAGNOSIS — C50511 Malignant neoplasm of lower-outer quadrant of right female breast: Secondary | ICD-10-CM | POA: Diagnosis not present

## 2014-12-05 ENCOUNTER — Ambulatory Visit
Admission: RE | Admit: 2014-12-05 | Discharge: 2014-12-05 | Disposition: A | Discharge status: Home / Self Care | Payer: BLUE CROSS/BLUE SHIELD | Source: Ambulatory Visit | Attending: Radiation Oncology | Admitting: Radiation Oncology

## 2014-12-05 DIAGNOSIS — C50511 Malignant neoplasm of lower-outer quadrant of right female breast: Secondary | ICD-10-CM | POA: Diagnosis not present

## 2014-12-06 ENCOUNTER — Ambulatory Visit
Admission: RE | Admit: 2014-12-06 | Discharge: 2014-12-06 | Disposition: A | Discharge status: Home / Self Care | Payer: BLUE CROSS/BLUE SHIELD | Source: Ambulatory Visit | Attending: Radiation Oncology | Admitting: Radiation Oncology

## 2014-12-06 DIAGNOSIS — C50511 Malignant neoplasm of lower-outer quadrant of right female breast: Secondary | ICD-10-CM | POA: Diagnosis not present

## 2014-12-07 ENCOUNTER — Ambulatory Visit
Admission: RE | Admit: 2014-12-07 | Discharge: 2014-12-07 | Disposition: A | Discharge status: Home / Self Care | Payer: BLUE CROSS/BLUE SHIELD | Source: Ambulatory Visit | Attending: Radiation Oncology | Admitting: Radiation Oncology

## 2014-12-07 DIAGNOSIS — C50511 Malignant neoplasm of lower-outer quadrant of right female breast: Secondary | ICD-10-CM | POA: Diagnosis not present

## 2014-12-08 ENCOUNTER — Ambulatory Visit
Admission: RE | Admit: 2014-12-08 | Discharge: 2014-12-08 | Disposition: A | Discharge status: Home / Self Care | Payer: BLUE CROSS/BLUE SHIELD | Source: Ambulatory Visit | Attending: Radiation Oncology | Admitting: Radiation Oncology

## 2014-12-08 DIAGNOSIS — C50511 Malignant neoplasm of lower-outer quadrant of right female breast: Secondary | ICD-10-CM | POA: Diagnosis not present

## 2014-12-09 ENCOUNTER — Ambulatory Visit
Admission: RE | Admit: 2014-12-09 | Discharge: 2014-12-09 | Disposition: A | Discharge status: Home / Self Care | Payer: BLUE CROSS/BLUE SHIELD | Source: Ambulatory Visit | Attending: Radiation Oncology | Admitting: Radiation Oncology

## 2014-12-09 DIAGNOSIS — C50511 Malignant neoplasm of lower-outer quadrant of right female breast: Secondary | ICD-10-CM | POA: Diagnosis not present

## 2014-12-12 ENCOUNTER — Encounter: Payer: Self-pay | Admitting: General Surgery

## 2014-12-12 ENCOUNTER — Ambulatory Visit
Admission: RE | Admit: 2014-12-12 | Discharge: 2014-12-12 | Disposition: A | Discharge status: Home / Self Care | Payer: BLUE CROSS/BLUE SHIELD | Source: Ambulatory Visit | Attending: Radiation Oncology | Admitting: Radiation Oncology

## 2014-12-12 DIAGNOSIS — C50511 Malignant neoplasm of lower-outer quadrant of right female breast: Secondary | ICD-10-CM | POA: Diagnosis not present

## 2014-12-13 ENCOUNTER — Ambulatory Visit
Admission: RE | Admit: 2014-12-13 | Discharge: 2014-12-13 | Disposition: A | Discharge status: Home / Self Care | Payer: BLUE CROSS/BLUE SHIELD | Source: Ambulatory Visit | Attending: Radiation Oncology | Admitting: Radiation Oncology

## 2014-12-13 DIAGNOSIS — C50511 Malignant neoplasm of lower-outer quadrant of right female breast: Secondary | ICD-10-CM | POA: Diagnosis not present

## 2014-12-14 ENCOUNTER — Ambulatory Visit
Admission: RE | Admit: 2014-12-14 | Discharge: 2014-12-14 | Disposition: A | Discharge status: Home / Self Care | Payer: BLUE CROSS/BLUE SHIELD | Source: Ambulatory Visit | Attending: Radiation Oncology | Admitting: Radiation Oncology

## 2014-12-14 DIAGNOSIS — C50511 Malignant neoplasm of lower-outer quadrant of right female breast: Secondary | ICD-10-CM | POA: Diagnosis not present

## 2014-12-15 ENCOUNTER — Inpatient Hospital Stay: Payer: BLUE CROSS/BLUE SHIELD | Attending: Radiation Oncology

## 2014-12-15 ENCOUNTER — Ambulatory Visit
Admission: RE | Admit: 2014-12-15 | Discharge: 2014-12-15 | Disposition: A | Discharge status: Home / Self Care | Payer: BLUE CROSS/BLUE SHIELD | Source: Ambulatory Visit | Attending: Radiation Oncology | Admitting: Radiation Oncology

## 2014-12-15 ENCOUNTER — Inpatient Hospital Stay: Payer: BLUE CROSS/BLUE SHIELD

## 2014-12-15 DIAGNOSIS — C50919 Malignant neoplasm of unspecified site of unspecified female breast: Secondary | ICD-10-CM

## 2014-12-15 DIAGNOSIS — Z79899 Other long term (current) drug therapy: Secondary | ICD-10-CM | POA: Diagnosis not present

## 2014-12-15 DIAGNOSIS — C50511 Malignant neoplasm of lower-outer quadrant of right female breast: Secondary | ICD-10-CM | POA: Diagnosis present

## 2014-12-15 DIAGNOSIS — Z9221 Personal history of antineoplastic chemotherapy: Secondary | ICD-10-CM | POA: Insufficient documentation

## 2014-12-15 DIAGNOSIS — Z85820 Personal history of malignant melanoma of skin: Secondary | ICD-10-CM | POA: Insufficient documentation

## 2014-12-15 DIAGNOSIS — Z171 Estrogen receptor negative status [ER-]: Secondary | ICD-10-CM | POA: Insufficient documentation

## 2014-12-15 LAB — CBC WITH DIFFERENTIAL/PLATELET
Basophils Absolute: 0 10*3/uL (ref 0–0.1)
Basophils Relative: 1 %
EOS PCT: 3 %
Eosinophils Absolute: 0.1 10*3/uL (ref 0–0.7)
HCT: 40.2 % (ref 35.0–47.0)
Hemoglobin: 13.6 g/dL (ref 12.0–16.0)
LYMPHS ABS: 1 10*3/uL (ref 1.0–3.6)
Lymphocytes Relative: 28 %
MCH: 30.7 pg (ref 26.0–34.0)
MCHC: 33.8 g/dL (ref 32.0–36.0)
MCV: 90.9 fL (ref 80.0–100.0)
Monocytes Absolute: 0.3 10*3/uL (ref 0.2–0.9)
Monocytes Relative: 9 %
Neutro Abs: 2 10*3/uL (ref 1.4–6.5)
Neutrophils Relative %: 59 %
Platelets: 169 10*3/uL (ref 150–440)
RBC: 4.42 MIL/uL (ref 3.80–5.20)
RDW: 12 % (ref 11.5–14.5)
WBC: 3.5 10*3/uL — AB (ref 3.6–11.0)

## 2014-12-15 LAB — COMPREHENSIVE METABOLIC PANEL
ALT: 22 U/L (ref 14–54)
AST: 26 U/L (ref 15–41)
Albumin: 4.6 g/dL (ref 3.5–5.0)
Alkaline Phosphatase: 59 U/L (ref 38–126)
Anion gap: 5 (ref 5–15)
BUN: 13 mg/dL (ref 6–20)
CO2: 27 mmol/L (ref 22–32)
Calcium: 8.9 mg/dL (ref 8.9–10.3)
Chloride: 103 mmol/L (ref 101–111)
Creatinine, Ser: 0.76 mg/dL (ref 0.44–1.00)
GFR calc non Af Amer: >60 mL/min (ref >60)
Glucose, Bld: 98 mg/dL (ref 65–99)
POTASSIUM: 4 mmol/L (ref 3.5–5.1)
Sodium: 135 mmol/L (ref 135–145)
Total Bilirubin: 0.5 mg/dL (ref 0.3–1.2)
Total Protein: 7.6 g/dL (ref 6.5–8.1)

## 2014-12-15 MED ORDER — SODIUM CHLORIDE 0.9 % IJ SOLN
10.0000 mL | INTRAMUSCULAR | Status: DC | PRN
  Administered 2014-12-15: 10 mL via INTRAVENOUS
  Filled 2014-12-15: qty 10

## 2014-12-15 MED ORDER — HEPARIN SOD (PORK) LOCK FLUSH 100 UNIT/ML IV SOLN
500.0000 [IU] | Freq: Once | INTRAVENOUS | Status: AC
  Administered 2014-12-15: 500 [IU] via INTRAVENOUS

## 2014-12-15 MED ORDER — HEPARIN SOD (PORK) LOCK FLUSH 100 UNIT/ML IV SOLN
INTRAVENOUS | Status: AC
  Filled 2014-12-15: qty 5

## 2014-12-16 ENCOUNTER — Ambulatory Visit
Admission: RE | Admit: 2014-12-16 | Discharge: 2014-12-16 | Disposition: A | Discharge status: Home / Self Care | Payer: BLUE CROSS/BLUE SHIELD | Source: Ambulatory Visit | Attending: Radiation Oncology | Admitting: Radiation Oncology

## 2014-12-16 DIAGNOSIS — C50511 Malignant neoplasm of lower-outer quadrant of right female breast: Secondary | ICD-10-CM | POA: Diagnosis not present

## 2014-12-19 ENCOUNTER — Ambulatory Visit
Admission: RE | Admit: 2014-12-19 | Discharge: 2014-12-19 | Disposition: A | Discharge status: Home / Self Care | Payer: BLUE CROSS/BLUE SHIELD | Source: Ambulatory Visit | Attending: Radiation Oncology | Admitting: Radiation Oncology

## 2014-12-19 DIAGNOSIS — C50511 Malignant neoplasm of lower-outer quadrant of right female breast: Secondary | ICD-10-CM | POA: Diagnosis not present

## 2014-12-20 ENCOUNTER — Ambulatory Visit
Admission: RE | Admit: 2014-12-20 | Discharge: 2014-12-20 | Disposition: A | Discharge status: Home / Self Care | Payer: BLUE CROSS/BLUE SHIELD | Source: Ambulatory Visit | Attending: Radiation Oncology | Admitting: Radiation Oncology

## 2014-12-20 DIAGNOSIS — C50511 Malignant neoplasm of lower-outer quadrant of right female breast: Secondary | ICD-10-CM | POA: Diagnosis not present

## 2014-12-21 ENCOUNTER — Ambulatory Visit: Payer: BLUE CROSS/BLUE SHIELD

## 2014-12-22 ENCOUNTER — Ambulatory Visit: Payer: BLUE CROSS/BLUE SHIELD

## 2014-12-23 ENCOUNTER — Ambulatory Visit: Payer: BLUE CROSS/BLUE SHIELD

## 2014-12-27 ENCOUNTER — Ambulatory Visit
Admission: RE | Admit: 2014-12-27 | Discharge: 2014-12-27 | Disposition: A | Discharge status: Home / Self Care | Payer: BLUE CROSS/BLUE SHIELD | Source: Ambulatory Visit | Attending: Radiation Oncology | Admitting: Radiation Oncology

## 2014-12-27 DIAGNOSIS — C50511 Malignant neoplasm of lower-outer quadrant of right female breast: Secondary | ICD-10-CM | POA: Diagnosis not present

## 2014-12-28 ENCOUNTER — Ambulatory Visit
Admission: RE | Admit: 2014-12-28 | Discharge: 2014-12-28 | Disposition: A | Discharge status: Home / Self Care | Payer: BLUE CROSS/BLUE SHIELD | Source: Ambulatory Visit | Attending: Radiation Oncology | Admitting: Radiation Oncology

## 2014-12-28 DIAGNOSIS — C50511 Malignant neoplasm of lower-outer quadrant of right female breast: Secondary | ICD-10-CM | POA: Diagnosis not present

## 2014-12-29 ENCOUNTER — Ambulatory Visit: Payer: BLUE CROSS/BLUE SHIELD

## 2014-12-29 ENCOUNTER — Inpatient Hospital Stay: Payer: BLUE CROSS/BLUE SHIELD | Attending: Radiation Oncology

## 2014-12-29 DIAGNOSIS — Z171 Estrogen receptor negative status [ER-]: Secondary | ICD-10-CM | POA: Insufficient documentation

## 2014-12-29 DIAGNOSIS — C50511 Malignant neoplasm of lower-outer quadrant of right female breast: Secondary | ICD-10-CM | POA: Insufficient documentation

## 2014-12-30 ENCOUNTER — Ambulatory Visit
Admission: RE | Admit: 2014-12-30 | Discharge: 2014-12-30 | Disposition: A | Discharge status: Home / Self Care | Payer: BLUE CROSS/BLUE SHIELD | Source: Ambulatory Visit | Attending: Radiation Oncology | Admitting: Radiation Oncology

## 2014-12-30 ENCOUNTER — Inpatient Hospital Stay: Payer: BLUE CROSS/BLUE SHIELD

## 2014-12-30 DIAGNOSIS — C50919 Malignant neoplasm of unspecified site of unspecified female breast: Secondary | ICD-10-CM

## 2014-12-30 DIAGNOSIS — Z171 Estrogen receptor negative status [ER-]: Secondary | ICD-10-CM | POA: Diagnosis not present

## 2014-12-30 DIAGNOSIS — C50511 Malignant neoplasm of lower-outer quadrant of right female breast: Secondary | ICD-10-CM | POA: Diagnosis not present

## 2014-12-30 LAB — COMPREHENSIVE METABOLIC PANEL
ALBUMIN: 4.6 g/dL (ref 3.5–5.0)
ALT: 16 U/L (ref 14–54)
ANION GAP: 5 (ref 5–15)
AST: 22 U/L (ref 15–41)
Alkaline Phosphatase: 62 U/L (ref 38–126)
BUN: 13 mg/dL (ref 6–20)
CO2: 30 mmol/L (ref 22–32)
Calcium: 9.7 mg/dL (ref 8.9–10.3)
Chloride: 101 mmol/L (ref 101–111)
Creatinine, Ser: 0.86 mg/dL (ref 0.44–1.00)
GFR calc Af Amer: >60 mL/min (ref >60)
GFR calc non Af Amer: >60 mL/min (ref >60)
Glucose, Bld: 91 mg/dL (ref 65–99)
POTASSIUM: 4.5 mmol/L (ref 3.5–5.1)
Sodium: 136 mmol/L (ref 135–145)
Total Bilirubin: 0.6 mg/dL (ref 0.3–1.2)
Total Protein: 7.8 g/dL (ref 6.5–8.1)

## 2014-12-30 LAB — CBC WITH DIFFERENTIAL/PLATELET
BASOS PCT: 1 %
Basophils Absolute: 0 10*3/uL (ref 0–0.1)
EOS PCT: 5 %
Eosinophils Absolute: 0.1 10*3/uL (ref 0–0.7)
HCT: 42.7 % (ref 35.0–47.0)
Hemoglobin: 14.7 g/dL (ref 12.0–16.0)
Lymphocytes Relative: 24 %
Lymphs Abs: 0.7 10*3/uL — ABNORMAL LOW (ref 1.0–3.6)
MCH: 30.7 pg (ref 26.0–34.0)
MCHC: 34.4 g/dL (ref 32.0–36.0)
MCV: 89.3 fL (ref 80.0–100.0)
MONO ABS: 0.3 10*3/uL (ref 0.2–0.9)
Monocytes Relative: 11 %
Neutro Abs: 1.6 10*3/uL (ref 1.4–6.5)
Neutrophils Relative %: 59 %
Platelets: 154 10*3/uL (ref 150–440)
RBC: 4.78 MIL/uL (ref 3.80–5.20)
RDW: 12.3 % (ref 11.5–14.5)
WBC: 2.7 10*3/uL — ABNORMAL LOW (ref 3.6–11.0)

## 2015-01-02 ENCOUNTER — Ambulatory Visit
Admission: RE | Admit: 2015-01-02 | Discharge: 2015-01-02 | Disposition: A | Discharge status: Home / Self Care | Payer: BLUE CROSS/BLUE SHIELD | Source: Ambulatory Visit | Attending: Radiation Oncology | Admitting: Radiation Oncology

## 2015-01-02 DIAGNOSIS — C50511 Malignant neoplasm of lower-outer quadrant of right female breast: Secondary | ICD-10-CM | POA: Diagnosis not present

## 2015-01-03 ENCOUNTER — Ambulatory Visit
Admission: RE | Admit: 2015-01-03 | Discharge: 2015-01-03 | Disposition: A | Discharge status: Home / Self Care | Payer: BLUE CROSS/BLUE SHIELD | Source: Ambulatory Visit | Attending: Radiation Oncology | Admitting: Radiation Oncology

## 2015-01-03 DIAGNOSIS — C50511 Malignant neoplasm of lower-outer quadrant of right female breast: Secondary | ICD-10-CM | POA: Diagnosis not present

## 2015-01-04 ENCOUNTER — Ambulatory Visit
Admission: RE | Admit: 2015-01-04 | Discharge: 2015-01-04 | Disposition: A | Discharge status: Home / Self Care | Payer: BLUE CROSS/BLUE SHIELD | Source: Ambulatory Visit | Attending: Radiation Oncology | Admitting: Radiation Oncology

## 2015-01-04 DIAGNOSIS — C50511 Malignant neoplasm of lower-outer quadrant of right female breast: Secondary | ICD-10-CM | POA: Diagnosis not present

## 2015-01-05 ENCOUNTER — Ambulatory Visit
Admission: RE | Admit: 2015-01-05 | Discharge: 2015-01-05 | Disposition: A | Discharge status: Home / Self Care | Payer: BLUE CROSS/BLUE SHIELD | Source: Ambulatory Visit | Attending: Radiation Oncology | Admitting: Radiation Oncology

## 2015-01-05 DIAGNOSIS — C50511 Malignant neoplasm of lower-outer quadrant of right female breast: Secondary | ICD-10-CM | POA: Diagnosis not present

## 2015-01-06 ENCOUNTER — Ambulatory Visit
Admission: RE | Admit: 2015-01-06 | Discharge: 2015-01-06 | Disposition: A | Discharge status: Home / Self Care | Payer: BLUE CROSS/BLUE SHIELD | Source: Ambulatory Visit | Attending: Radiation Oncology | Admitting: Radiation Oncology

## 2015-01-06 DIAGNOSIS — C50511 Malignant neoplasm of lower-outer quadrant of right female breast: Secondary | ICD-10-CM | POA: Diagnosis not present

## 2015-01-09 ENCOUNTER — Ambulatory Visit
Admission: RE | Admit: 2015-01-09 | Discharge: 2015-01-09 | Disposition: A | Discharge status: Home / Self Care | Payer: BLUE CROSS/BLUE SHIELD | Source: Ambulatory Visit | Attending: Radiation Oncology | Admitting: Radiation Oncology

## 2015-01-09 DIAGNOSIS — C50511 Malignant neoplasm of lower-outer quadrant of right female breast: Secondary | ICD-10-CM | POA: Diagnosis not present

## 2015-01-10 ENCOUNTER — Ambulatory Visit
Admission: RE | Admit: 2015-01-10 | Discharge: 2015-01-10 | Disposition: A | Discharge status: Home / Self Care | Payer: BLUE CROSS/BLUE SHIELD | Source: Ambulatory Visit | Attending: Radiation Oncology | Admitting: Radiation Oncology

## 2015-01-10 DIAGNOSIS — C50511 Malignant neoplasm of lower-outer quadrant of right female breast: Secondary | ICD-10-CM | POA: Diagnosis not present

## 2015-01-11 ENCOUNTER — Ambulatory Visit
Admission: RE | Admit: 2015-01-11 | Discharge: 2015-01-11 | Disposition: A | Discharge status: Home / Self Care | Payer: BLUE CROSS/BLUE SHIELD | Source: Ambulatory Visit | Attending: Radiation Oncology | Admitting: Radiation Oncology

## 2015-01-11 DIAGNOSIS — C50511 Malignant neoplasm of lower-outer quadrant of right female breast: Secondary | ICD-10-CM | POA: Diagnosis not present

## 2015-01-12 ENCOUNTER — Inpatient Hospital Stay: Payer: BLUE CROSS/BLUE SHIELD

## 2015-01-12 ENCOUNTER — Ambulatory Visit
Admission: RE | Admit: 2015-01-12 | Discharge: 2015-01-12 | Disposition: A | Discharge status: Home / Self Care | Payer: BLUE CROSS/BLUE SHIELD | Source: Ambulatory Visit | Attending: Radiation Oncology | Admitting: Radiation Oncology

## 2015-01-12 DIAGNOSIS — C50511 Malignant neoplasm of lower-outer quadrant of right female breast: Secondary | ICD-10-CM | POA: Diagnosis not present

## 2015-01-12 LAB — COMPREHENSIVE METABOLIC PANEL
ALT: 16 U/L (ref 14–54)
ANION GAP: 4 — AB (ref 5–15)
AST: 20 U/L (ref 15–41)
Albumin: 4.4 g/dL (ref 3.5–5.0)
Alkaline Phosphatase: 59 U/L (ref 38–126)
BUN: 14 mg/dL (ref 6–20)
CHLORIDE: 101 mmol/L (ref 101–111)
CO2: 30 mmol/L (ref 22–32)
CREATININE: 0.86 mg/dL (ref 0.44–1.00)
Calcium: 8.7 mg/dL — ABNORMAL LOW (ref 8.9–10.3)
Glucose, Bld: 98 mg/dL (ref 65–99)
POTASSIUM: 4.2 mmol/L (ref 3.5–5.1)
SODIUM: 135 mmol/L (ref 135–145)
Total Bilirubin: 0.3 mg/dL (ref 0.3–1.2)
Total Protein: 7.6 g/dL (ref 6.5–8.1)

## 2015-01-12 LAB — CBC WITH DIFFERENTIAL/PLATELET
Basophils Absolute: 0 10*3/uL (ref 0–0.1)
Basophils Relative: 1 %
EOS ABS: 0.1 10*3/uL (ref 0–0.7)
EOS PCT: 4 %
HCT: 39.5 % (ref 35.0–47.0)
Hemoglobin: 13.4 g/dL (ref 12.0–16.0)
LYMPHS ABS: 0.6 10*3/uL — AB (ref 1.0–3.6)
LYMPHS PCT: 18 %
MCH: 29.9 pg (ref 26.0–34.0)
MCHC: 33.9 g/dL (ref 32.0–36.0)
MCV: 88.2 fL (ref 80.0–100.0)
MONO ABS: 0.4 10*3/uL (ref 0.2–0.9)
Monocytes Relative: 11 %
Neutro Abs: 2.4 10*3/uL (ref 1.4–6.5)
Neutrophils Relative %: 66 %
Platelets: 176 10*3/uL (ref 150–440)
RBC: 4.48 MIL/uL (ref 3.80–5.20)
RDW: 12.1 % (ref 11.5–14.5)
WBC: 3.5 10*3/uL — ABNORMAL LOW (ref 3.6–11.0)

## 2015-01-13 ENCOUNTER — Ambulatory Visit
Admission: RE | Admit: 2015-01-13 | Discharge: 2015-01-13 | Disposition: A | Discharge status: Home / Self Care | Payer: BLUE CROSS/BLUE SHIELD | Source: Ambulatory Visit | Attending: Radiation Oncology | Admitting: Radiation Oncology

## 2015-01-13 ENCOUNTER — Ambulatory Visit: Payer: BLUE CROSS/BLUE SHIELD

## 2015-01-13 DIAGNOSIS — C50511 Malignant neoplasm of lower-outer quadrant of right female breast: Secondary | ICD-10-CM | POA: Diagnosis not present

## 2015-01-16 ENCOUNTER — Ambulatory Visit
Admission: RE | Admit: 2015-01-16 | Discharge: 2015-01-16 | Disposition: A | Discharge status: Home / Self Care | Payer: BLUE CROSS/BLUE SHIELD | Source: Ambulatory Visit | Attending: Radiation Oncology | Admitting: Radiation Oncology

## 2015-01-16 DIAGNOSIS — C50511 Malignant neoplasm of lower-outer quadrant of right female breast: Secondary | ICD-10-CM | POA: Diagnosis not present

## 2015-01-17 ENCOUNTER — Ambulatory Visit
Admission: RE | Admit: 2015-01-17 | Discharge: 2015-01-17 | Disposition: A | Discharge status: Home / Self Care | Payer: BLUE CROSS/BLUE SHIELD | Source: Ambulatory Visit | Attending: Radiation Oncology | Admitting: Radiation Oncology

## 2015-01-17 DIAGNOSIS — C50511 Malignant neoplasm of lower-outer quadrant of right female breast: Secondary | ICD-10-CM | POA: Diagnosis not present

## 2015-01-18 ENCOUNTER — Ambulatory Visit
Admission: RE | Admit: 2015-01-18 | Discharge: 2015-01-18 | Disposition: A | Discharge status: Home / Self Care | Payer: BLUE CROSS/BLUE SHIELD | Source: Ambulatory Visit | Attending: Radiation Oncology | Admitting: Radiation Oncology

## 2015-01-18 DIAGNOSIS — C50511 Malignant neoplasm of lower-outer quadrant of right female breast: Secondary | ICD-10-CM | POA: Diagnosis not present

## 2015-01-19 ENCOUNTER — Ambulatory Visit
Admission: RE | Admit: 2015-01-19 | Discharge: 2015-01-19 | Disposition: A | Discharge status: Home / Self Care | Payer: BLUE CROSS/BLUE SHIELD | Source: Ambulatory Visit | Attending: Radiation Oncology | Admitting: Radiation Oncology

## 2015-01-19 ENCOUNTER — Ambulatory Visit: Payer: BLUE CROSS/BLUE SHIELD

## 2015-01-20 ENCOUNTER — Ambulatory Visit: Payer: BLUE CROSS/BLUE SHIELD

## 2015-01-23 ENCOUNTER — Ambulatory Visit
Admission: RE | Admit: 2015-01-23 | Discharge: 2015-01-23 | Disposition: A | Discharge status: Home / Self Care | Payer: BLUE CROSS/BLUE SHIELD | Source: Ambulatory Visit | Attending: Radiation Oncology | Admitting: Radiation Oncology

## 2015-01-23 DIAGNOSIS — C50511 Malignant neoplasm of lower-outer quadrant of right female breast: Secondary | ICD-10-CM | POA: Diagnosis not present

## 2015-01-24 ENCOUNTER — Ambulatory Visit
Admission: RE | Admit: 2015-01-24 | Discharge: 2015-01-24 | Disposition: A | Discharge status: Home / Self Care | Payer: BLUE CROSS/BLUE SHIELD | Source: Ambulatory Visit | Attending: Radiation Oncology | Admitting: Radiation Oncology

## 2015-01-24 DIAGNOSIS — C50511 Malignant neoplasm of lower-outer quadrant of right female breast: Secondary | ICD-10-CM | POA: Diagnosis not present

## 2015-01-25 ENCOUNTER — Ambulatory Visit
Admission: RE | Admit: 2015-01-25 | Discharge: 2015-01-25 | Disposition: A | Discharge status: Home / Self Care | Payer: BLUE CROSS/BLUE SHIELD | Source: Ambulatory Visit | Attending: Radiation Oncology | Admitting: Radiation Oncology

## 2015-01-25 DIAGNOSIS — C50511 Malignant neoplasm of lower-outer quadrant of right female breast: Secondary | ICD-10-CM | POA: Diagnosis not present

## 2015-01-26 ENCOUNTER — Ambulatory Visit: Payer: BLUE CROSS/BLUE SHIELD

## 2015-01-26 ENCOUNTER — Ambulatory Visit
Admission: RE | Admit: 2015-01-26 | Discharge: 2015-01-26 | Disposition: A | Discharge status: Home / Self Care | Payer: BLUE CROSS/BLUE SHIELD | Source: Ambulatory Visit | Attending: Radiation Oncology | Admitting: Radiation Oncology

## 2015-01-26 ENCOUNTER — Inpatient Hospital Stay: Payer: BLUE CROSS/BLUE SHIELD | Attending: Oncology

## 2015-01-26 DIAGNOSIS — Z9221 Personal history of antineoplastic chemotherapy: Secondary | ICD-10-CM | POA: Diagnosis not present

## 2015-01-26 DIAGNOSIS — G629 Polyneuropathy, unspecified: Secondary | ICD-10-CM | POA: Diagnosis not present

## 2015-01-26 DIAGNOSIS — Z452 Encounter for adjustment and management of vascular access device: Secondary | ICD-10-CM | POA: Diagnosis not present

## 2015-01-26 DIAGNOSIS — Z79899 Other long term (current) drug therapy: Secondary | ICD-10-CM | POA: Diagnosis not present

## 2015-01-26 DIAGNOSIS — Z23 Encounter for immunization: Secondary | ICD-10-CM | POA: Diagnosis not present

## 2015-01-26 DIAGNOSIS — Z923 Personal history of irradiation: Secondary | ICD-10-CM | POA: Insufficient documentation

## 2015-01-26 DIAGNOSIS — C50511 Malignant neoplasm of lower-outer quadrant of right female breast: Secondary | ICD-10-CM | POA: Diagnosis not present

## 2015-01-26 DIAGNOSIS — Z171 Estrogen receptor negative status [ER-]: Secondary | ICD-10-CM | POA: Diagnosis not present

## 2015-01-26 DIAGNOSIS — C801 Malignant (primary) neoplasm, unspecified: Secondary | ICD-10-CM

## 2015-01-26 MED ORDER — SODIUM CHLORIDE 0.9 % IJ SOLN
10.0000 mL | Freq: Once | INTRAMUSCULAR | Status: AC
Start: 2015-01-26 — End: 2015-01-26
  Administered 2015-01-26: 10 mL via INTRAVENOUS
  Filled 2015-01-26: qty 10

## 2015-01-26 MED ORDER — HEPARIN SOD (PORK) LOCK FLUSH 100 UNIT/ML IV SOLN
INTRAVENOUS | Status: AC
  Filled 2015-01-26: qty 5

## 2015-01-26 MED ORDER — HEPARIN SOD (PORK) LOCK FLUSH 100 UNIT/ML IV SOLN
500.0000 [IU] | Freq: Once | INTRAVENOUS | Status: AC
  Administered 2015-01-26: 500 [IU] via INTRAVENOUS

## 2015-01-27 ENCOUNTER — Ambulatory Visit
Admission: RE | Admit: 2015-01-27 | Discharge: 2015-01-27 | Disposition: A | Discharge status: Home / Self Care | Payer: BLUE CROSS/BLUE SHIELD | Source: Ambulatory Visit | Attending: Radiation Oncology | Admitting: Radiation Oncology

## 2015-01-27 ENCOUNTER — Ambulatory Visit: Payer: BLUE CROSS/BLUE SHIELD

## 2015-01-27 DIAGNOSIS — C50511 Malignant neoplasm of lower-outer quadrant of right female breast: Secondary | ICD-10-CM | POA: Diagnosis not present

## 2015-01-30 ENCOUNTER — Ambulatory Visit: Payer: BLUE CROSS/BLUE SHIELD

## 2015-01-30 ENCOUNTER — Encounter: Payer: Self-pay | Admitting: General Surgery

## 2015-01-30 ENCOUNTER — Ambulatory Visit
Admission: RE | Admit: 2015-01-30 | Discharge: 2015-01-30 | Disposition: A | Discharge status: Home / Self Care | Payer: BLUE CROSS/BLUE SHIELD | Source: Ambulatory Visit | Attending: Radiation Oncology | Admitting: Radiation Oncology

## 2015-01-30 ENCOUNTER — Ambulatory Visit (INDEPENDENT_AMBULATORY_CARE_PROVIDER_SITE_OTHER): Payer: BLUE CROSS/BLUE SHIELD | Admitting: General Surgery

## 2015-01-30 VITALS — BP 116/68 | HR 68 | Resp 12 | Ht 64.0 in | Wt 123.0 lb

## 2015-01-30 DIAGNOSIS — C50511 Malignant neoplasm of lower-outer quadrant of right female breast: Secondary | ICD-10-CM | POA: Insufficient documentation

## 2015-01-30 DIAGNOSIS — C50911 Malignant neoplasm of unspecified site of right female breast: Secondary | ICD-10-CM | POA: Diagnosis not present

## 2015-01-30 DIAGNOSIS — Z1239 Encounter for other screening for malignant neoplasm of breast: Secondary | ICD-10-CM

## 2015-01-30 DIAGNOSIS — Z171 Estrogen receptor negative status [ER-]: Secondary | ICD-10-CM

## 2015-01-30 NOTE — Progress Notes (Signed)
Patient ID: Kiara Grant, female   DOB: 01/02/1971, 44 y.o.   MRN: [id number]  Chief Complaint  Patient presents with  . Follow-up    Breast cancer    HPI Kiara Grant is a 44 y.o. female here today for breast cancer follow up. Patient states she is doing well. She states she has two  treatments left.  HPI  Past Medical History  Diagnosis Date  . Cancer (HCC) 03-24-14    Right breast, microcalcifications. ER negative, PR less than 10%, HER-2/neu not amplified.  . Melanoma (HCC) Sept 2011    Left neck T1a, 0.35 mm. Treated by Mohs injury [facility].  . Breast cancer (HCC) 03/2014    Right, T1b, N1 prior to neo-adjuvant chemotherapy. ER negative, PR less than 10%, HER-2/neu not overexpressed.  . GERD (gastroesophageal reflux disease)   . PONV (postoperative nausea and vomiting)     Past Surgical History  Procedure Laterality Date  . Breast biopsy Right 03-24-14  . Melanoma excision  Nov 2011    neck  . Portacath placement  Dec 2015  . Breast lumpectomy with sentinel lymph node biopsy Right 10/28/2014    Procedure: right breast wide excision with sentinel node biopsy, mastoplasty, axillary dissection ;  Surgeon: Jeffrey W Byrnett, MD;  Location: [facility] ORS;  Service: General;  Laterality: Right;    Family History  Problem Relation Age of Onset  . Cancer Mother     lung ? mid 50    Social History Social History  Substance Use Topics  . Smoking status: Never Smoker   . Smokeless tobacco: Never Used  . Alcohol Use: 0.0 oz/week    0 Standard drinks or equivalent per week     Comment: 3/week    Allergies  Allergen Reactions  . Sulfa Antibiotics Nausea Only    Current Outpatient Prescriptions  Medication Sig Dispense Refill  . doxycycline (VIBRA-TABS) 100 MG tablet Take 1 tablet (100 mg total) by mouth 2 (two) times daily. 14 tablet 0  . loratadine-pseudoephedrine (CLARITIN-D 24-HOUR) 10-240 MG per 24 hr tablet Take 1 tablet by mouth as needed.     . omeprazole  (PRILOSEC) 20 MG capsule Take 1 capsule (20 mg total) by mouth 2 (two) times daily before a meal. 60 capsule 3  . ondansetron (ZOFRAN) 4 MG tablet Take 4 mg by mouth every 6 (six) hours as needed for nausea or vomiting.    . promethazine (PHENERGAN) 25 MG tablet Take 1 tablet (25 mg total) by mouth every 6 (six) hours as needed for nausea or vomiting. 12 tablet 0  . senna (SENOKOT) 8.6 MG TABS tablet Take 1 tablet by mouth daily as needed for mild constipation.     No current facility-administered medications for this visit.   Facility-Administered Medications Ordered in Other Visits  Medication Dose Route Frequency Provider Last Rate Last Dose  . sodium chloride 0.9 % injection 10 mL  10 mL Intracatheter PRN Janak Choksi, MD   10 mL at 08/23/14 1430  . sodium chloride 0.9 % injection 10 mL  10 mL Intracatheter PRN Janak Choksi, MD   10 mL at 09/06/14 1400  . sodium chloride 0.9 % injection 10 mL  10 mL Intracatheter PRN Janak Choksi, MD   10 mL at 09/13/14 1430  . sodium chloride 0.9 % injection 10 mL  10 mL Intravenous PRN Leslie F Herring, NP   10 mL at 11/08/14 1207    Review of Systems Review of Systems  Constitutional:   Negative.   Respiratory: Negative.   Cardiovascular: Negative.     Blood pressure 116/68, pulse 68, resp. rate 12, height 5' 4" (1.626 m), weight 123 lb (55.792 kg).  Physical Exam Physical Exam  Constitutional: She is oriented to person, place, and time. She appears well-developed and well-nourished.  Eyes: Conjunctivae are normal. No scleral icterus.  Cardiovascular: Normal rate, regular rhythm and normal heart sounds.   Pulmonary/Chest: Effort normal and breath sounds normal. Right breast exhibits skin change (Skin discoloration due to radiation). Right breast exhibits no inverted nipple, no mass, no nipple discharge and no tenderness. Left breast exhibits no inverted nipple, no mass, no nipple discharge, no skin change and no tenderness.    Right breast  insicion at 9 o'clock minimal thickening  Lymphadenopathy:    She has no cervical adenopathy.    She has no axillary adenopathy.       Right: No supraclavicular adenopathy present.       Left: No supraclavicular adenopathy present.  Neurological: She is alert and oriented to person, place, and time.  Skin: Skin is warm and dry.    Data Reviewed Medical oncology notes.  Assessment    Doing well status post breast conservation after neo-adjuvant chemotherapy. Near the end of her whole breast radiation.    Plan         Call with any questions and concerns. Follow up in December 2016 with screening Left beast mammogram. This has been scheduled for 03-27-15 at 8 am [facility]). June 2017 Bilateral Diagnostic mammogram.  Reccommended screening colonoscopy. Women with breast cancer at a slightly higher risk of colon cancer. Screening is reasonable to complete early based on her early presentation with breast cancer. This can be scheduled either with the GI department or through this office.  Colonoscopy with possible biopsy/polypectomy prn: Information regarding the procedure, including its potential risks and complications (including but not limited to perforation of the bowel, which may require emergency surgery to repair, and bleeding) was verbally given to the patient. Educational information regarding lower intestinal endoscopy was given to the patient. Written instructions for how to complete the bowel prep using Miralax were provided. The importance of drinking ample fluids to avoid dehydration as a result of the prep emphasized.  The patient is scheduled to meet with Dr. Oliva Bustard later this month. Should he give permission we can remove her port at any time.   PCP: Anice Paganini 01/30/2015, 4:42 PM

## 2015-01-30 NOTE — Patient Instructions (Signed)
Call with any questions and concerns. Follow up in December with screening Left beast mammogram.

## 2015-01-31 ENCOUNTER — Ambulatory Visit: Payer: BLUE CROSS/BLUE SHIELD

## 2015-01-31 ENCOUNTER — Ambulatory Visit
Admission: RE | Admit: 2015-01-31 | Discharge: 2015-01-31 | Disposition: A | Discharge status: Home / Self Care | Payer: BLUE CROSS/BLUE SHIELD | Source: Ambulatory Visit | Attending: Radiation Oncology | Admitting: Radiation Oncology

## 2015-01-31 DIAGNOSIS — C50511 Malignant neoplasm of lower-outer quadrant of right female breast: Secondary | ICD-10-CM | POA: Diagnosis not present

## 2015-02-01 ENCOUNTER — Ambulatory Visit
Admission: RE | Admit: 2015-02-01 | Discharge: 2015-02-01 | Disposition: A | Discharge status: Home / Self Care | Payer: BLUE CROSS/BLUE SHIELD | Source: Ambulatory Visit | Attending: Radiation Oncology | Admitting: Radiation Oncology

## 2015-02-01 ENCOUNTER — Ambulatory Visit: Payer: BLUE CROSS/BLUE SHIELD

## 2015-02-01 DIAGNOSIS — C50511 Malignant neoplasm of lower-outer quadrant of right female breast: Secondary | ICD-10-CM | POA: Diagnosis not present

## 2015-02-15 ENCOUNTER — Ambulatory Visit: Payer: BLUE CROSS/BLUE SHIELD | Admitting: Oncology

## 2015-02-16 ENCOUNTER — Encounter: Payer: Self-pay | Admitting: Oncology

## 2015-02-16 ENCOUNTER — Inpatient Hospital Stay (HOSPITAL_BASED_OUTPATIENT_CLINIC_OR_DEPARTMENT_OTHER): Payer: BLUE CROSS/BLUE SHIELD | Admitting: Oncology

## 2015-02-16 VITALS — BP 104/74 | HR 85 | Temp 95.8°F | Wt 123.9 lb

## 2015-02-16 DIAGNOSIS — Z923 Personal history of irradiation: Secondary | ICD-10-CM | POA: Diagnosis not present

## 2015-02-16 DIAGNOSIS — C50919 Malignant neoplasm of unspecified site of unspecified female breast: Secondary | ICD-10-CM

## 2015-02-16 DIAGNOSIS — Z171 Estrogen receptor negative status [ER-]: Secondary | ICD-10-CM

## 2015-02-16 DIAGNOSIS — G629 Polyneuropathy, unspecified: Secondary | ICD-10-CM

## 2015-02-16 DIAGNOSIS — C50511 Malignant neoplasm of lower-outer quadrant of right female breast: Secondary | ICD-10-CM | POA: Diagnosis not present

## 2015-02-16 DIAGNOSIS — Z79899 Other long term (current) drug therapy: Secondary | ICD-10-CM

## 2015-02-16 DIAGNOSIS — Z9221 Personal history of antineoplastic chemotherapy: Secondary | ICD-10-CM

## 2015-02-16 MED ORDER — INFLUENZA VAC SPLIT QUAD 0.5 ML IM SUSY
0.5000 mL | PREFILLED_SYRINGE | Freq: Once | INTRAMUSCULAR | Status: AC
  Administered 2015-02-16: 0.5 mL via INTRAMUSCULAR
  Filled 2015-02-16: qty 0.5

## 2015-02-16 NOTE — Progress Notes (Signed)
[name] @ [facility] [phone]  Fax:([phone]     Kiara Grant OB: Apr 25, 1970  MR#: [id number]  [id number]#:[id number]  Patient Care Team: Chad Cordial, PA-C as PCP - General (Physician Assistant) Robert Bellow, MD (General Surgery) Chad Cordial, PA-C as Referring Physician (Physician Assistant) [name], MD (Oncology)  CHIEF COMPLAINT:  Chief Complaint  Patient presents with  . OTHER   Oncology History   1. Carcinoma of right breast T1 be N1 M0 tumor stage II diagnosis in December of 2015 (right lower and outer quadrant). Needle biopsy positive of the right, Axillary lymph node (December, 2015), positive for metastatic breast carcinoma. Estrogen receptor negative.  Progesterone receptor weakly positive.  HER-2/neu receptor negative. 2. Chemotherapy with Cytoxan, Adriamycin,  followed by Taxol (neoadjuvant therapy) April 25, 2014., 3.finished 4 cycles of chemotherapy with Cytoxan and Adriamycin on 7 th  March, 2016 4.started Taxol on a weekly basis from March 29 5.  Patient will finish weekly Taxol on 13th of June, 2016 6.  Patient had lumpectomy and axillary node evaluation. pT1b  pN1 cMO tumor was found.  (July, 2016) 7.  Genetic mutation.  No clinically significant mutation identified (December, 2015)     Patient is finished radiation therapy(September of 2016)   Oncology Flowsheet 08/30/2014 09/06/2014 09/13/2014 09/20/2014 09/27/2014 10/04/2014 10/28/2014  Day, Cycle Day 15, Cycle 2 Day 22, Cycle 2 Day 1, Cycle 3 Day 8, Cycle 3 Day 15, Cycle 3 Day 22, Cycle 3 -  dexamethasone (DECADRON) IJ - - - - - - -  dexamethasone (DECADRON) IV [ 20 mg ] [ 20 mg ] [ 20 mg ] [ 20 mg ] [ 20 mg ] [ 20 mg ] -  ondansetron (ZOFRAN) IV [ 8 mg ] [ 8 mg ] [ 8 mg ] [ 8 mg ] [ 8 mg ] [ 8 mg ]    PACLitaxel (TAXOL) IV 80 mg/m2 80 mg/m2 80 mg/m2 80 mg/m2 80 mg/m2 80 mg/m2 -    INTERVAL HISTORY: 44 year old lady with stage II carcinoma of breast on adjuvant chemotherapy.    She has finished up radiation therapy. Patient is here for further follow-up. 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 Patient also desires flu vaccine.     Gen. status: Patient is in good performance status.  No chills or fever Performance status is 0 HEENT: No soreness in the mouth no difficulty swallowing Lungs: No cough or shortness of breath Cardiac: No chest pain.  No shortness of breath.  No paroxysmal nocturnal dyspnea. GI: Normal.  No nausea.  No vomiting.  No diarrhea.  No abdominal pain Lower extremity no edema Skin: No rash Neurological system: No headache no dizziness.  No other focal symptoms.  Intermittent tingling numbness in upper and lower extremity GU: No dysuria or hematuria  As per HPI. Otherwise, a complete review of systems is negatve.  PAST MEDICAL HISTORY: Past Medical History  Diagnosis Date  . Cancer [facility]) 03-24-14    Right breast, microcalcifications. ER negative, PR less than 10%, HER-2/neu not amplified.  . Melanoma [facility]) Sept 2011    Left neck T1a, 0.35 mm. Treated by Mohs injury [facility].  . Breast cancer ([place]) 03/2014    Right, T1b, N1 prior to neo-adjuvant chemotherapy. ER negative, PR less than 10%, HER-2/neu not overexpressed.  Marland Kitchen GERD (gastroesophageal reflux disease)   . PONV (postoperative nausea and vomiting)     PAST SURGICAL HISTORY: Past Surgical History  Procedure Laterality Date  . Breast biopsy Right 03-24-14  . Melanoma excision  Nov 2011    neck  . Portacath placement  Dec 2015  . Breast lumpectomy with sentinel lymph node biopsy Right 10/28/2014    Procedure: right breast wide excision with sentinel node biopsy, mastoplasty, axillary dissection ;  Surgeon: Robert Bellow, MD;  Location: [facility] ORS;  Service: General;  Laterality: Right;    FAMILY HISTORY Family History  Problem Relation Age of Onset  . Cancer Mother     lung ? mid 50    Significant History/PMH:    Melanoma:    Breast Cancer:    Melanoma excision:    Breast Biopsy:   Preventive Screening:  Has patient had any of the following test? Colonscopy  Mammography  Pap Smear (1)   Last Colonoscopy: Never(1)   Last Mammography: 2015(1)   Last Pap Smear: 2015(1)   Smoking History: Smoking History Never Smoked.(1)  PFSH: Family History: noncontributory  Comments: not examinedo family history of breast cancer  Social History: negative alcohol, negative tobacco       ADVANCED DIRECTIVES:  Patient  does not have any advance care directive  HEALTH MAINTENANCE: Social History  Substance Use Topics  . Smoking status: Never Smoker   . Smokeless tobacco: Never Used  . Alcohol Use: 0.0 oz/week    0 Standard drinks or equivalent per week     Comment: 3/week       Allergies  Allergen Reactions  . Sulfa Antibiotics Nausea Only    Current Outpatient Prescriptions  Medication Sig Dispense Refill  . Biotin 10 MG CAPS Take by mouth.    . senna (SENOKOT) 8.6 MG TABS tablet Take 1 tablet by mouth daily as needed for mild constipation.     No current facility-administered medications for this visit.   Facility-Administered Medications Ordered in Other Visits  Medication Dose Route Frequency Provider Last Rate Last Dose  . sodium chloride 0.9 % injection 10 mL  10 mL Intracatheter PRN [name], MD   10 mL at 08/23/14 1430  . sodium chloride 0.9 % injection 10 mL  10 mL Intracatheter PRN [name], MD   10 mL at 09/06/14 1400  . sodium chloride 0.9 % injection 10 mL  10 mL Intracatheter PRN [name], MD   10 mL at 09/13/14 1430  . sodium chloride 0.9 % injection 10 mL  10 mL Intravenous PRN Evlyn Kanner, NP   10 mL at 11/08/14 1207    OBJECTIVE:  Filed Vitals:   02/16/15 1003  BP: 104/74  Pulse: 85  Temp: 95.8 F (35.4 C)     Body mass index is 21.26 kg/(m^2).    ECOG FS:0 - Asymptomatic  PHYSICAL EXAM: Goal status: Performance status is good.  Patient  has not lost significant weight HEENT: No evidence of stomatitis.  And alopecia Sclera and conjunctivae :: No jaundice.   pale looking . Lungs: Air  entry equal on both sides.  No rhonchi.  No rales.  Cardiac: Heart sounds are normal.  No pericardial rub.  No murmur. Lymphatic system: Cervical, axillary, inguinal, lymph nodes not palpable GI: Abdomen is soft.  No ascites.  Liver spleen not palpable.  No tenderness.  Bowel sounds are within normal limit Lower extremity: No edema Neurological system: Higher functions, cranial nerves intact No evidence of peripheral neuropathy. Skin: No rash.  No ecchymosis..  Left breast free of masses.  Right breast: Lumpectomy wound is healing postradiation changes  LAB RESULTS:  No visits with results within 3 Day(s) from this visit. Latest known visit with results is:  Appointment on 01/12/2015  Component Date Value Ref Range Status  . WBC 01/12/2015 3.5* 3.6 - 11.0 K/uL Final  . RBC 01/12/2015 4.48  3.80 - 5.20 MIL/uL Final  . Hemoglobin 01/12/2015 13.4  12.0 - 16.0 g/dL Final  . HCT 01/12/2015 39.5  35.0 - 47.0 % Final  . MCV 01/12/2015 88.2  80.0 - 100.0 fL Final  . MCH 01/12/2015 29.9  26.0 - 34.0 pg Final  . MCHC 01/12/2015 33.9  32.0 - 36.0 g/dL Final  . RDW 01/12/2015 12.1  11.5 - 14.5 % Final  . Platelets 01/12/2015 176  150 - 440 K/uL Final  . Neutrophils Relative % 01/12/2015 66   Final  . Neutro Abs 01/12/2015 2.4  1.4 - 6.5 K/uL Final  . Lymphocytes Relative 01/12/2015 18   Final  . Lymphs Abs 01/12/2015 0.6* 1.0 - 3.6 K/uL Final  . Monocytes Relative 01/12/2015 11   Final  . Monocytes Absolute 01/12/2015 0.4  0.2 - 0.9 K/uL Final  . Eosinophils Relative 01/12/2015 4   Final  . Eosinophils Absolute 01/12/2015 0.1  0 - 0.7 K/uL Final  . Basophils Relative 01/12/2015 1   Final  . Basophils Absolute 01/12/2015 0.0  0 - 0.1 K/uL Final  . Sodium 01/12/2015 135  135 - 145 mmol/L Final  . Potassium 01/12/2015 4.2  3.5 - 5.1 mmol/L  Final  . Chloride 01/12/2015 101  101 - 111 mmol/L Final  . CO2 01/12/2015 30  22 - 32 mmol/L Final  . Glucose, Bld 01/12/2015 98  65 - 99 mg/dL Final  . BUN 01/12/2015 14  6 - 20 mg/dL Final  . Creatinine, Ser 01/12/2015 0.86  0.44 - 1.00 mg/dL Final  . Calcium 01/12/2015 8.7* 8.9 - 10.3 mg/dL Final  . Total Protein 01/12/2015 7.6  6.5 - 8.1 g/dL Final  . Albumin 01/12/2015 4.4  3.5 - 5.0 g/dL Final  . AST 01/12/2015 20  15 - 41 U/L Final  . ALT 01/12/2015 16  14 - 54 U/L Final  . Alkaline Phosphatase 01/12/2015 59  38 - 126 U/L Final  . Total Bilirubin 01/12/2015 0.3  0.3 - 1.2 mg/dL Final  . GFR calc non Af Amer 01/12/2015 >60  >60 mL/min Final  . GFR calc Af Amer 01/12/2015 >60  >60 mL/min Final   Comment: (NOTE) The eGFR has been calculated using the CKD EPI equation. This calculation has not been validated in all clinical situations. eGFR's persistently <60 mL/min signify possible Chronic Kidney Disease.   . Anion gap 01/12/2015 4* 5 - 15 Final    Lab Results  Component Value Date   LABCA2 10.1 04/25/2014   No results found for: CA199 Lab Results  Component Value Date   CEA 1.6 03/30/2014     ASSESSMENT: 44 year old lady with states to carcinoma breast here for continuation of Taxol chemotherapy Patient had a recent lumpectomy and lymph node evaluation Postsurgical stage II disease Right breast lumpectomy and radiation therapy there is no evidence of recurrent diseaseall lab data has been reviewed Another mammogram is been scheduled of the right breast in December and bilateral mammogram in June On clinical examination there is no evidence of recurrent disease Patient desires flu vaccine which will be given Patient will continue to report to [place] about neuropathy feet gets worse or not getting better Neurontin would be advised The patient was advised  to get physiotherapy if continues to have functional issues with upper extremity Reevaluation in 6 month Consider  possibility of removing port in next year or so if there is no evidence of recurrent or progressive disease    Patient expressed understanding and was in agreement with this plan. She also understands that She can call clinic at any time with any questions, concerns, or complaints.    Breast cancer   Staging form: Breast, AJCC 7th Edition     Clinical: Stage IIA (T1b, N1, M0) - Signed by [name], MD on 08/01/2014   [name], MD   02/16/2015 10:30 AM

## 2015-03-06 ENCOUNTER — Ambulatory Visit
Admission: RE | Admit: 2015-03-06 | Discharge: 2015-03-06 | Disposition: A | Discharge status: Home / Self Care | Payer: BLUE CROSS/BLUE SHIELD | Source: Ambulatory Visit | Attending: Radiation Oncology | Admitting: Radiation Oncology

## 2015-03-06 ENCOUNTER — Encounter: Payer: Self-pay | Admitting: Radiation Oncology

## 2015-03-06 VITALS — BP 114/65 | HR 87 | Temp 96.0°F | Resp 18 | Wt 126.1 lb

## 2015-03-06 DIAGNOSIS — C50911 Malignant neoplasm of unspecified site of right female breast: Secondary | ICD-10-CM

## 2015-03-06 NOTE — Progress Notes (Signed)
Radiation Oncology Follow up Note  Name: Kiara Grant   Date:   03/06/2015 MRN:  [id number] DOB: Aug 26, 1970    This 44 y.o. 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  REFERRING PROVIDER: Copland, Deirdre Evener, PA-C  HPI: Patient is a 44 year old female now 1 month out having completed whole breast radiation to her right breast for a T1 BN 1 M0 invasive mammary carcinoma triple negative disease status post wide local excision and sentinel node biopsy after neoadjuvant chemotherapy. She is seen today in routine follow-up and is doing well. She specifically denies breast tenderness cough or bone pain.. She still has some mild peripheral neuropathy from her Taxol chemotherapy.  COMPLICATIONS OF TREATMENT: none  FOLLOW UP COMPLIANCE: keeps appointments   PHYSICAL EXAM:  BP 114/65 mmHg  Pulse 87  Temp(Src) 96 F (35.6 C)  Resp 18  Wt 126 lb 1.7 oz (57.2 kg) Lungs are clear to A&P cardiac examination essentially unremarkable with regular rate and rhythm. No dominant mass or nodularity is noted in either breast in 2 positions examined. Incision is well-healed. No axillary or supraclavicular adenopathy is appreciated. Cosmetic result is excellent. Well-developed well-nourished patient in NAD. HEENT reveals PERLA, EOMI, discs not visualized.  Oral cavity is clear. No oral mucosal lesions are identified. Neck is clear without evidence of cervical or supraclavicular adenopathy. Lungs are clear to A&P. Cardiac examination is essentially unremarkable with regular rate and rhythm without murmur rub or thrill. Abdomen is benign with no organomegaly or masses noted. Motor sensory and DTR levels are equal and symmetric in the upper and lower extremities. Cranial nerves II through XII are grossly intact. Proprioception is intact. No peripheral adenopathy or edema is identified. No motor or sensory  levels are noted. Crude visual fields are within normal range.  RADIOLOGY RESULTS: No current films for review  PLAN: Present time she is doing well now 1 out 1 month from radiation therapy. I'm please were overall progress. I've asked to see her back in 4-5 months for follow-up. She continues close follow-up care with medical oncology and surgeon. Patient is to call with any concerns.  I would like to take this opportunity for allowing me to participate in the care of your patient.Armstead Peaks., MD

## 2015-03-09 ENCOUNTER — Inpatient Hospital Stay: Payer: BLUE CROSS/BLUE SHIELD | Attending: Oncology

## 2015-03-09 VITALS — BP 99/56 | HR 84 | Temp 97.1°F | Resp 18

## 2015-03-09 DIAGNOSIS — C50511 Malignant neoplasm of lower-outer quadrant of right female breast: Secondary | ICD-10-CM | POA: Diagnosis not present

## 2015-03-09 DIAGNOSIS — Z171 Estrogen receptor negative status [ER-]: Secondary | ICD-10-CM | POA: Diagnosis not present

## 2015-03-09 DIAGNOSIS — Z95828 Presence of other vascular implants and grafts: Secondary | ICD-10-CM

## 2015-03-09 DIAGNOSIS — Z452 Encounter for adjustment and management of vascular access device: Secondary | ICD-10-CM | POA: Insufficient documentation

## 2015-03-09 MED ORDER — HEPARIN SOD (PORK) LOCK FLUSH 100 UNIT/ML IV SOLN
500.0000 [IU] | Freq: Once | INTRAVENOUS | Status: AC
  Administered 2015-03-09: 500 [IU] via INTRAVENOUS
  Filled 2015-03-09: qty 5

## 2015-03-09 MED ORDER — SODIUM CHLORIDE 0.9 % IJ SOLN
10.0000 mL | INTRAMUSCULAR | Status: DC | PRN
Start: 2015-03-09 — End: 2015-03-09
  Administered 2015-03-09: 10 mL via INTRAVENOUS
  Filled 2015-03-09: qty 10

## 2015-03-15 ENCOUNTER — Telehealth: Payer: Self-pay | Admitting: *Deleted

## 2015-03-15 MED ORDER — POLYETHYLENE GLYCOL 3350 17 GM/SCOOP PO POWD
ORAL | Status: DC
Start: 2015-03-15 — End: 2015-08-17

## 2015-03-15 NOTE — Telephone Encounter (Signed)
Patient has been scheduled for a colonoscopy on 03-28-15 at [facility].  Miralax prescirption has been sent in to the patient's pharmacy today.   Colonoscopy instructions were mailed to the patient today.

## 2015-03-27 ENCOUNTER — Ambulatory Visit
Admission: RE | Admit: 2015-03-27 | Discharge: 2015-03-27 | Disposition: A | Discharge status: Home / Self Care | Payer: BLUE CROSS/BLUE SHIELD | Source: Ambulatory Visit | Attending: General Surgery | Admitting: General Surgery

## 2015-03-27 DIAGNOSIS — C50911 Malignant neoplasm of unspecified site of right female breast: Secondary | ICD-10-CM | POA: Insufficient documentation

## 2015-03-27 DIAGNOSIS — Z1239 Encounter for other screening for malignant neoplasm of breast: Secondary | ICD-10-CM

## 2015-03-28 DIAGNOSIS — Z1211 Encounter for screening for malignant neoplasm of colon: Secondary | ICD-10-CM | POA: Diagnosis not present

## 2015-04-03 ENCOUNTER — Encounter: Payer: Self-pay | Admitting: General Surgery

## 2015-04-23 HISTORY — PX: PORT-A-CATH REMOVAL: SHX5289

## 2015-05-12 ENCOUNTER — Inpatient Hospital Stay: Payer: BLUE CROSS/BLUE SHIELD | Attending: Oncology

## 2015-05-12 VITALS — Resp 18

## 2015-05-12 DIAGNOSIS — Z171 Estrogen receptor negative status [ER-]: Secondary | ICD-10-CM | POA: Diagnosis not present

## 2015-05-12 DIAGNOSIS — Z452 Encounter for adjustment and management of vascular access device: Secondary | ICD-10-CM | POA: Diagnosis not present

## 2015-05-12 DIAGNOSIS — Z95828 Presence of other vascular implants and grafts: Secondary | ICD-10-CM

## 2015-05-12 DIAGNOSIS — C50511 Malignant neoplasm of lower-outer quadrant of right female breast: Secondary | ICD-10-CM | POA: Diagnosis not present

## 2015-05-12 MED ORDER — SODIUM CHLORIDE 0.9 % IJ SOLN
10.0000 mL | INTRAMUSCULAR | Status: DC | PRN
  Administered 2015-05-12: 10 mL via INTRAVENOUS
  Filled 2015-05-12: qty 10

## 2015-05-12 MED ORDER — HEPARIN SOD (PORK) LOCK FLUSH 100 UNIT/ML IV SOLN
500.0000 [IU] | Freq: Once | INTRAVENOUS | Status: AC
  Administered 2015-05-12: 500 [IU] via INTRAVENOUS
  Filled 2015-05-12: qty 5

## 2015-07-20 ENCOUNTER — Other Ambulatory Visit: Payer: Self-pay | Admitting: *Deleted

## 2015-07-20 DIAGNOSIS — C50911 Malignant neoplasm of unspecified site of right female breast: Secondary | ICD-10-CM

## 2015-07-25 ENCOUNTER — Encounter: Payer: Self-pay | Admitting: *Deleted

## 2015-08-10 ENCOUNTER — Ambulatory Visit
Admission: RE | Admit: 2015-08-10 | Discharge: 2015-08-10 | Disposition: A | Discharge status: Home / Self Care | Payer: BLUE CROSS/BLUE SHIELD | Source: Ambulatory Visit | Attending: Radiation Oncology | Admitting: Radiation Oncology

## 2015-08-10 ENCOUNTER — Encounter: Payer: Self-pay | Admitting: Radiation Oncology

## 2015-08-10 VITALS — BP 101/65 | HR 75 | Temp 97.5°F | Resp 20 | Wt 122.7 lb

## 2015-08-10 DIAGNOSIS — C50911 Malignant neoplasm of unspecified site of right female breast: Secondary | ICD-10-CM

## 2015-08-10 NOTE — Progress Notes (Signed)
Radiation Oncology Follow up Note  Name: Kiara Grant   Date:   08/10/2015 MRN:  [id number] DOB: March 17, 1971    This 45 y.o. female presents to the clinic today for follow-up for stage II triple negative breast cancer status post neoadjuvant chemotherapy now out 6 months from whole breast radiation to her right breast.  REFERRING PROVIDER: [name], MD  HPI: Patient is a 45 year old female now out 6 months having completed whole breast radiation therapy for a T1 be N1 M0 invasive mammary carcinoma the right breast triple negative disease status post wide local excision and sentinel node biopsy. She had neoadjuvant chemotherapy. Seen today in routine follow-up she is doing well. She specifically denies breast tenderness cough or bone pain. She is not on aromatase inhibitor based on the triple negative nature of her disease..  COMPLICATIONS OF TREATMENT: none  FOLLOW UP COMPLIANCE: keeps appointments   PHYSICAL EXAM:  BP 101/65 mmHg  Pulse 75  Temp(Src) 97.5 F (36.4 C)  Resp 20  Wt 122 lb 11 oz (55.65 kg) Lungs are clear to A&P cardiac examination essentially unremarkable with regular rate and rhythm. No dominant mass or nodularity is noted in either breast in 2 positions examined. Incision is well-healed. No axillary or supraclavicular adenopathy is appreciated. Cosmetic result is excellent. Does have a port placed in the left anterior chest wall. Well-developed well-nourished patient in NAD. HEENT reveals PERLA, EOMI, discs not visualized.  Oral cavity is clear. No oral mucosal lesions are identified. Neck is clear without evidence of cervical or supraclavicular adenopathy. Lungs are clear to A&P. Cardiac examination is essentially unremarkable with regular rate and rhythm without murmur rub or thrill. Abdomen is benign with no organomegaly or masses noted. Motor sensory and DTR levels are equal and symmetric in the upper and lower extremities. Cranial nerves II through XII are  grossly intact. Proprioception is intact. No peripheral adenopathy or edema is identified. No motor or sensory levels are noted. Crude visual fields are within normal range.  RADIOLOGY RESULTS: Mammograms the left breast reviewed she scheduled for follow-up bilateral mammograms in the next several months.  PLAN: Present time she is doing well with no evidence of disease. I am please were overall progress. I've asked to see her back in 6 months for follow-up and will go once your follow-up appointments. She currently continues close follow-up care with medical oncology.  I would like to take this opportunity for allowing me to participate in the care of your patient.Armstead Peaks., MD

## 2015-08-17 ENCOUNTER — Inpatient Hospital Stay (HOSPITAL_BASED_OUTPATIENT_CLINIC_OR_DEPARTMENT_OTHER): Payer: BLUE CROSS/BLUE SHIELD | Admitting: Oncology

## 2015-08-17 ENCOUNTER — Inpatient Hospital Stay: Payer: BLUE CROSS/BLUE SHIELD | Attending: Oncology

## 2015-08-17 ENCOUNTER — Encounter: Payer: Self-pay | Admitting: Oncology

## 2015-08-17 ENCOUNTER — Inpatient Hospital Stay: Payer: BLUE CROSS/BLUE SHIELD

## 2015-08-17 VITALS — BP 120/74 | HR 81 | Temp 96.8°F | Resp 18 | Wt 124.8 lb

## 2015-08-17 DIAGNOSIS — C50511 Malignant neoplasm of lower-outer quadrant of right female breast: Secondary | ICD-10-CM | POA: Insufficient documentation

## 2015-08-17 DIAGNOSIS — Z79899 Other long term (current) drug therapy: Secondary | ICD-10-CM | POA: Diagnosis not present

## 2015-08-17 DIAGNOSIS — Z85820 Personal history of malignant melanoma of skin: Secondary | ICD-10-CM | POA: Insufficient documentation

## 2015-08-17 DIAGNOSIS — C50919 Malignant neoplasm of unspecified site of unspecified female breast: Secondary | ICD-10-CM

## 2015-08-17 DIAGNOSIS — Z9221 Personal history of antineoplastic chemotherapy: Secondary | ICD-10-CM

## 2015-08-17 DIAGNOSIS — K219 Gastro-esophageal reflux disease without esophagitis: Secondary | ICD-10-CM | POA: Insufficient documentation

## 2015-08-17 DIAGNOSIS — Z171 Estrogen receptor negative status [ER-]: Secondary | ICD-10-CM

## 2015-08-17 DIAGNOSIS — Z95828 Presence of other vascular implants and grafts: Secondary | ICD-10-CM

## 2015-08-17 DIAGNOSIS — Z923 Personal history of irradiation: Secondary | ICD-10-CM | POA: Diagnosis not present

## 2015-08-17 DIAGNOSIS — C50911 Malignant neoplasm of unspecified site of right female breast: Secondary | ICD-10-CM

## 2015-08-17 LAB — CBC WITH DIFFERENTIAL/PLATELET
BASOS ABS: 0 10*3/uL (ref 0–0.1)
Basophils Relative: 1 %
Eosinophils Absolute: 0.1 10*3/uL (ref 0–0.7)
Eosinophils Relative: 2 %
HEMATOCRIT: 37.1 % (ref 35.0–47.0)
HEMOGLOBIN: 13 g/dL (ref 12.0–16.0)
LYMPHS PCT: 23 %
Lymphs Abs: 1.2 10*3/uL (ref 1.0–3.6)
MCH: 31.1 pg (ref 26.0–34.0)
MCHC: 35.1 g/dL (ref 32.0–36.0)
MCV: 88.7 fL (ref 80.0–100.0)
MONO ABS: 0.4 10*3/uL (ref 0.2–0.9)
Monocytes Relative: 8 %
NEUTROS ABS: 3.3 10*3/uL (ref 1.4–6.5)
NEUTROS PCT: 66 %
Platelets: 182 10*3/uL (ref 150–440)
RBC: 4.18 MIL/uL (ref 3.80–5.20)
RDW: 12.6 % (ref 11.5–14.5)
WBC: 5 10*3/uL (ref 3.6–11.0)

## 2015-08-17 LAB — COMPREHENSIVE METABOLIC PANEL
ALK PHOS: 73 U/L (ref 38–126)
ALT: 12 U/L — AB (ref 14–54)
AST: 19 U/L (ref 15–41)
Albumin: 4.4 g/dL (ref 3.5–5.0)
Anion gap: 9 (ref 5–15)
BILIRUBIN TOTAL: 0.5 mg/dL (ref 0.3–1.2)
BUN: 16 mg/dL (ref 6–20)
CALCIUM: 9.2 mg/dL (ref 8.9–10.3)
CO2: 26 mmol/L (ref 22–32)
CREATININE: 0.94 mg/dL (ref 0.44–1.00)
Chloride: 103 mmol/L (ref 101–111)
GFR calc Af Amer: >60 mL/min (ref >60)
Glucose, Bld: 95 mg/dL (ref 65–99)
Potassium: 3.8 mmol/L (ref 3.5–5.1)
Sodium: 138 mmol/L (ref 135–145)
TOTAL PROTEIN: 7.3 g/dL (ref 6.5–8.1)

## 2015-08-17 MED ORDER — HEPARIN SOD (PORK) LOCK FLUSH 100 UNIT/ML IV SOLN
500.0000 [IU] | Freq: Once | INTRAVENOUS | Status: AC
  Administered 2015-08-17: 500 [IU] via INTRAVENOUS

## 2015-08-17 MED ORDER — SODIUM CHLORIDE 0.9% FLUSH
10.0000 mL | INTRAVENOUS | Status: DC | PRN
  Administered 2015-08-17: 10 mL via INTRAVENOUS
  Filled 2015-08-17: qty 10

## 2015-08-17 NOTE — Progress Notes (Signed)
[place]ara Grant @ [facility] Telephone:([phone]  Fax:([phone]     Kiara Grant OB: [birth year]  MR#: [id number]  YQM#:[id number]  Patient Care Team: Chad Cordial, PA-C as PCP - General (Physician Assistant) Robert Bellow, MD (General Surgery) Chad Cordial, PA-C as Referring Physician (Physician Assistant) [name], MD (Oncology)  CHIEF COMPLAINT:  Chief Complaint  Patient presents with  . Breast Cancer   Oncology History   1. Carcinoma of right breast T1 be N1 M0 tumor stage II diagnosis in December of 2015 (right lower and outer quadrant). Needle biopsy positive of the right, Axillary lymph node (December, 2015), positive for metastatic breast carcinoma. Estrogen receptor negative.  Progesterone receptor weakly positive.  HER-2/neu receptor negative. 2. Chemotherapy with Cytoxan, Adriamycin,  followed by Taxol (neoadjuvant therapy) April 25, 2014., 3.finished 4 cycles of chemotherapy with Cytoxan and Adriamycin on 7 th  March, 2016 4.started Taxol on a weekly basis from March 29 5.  Patient will finish weekly Taxol on 13th of June, 2016 6.  Patient had lumpectomy and axillary node evaluation. pT1b  pN1 cMO tumor was found.  (July, 2016) 7.  Genetic mutation.  No clinically significant mutation identified (December, 2015)     Patient is finished radiation therapy(September of 2016)     INTERVAL HISTORY: 45 year old lady with stage II carcinoma of breast on adjuvant chemotherapy.  She has finished up radiation therapy. Patient is here for further follow-up. 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 sHE  is here for ongoing evaluation and treatment consideration. No bony pains.  Getting regular mammograms done    Gen. status: Patient is in good performance status.  No chills or fever Performance status is 0 HEENT: No soreness in the mouth no difficulty swallowing Lungs: No cough or shortness of breath Cardiac:  No chest pain.  No shortness of breath.  No paroxysmal nocturnal dyspnea. GI: Normal.  No nausea.  No vomiting.  No diarrhea.  No abdominal pain Lower extremity no edema Skin: No rash Neurological system: No headache no dizziness.  No other focal symptoms.  Intermittent tingling numbness in upper and lower extremity GU: No dysuria or hematuria  As per HPI. Otherwise, a complete review of systems is negatve.  PAST MEDICAL HISTORY: Past Medical History  Diagnosis Date  . Cancer [facility]) 03-24-14    Right breast, microcalcifications. ER negative, PR less than 10%, HER-2/neu not amplified.  . Melanoma [facility] Sept 2011    Left neck T1a, 0.35 mm. Treated by Mohs injury [facility].  . Breast cancer ([place]) 03/2014    Right, T1b, N1 prior to neo-adjuvant chemotherapy. ER negative, PR less than 10%, HER-2/neu not overexpressed.  Marland Kitchen GERD (gastroesophageal reflux disease)   . PONV (postoperative nausea and vomiting)     PAST SURGICAL HISTORY: Past Surgical History  Procedure Laterality Date  . Breast biopsy Right 03-24-14  . Melanoma excision  Nov 2011    neck  . Portacath placement  Dec 2015  . Breast lumpectomy with sentinel lymph node biopsy Right 10/28/2014    Procedure: right breast wide excision with sentinel node biopsy, mastoplasty, axillary dissection ;  Surgeon: Robert Bellow, MD;  Location: [facility] ORS;  Service: General;  Laterality: Right;    FAMILY HISTORY Family History  Problem Relation Age of Onset  . Cancer Mother     lung ? mid 50    Significant History/PMH:   Melanoma:    Breast Cancer:    Melanoma excision:  Breast Biopsy:   Preventive Screening:  Has patient had any of the following test? Colonscopy  Mammography  Pap Smear (1)   Last Colonoscopy: Never(1)   Last Mammography: 2015(1)   Last Pap Smear: 2015(1)   Smoking History: Smoking History Never Smoked.(1)  PFSH: Family History: noncontributory  Comments: not examinedo family history of  breast cancer  Social History: negative alcohol, negative tobacco       ADVANCED DIRECTIVES:  Patient  does not have any advance care directive  HEALTH MAINTENANCE: Social History  Substance Use Topics  . Smoking status: Never Smoker   . Smokeless tobacco: Never Used  . Alcohol Use: 0.0 oz/week    0 Standard drinks or equivalent per week     Comment: 3/week       Allergies  Allergen Reactions  . Sulfa Antibiotics Nausea Only    Current Outpatient Prescriptions  Medication Sig Dispense Refill  . Biotin 10 MG CAPS Take by mouth.    . minocycline (DYNACIN) 100 MG tablet   0   No current facility-administered medications for this visit.   Facility-Administered Medications Ordered in Other Visits  Medication Dose Route Frequency Provider Last Rate Last Dose  . sodium chloride 0.9 % injection 10 mL  10 mL Intracatheter PRN [name], MD   10 mL at 08/23/14 1430  . sodium chloride 0.9 % injection 10 mL  10 mL Intracatheter PRN [name], MD   10 mL at 09/06/14 1400  . sodium chloride 0.9 % injection 10 mL  10 mL Intracatheter PRN [name], MD   10 mL at 09/13/14 1430  . sodium chloride 0.9 % injection 10 mL  10 mL Intravenous PRN Evlyn Kanner, NP   10 mL at 11/08/14 1207  . sodium chloride flush (NS) 0.9 % injection 10 mL  10 mL Intravenous PRN [name], MD   10 mL at 08/17/15 1513    OBJECTIVE:  Filed Vitals:   08/17/15 1521  BP: 120/74  Pulse: 81  Temp: 96.8 F (36 C)  Resp: 18     Body mass index is 21.41 kg/(m^2).    ECOG FS:0 - Asymptomatic  PHYSICAL EXAM: Goal status: Performance status is good.  Patient has not lost significant weight HEENT: No evidence of stomatitis.  And alopecia Sclera and conjunctivae :: No jaundice.   pale looking . Lungs: Air  entry equal on both sides.  No rhonchi.  No rales.  Cardiac: Heart sounds are normal.  No pericardial rub.  No murmur. Lymphatic system: Cervical, axillary, inguinal, lymph nodes not  palpable GI: Abdomen is soft.  No ascites.  Liver spleen not palpable.  No tenderness.  Bowel sounds are within normal limit Lower extremity: No edema Neurological system: Higher functions, cranial nerves intact No evidence of peripheral neuropathy. Skin: No rash.  No ecchymosis..  Left breast free of masses.  Right breast: Lumpectomy wound is healing postradiation changes   LAB RESULTS:  Appointment on 08/17/2015  Component Date Value Ref Range Status  . WBC 08/17/2015 5.0  3.6 - 11.0 K/uL Final  . RBC 08/17/2015 4.18  3.80 - 5.20 MIL/uL Final  . Hemoglobin 08/17/2015 13.0  12.0 - 16.0 g/dL Final  . HCT 08/17/2015 37.1  35.0 - 47.0 % Final  . MCV 08/17/2015 88.7  80.0 - 100.0 fL Final  . MCH 08/17/2015 31.1  26.0 - 34.0 pg Final  . MCHC 08/17/2015 35.1  32.0 - 36.0 g/dL Final  . RDW 08/17/2015 12.6  11.5 - 14.5 % Final  . Platelets 08/17/2015 182  150 - 440 K/uL Final  . Neutrophils Relative % 08/17/2015 66   Final  . Neutro Abs 08/17/2015 3.3  1.4 - 6.5 K/uL Final  . Lymphocytes Relative 08/17/2015 23   Final  . Lymphs Abs 08/17/2015 1.2  1.0 - 3.6 K/uL Final  . Monocytes Relative 08/17/2015 8   Final  . Monocytes Absolute 08/17/2015 0.4  0.2 - 0.9 K/uL Final  . Eosinophils Relative 08/17/2015 2   Final  . Eosinophils Absolute 08/17/2015 0.1  0 - 0.7 K/uL Final  . Basophils Relative 08/17/2015 1   Final  . Basophils Absolute 08/17/2015 0.0  0 - 0.1 K/uL Final    Lab Results  Component Value Date   LABCA2 10.1 04/25/2014   No results found for: CA199 Lab Results  Component Value Date   CEA 1.6 03/30/2014   CA 2 7.29 and apparent April, 2017) 9.7  ASSESSMENT: 45 year old lady with states to carcinoma breast here for continuation of Taxol chemotherapy Patient had a recent lumpectomy and lymph node evaluation Postsurgical stage II disease No evidence of recurrent disease Continue follow-up without any intervention.  because of my planned retirement patient will be  followed by my associate    Patient expressed understanding and was in agreement with this plan. She also understands that She can call clinic at any time with any questions, concerns, or complaints.    Breast cancer   Staging form: Breast, AJCC 7th Edition     Clinical: Stage IIA (T1b, N1, M0) - Signed by [name], MD on 08/01/2014   [name], MD   08/17/2015 4:08 PM

## 2015-08-17 NOTE — Progress Notes (Signed)
Patient states she has been having pain in her elbow joints.

## 2015-08-18 LAB — CANCER ANTIGEN 27.29: CA 27.29: 9.7 U/mL (ref 0.0–38.6)

## 2015-08-19 ENCOUNTER — Encounter: Payer: Self-pay | Admitting: Oncology

## 2015-08-22 ENCOUNTER — Encounter: Payer: Self-pay | Admitting: Oncology

## 2015-09-26 ENCOUNTER — Ambulatory Visit
Admission: RE | Admit: 2015-09-26 | Discharge: 2015-09-26 | Disposition: A | Discharge status: Home / Self Care | Payer: BLUE CROSS/BLUE SHIELD | Source: Ambulatory Visit | Attending: General Surgery | Admitting: General Surgery

## 2015-09-26 ENCOUNTER — Other Ambulatory Visit: Payer: Self-pay | Admitting: General Surgery

## 2015-09-26 DIAGNOSIS — Z9889 Other specified postprocedural states: Secondary | ICD-10-CM | POA: Insufficient documentation

## 2015-09-26 DIAGNOSIS — Z08 Encounter for follow-up examination after completed treatment for malignant neoplasm: Secondary | ICD-10-CM | POA: Insufficient documentation

## 2015-09-26 DIAGNOSIS — C50911 Malignant neoplasm of unspecified site of right female breast: Secondary | ICD-10-CM

## 2015-09-26 DIAGNOSIS — Z853 Personal history of malignant neoplasm of breast: Secondary | ICD-10-CM | POA: Insufficient documentation

## 2015-10-02 ENCOUNTER — Encounter: Payer: Self-pay | Admitting: General Surgery

## 2015-10-02 ENCOUNTER — Ambulatory Visit (INDEPENDENT_AMBULATORY_CARE_PROVIDER_SITE_OTHER): Payer: BLUE CROSS/BLUE SHIELD | Admitting: General Surgery

## 2015-10-02 VITALS — BP 126/70 | HR 74 | Resp 12 | Ht 64.0 in | Wt 124.0 lb

## 2015-10-02 DIAGNOSIS — C50911 Malignant neoplasm of unspecified site of right female breast: Secondary | ICD-10-CM

## 2015-10-02 NOTE — Patient Instructions (Addendum)
Patient will be asked to return to the office in one year with a bilateral diagnotic  Mammogram. May have port removed.

## 2015-10-02 NOTE — Progress Notes (Signed)
Patient ID: Kiara Grant, female   DOB: 1970/08/04, 45 y.o.   MRN: [id number]  Chief Complaint  Patient presents with  . Follow-up    mammogram    HPI Kiara Grant is a 45 y.o. female who presents for a breast evaluation. The most recent mammogram was done on 09/26/15. Patient does perform regular self breast checks and gets regular mammograms done. She is here today with her husband, Laprecious Austill.   The patient reports no breast issues at this time.  She reports that her dermatologist raised a question about a PET scan based on her previous melanoma.   I personally reviewed the patient's history.  HPI  Past Medical History  Diagnosis Date  . GERD (gastroesophageal reflux disease)   . PONV (postoperative nausea and vomiting)   . Cancer [facility]) 03-24-14    Right breast, microcalcifications. ER negative, PR less than 10%, HER-2/neu not amplified.  . Melanoma [facility]) Sept 2011    Left neck T1a, 0.35 mm. Treated by Mohs injury [facility].  . Breast cancer ([place]) 03/2014    Right, T1b, N1 prYpT1b,N1a, ER neg, PR < 10 %, Her 2 neu not overexpressed.     Past Surgical History  Procedure Laterality Date  . Melanoma excision  Nov 2011    neck  . Portacath placement  Dec 2015  . Breast lumpectomy with sentinel lymph node biopsy Right 10/28/2014    Procedure: right breast wide excision with sentinel node biopsy, mastoplasty, axillary dissection ;  Surgeon: Robert Bellow, MD;  Location: [facility] ORS;  Service: General;  Laterality: Right;  . Breast biopsy Right 03-24-14    +  . Breast excisional biopsy Right 10/28/2014    Lumpectomy +    Family History  Problem Relation Age of Onset  . Cancer Mother     lung ? mid 84    Social History Social History  Substance Use Topics  . Smoking status: Never Smoker   . Smokeless tobacco: Never Used  . Alcohol Use: 0.0 oz/week    0 Standard drinks or equivalent per week     Comment: 3/week    Allergies  Allergen Reactions  . Sulfa  Antibiotics Nausea Only    Current Outpatient Prescriptions  Medication Sig Dispense Refill  . Biotin 10 MG CAPS Take by mouth.    . minocycline (DYNACIN) 100 MG tablet   0   No current facility-administered medications for this visit.   Facility-Administered Medications Ordered in Other Visits  Medication Dose Route Frequency Provider Last Rate Last Dose  . sodium chloride 0.9 % injection 10 mL  10 mL Intracatheter PRN [name], MD   10 mL at 08/23/14 1430  . sodium chloride 0.9 % injection 10 mL  10 mL Intracatheter PRN [name], MD   10 mL at 09/06/14 1400  . sodium chloride 0.9 % injection 10 mL  10 mL Intracatheter PRN [name], MD   10 mL at 09/13/14 1430  . sodium chloride 0.9 % injection 10 mL  10 mL Intravenous PRN Evlyn Kanner, NP   10 mL at 11/08/14 1207    Review of Systems Review of Systems  Constitutional: Negative.   Respiratory: Negative.   Cardiovascular: Negative.     Blood pressure 126/70, pulse 74, resp. rate 12, height 5' 4"  (1.626 m), weight 124 lb (56.246 kg).  Physical Exam Physical Exam  Constitutional: She is oriented to person, place, and time. She appears well-developed and well-nourished.  Eyes: Conjunctivae  are normal.  Neck: Neck supple.  Cardiovascular: Normal rate, regular rhythm and normal heart sounds.   Pulmonary/Chest: Effort normal and breath sounds normal. Right breast exhibits no inverted nipple, no mass, no nipple discharge, no skin change and no tenderness. Left breast exhibits no inverted nipple, no mass, no nipple discharge, no skin change and no tenderness.    Right breast well healed incision at 9 o'clk.   Abdominal: Soft. Normal appearance and bowel sounds are normal. There is no hepatomegaly. There is no tenderness.  Lymphadenopathy:    She has no cervical adenopathy.    She has no axillary adenopathy.  Neurological: She is alert and oriented to person, place, and time.  Skin: Skin is warm and dry.    Data  Reviewed Bilateral mammograms dated 09/26/2015 were reviewed. BI-RADS 2.  Assessment    No evidence of recurrent cancer.    Plan      The patient reports that when asked about the port she was told to speak with the surgeon. She can have this removed at any time. The procedure was discussed. This will be completed in office.  The patient had her melanoma a 0.35 mm T1a lesion resected 4 years prior to her neoadjuvant chemotherapy. I think there is a the likelihood of occult metastatic disease that will of been activated by her treatment.     Patient will be asked to return to the office in one year with a bilateral diagnotic mammogram. PCP: Copland, Elmo Putt  This has been scribed by Lesly Rubenstein LPN    Robert Bellow 10/02/2015, 9:04 PM

## 2015-10-10 ENCOUNTER — Inpatient Hospital Stay: Payer: BLUE CROSS/BLUE SHIELD | Attending: Oncology

## 2015-10-10 DIAGNOSIS — Z452 Encounter for adjustment and management of vascular access device: Secondary | ICD-10-CM | POA: Insufficient documentation

## 2015-10-10 DIAGNOSIS — Z171 Estrogen receptor negative status [ER-]: Secondary | ICD-10-CM | POA: Insufficient documentation

## 2015-10-10 DIAGNOSIS — C50511 Malignant neoplasm of lower-outer quadrant of right female breast: Secondary | ICD-10-CM | POA: Insufficient documentation

## 2015-10-10 DIAGNOSIS — Z95828 Presence of other vascular implants and grafts: Secondary | ICD-10-CM

## 2015-10-10 MED ORDER — HEPARIN SOD (PORK) LOCK FLUSH 100 UNIT/ML IV SOLN
500.0000 [IU] | Freq: Once | INTRAVENOUS | Status: AC
  Administered 2015-10-10: 500 [IU] via INTRAVENOUS
  Filled 2015-10-10: qty 5

## 2015-10-10 MED ORDER — SODIUM CHLORIDE 0.9% FLUSH
10.0000 mL | INTRAVENOUS | Status: DC | PRN
  Administered 2015-10-10: 10 mL via INTRAVENOUS
  Filled 2015-10-10: qty 10

## 2015-11-09 ENCOUNTER — Encounter: Payer: Self-pay | Admitting: Oncology

## 2015-11-09 ENCOUNTER — Telehealth: Payer: Self-pay | Admitting: Internal Medicine

## 2015-11-09 NOTE — Telephone Encounter (Signed)
Stage II breast cancer; okay to have port taken out. Dr.B

## 2015-11-10 NOTE — Telephone Encounter (Signed)
Fax sent to Dr. Dwyane Luo office to remove port.

## 2015-11-10 NOTE — Telephone Encounter (Signed)
Please schedule for port removal in office

## 2015-11-17 ENCOUNTER — Encounter: Payer: Self-pay | Admitting: *Deleted

## 2015-11-20 ENCOUNTER — Ambulatory Visit (INDEPENDENT_AMBULATORY_CARE_PROVIDER_SITE_OTHER): Payer: BLUE CROSS/BLUE SHIELD | Admitting: General Surgery

## 2015-11-20 ENCOUNTER — Encounter: Payer: Self-pay | Admitting: General Surgery

## 2015-11-20 VITALS — BP 118/74 | HR 78 | Resp 12 | Ht 64.0 in | Wt 124.0 lb

## 2015-11-20 DIAGNOSIS — C50011 Malignant neoplasm of nipple and areola, right female breast: Secondary | ICD-10-CM | POA: Diagnosis not present

## 2015-11-20 NOTE — Progress Notes (Signed)
Patient ID: Kiara Grant, female   DOB: Mar 24, 1971, 45 y.o.   MRN: [id number]  No chief complaint on file.   Kiara Grant is a 45 y.o. female here today for port removal procedure.  Medical oncology has signed off on the procedure. Kiara  Past Medical History:  Diagnosis Date  . Breast cancer ([place]) 03/2014   Right, T1b, N1 prYpT1b,N1a, ER neg, PR < 10 %, Her 2 neu not overexpressed.   . Cancer [facility]) 03-24-14   Right breast, microcalcifications. ER negative, PR less than 10%, HER-2/neu not amplified.  Marland Kitchen GERD (gastroesophageal reflux disease)   . Melanoma [facility]) Sept 2011   Left neck T1a, 0.35 mm. Treated by Mohs injury [facility].  Marland Kitchen PONV (postoperative nausea and vomiting)     Past Surgical History:  Procedure Laterality Date  . BREAST BIOPSY Right 03-24-14   +  . BREAST EXCISIONAL BIOPSY Right 10/28/2014   Lumpectomy +  . BREAST LUMPECTOMY WITH SENTINEL LYMPH NODE BIOPSY Right 10/28/2014   Procedure: right breast wide excision with sentinel node biopsy, mastoplasty, axillary dissection ;  Surgeon: Robert Bellow, MD;  Location: [facility] ORS;  Service: General;  Laterality: Right;  . MELANOMA EXCISION  Nov 2011   neck  . PORTACATH PLACEMENT  Dec 2015    Family History  Problem Relation Age of Onset  . Cancer Mother     lung ? mid 56    Social History Social History  Substance Use Topics  . Smoking status: Never Smoker  . Smokeless tobacco: Never Used  . Alcohol use 0.0 oz/week     Comment: 3/week    Allergies  Allergen Reactions  . Sulfa Antibiotics Nausea Only    Current Outpatient Prescriptions  Medication Sig Dispense Refill  . Biotin 10 MG CAPS Take by mouth.     No current facility-administered medications for this visit.    Facility-Administered Medications Ordered in Other Visits  Medication Dose Route Frequency Provider Last Rate Last Dose  . sodium chloride 0.9 % injection 10 mL  10 mL Intracatheter PRN [name], MD   10 mL at 08/23/14  1430  . sodium chloride 0.9 % injection 10 mL  10 mL Intracatheter PRN [name], MD   10 mL at 09/06/14 1400  . sodium chloride 0.9 % injection 10 mL  10 mL Intracatheter PRN [name], MD   10 mL at 09/13/14 1430  . sodium chloride 0.9 % injection 10 mL  10 mL Intravenous PRN Evlyn Kanner, NP   10 mL at 11/08/14 1207    Review of Systems Review of Systems  Constitutional: Negative.   Respiratory: Negative.   Cardiovascular: Negative.     Blood pressure 118/74, pulse 78, resp. rate 12, height 5' 4"  (1.626 m), weight 124 lb (56.2 kg).  Physical Exam Physical Exam  Constitutional: She is oriented to person, place, and time. She appears well-developed and well-nourished.  Pulmonary/Chest:    Neurological: She is alert and oriented to person, place, and time.  Skin: Skin is warm and dry.       Assessment    Candidate for port removal.    Plan    The procedure was reviewed to the patient and her husband. She was amenable to proceed. The area was cleaned with alcohol and 10 mL of 0.5% Xylocaine with 0.25% Marcaine with 1-200,000 units of epinephrine was instilled and well tolerated. ChloraPrep was applied to the skin. The previous incision was opened and the  skin and subcutaneous tissue divided sharply. The transfixion sutures were removed and the port was mobilized. The catheter tip was removed intact. No bleeding was noted. The wound was closed with a running 3-0 Vicryl chew to the adipose layer obliterating the port cavity and the skin closed with a running 3-0 Vicryl subcuticular suture. Benzoin, Steri-Strips, Telfa and Tegaderm dressing applied.  Postprocedure instructions provided.  The outer dressing will be removed in 3-4 days at her convenience. She will reports there is any wound concerns and to return for follow-up exam if needed. Follow up otherwise will be as previously scheduled in regards to her breast cancer.       Robert Bellow 11/21/2015, 5:44  AM

## 2015-11-21 ENCOUNTER — Encounter: Payer: Self-pay | Admitting: General Surgery

## 2016-02-13 ENCOUNTER — Ambulatory Visit: Payer: BLUE CROSS/BLUE SHIELD | Admitting: Internal Medicine

## 2016-02-13 ENCOUNTER — Other Ambulatory Visit: Payer: BLUE CROSS/BLUE SHIELD

## 2016-02-14 ENCOUNTER — Other Ambulatory Visit: Payer: Self-pay

## 2016-02-14 DIAGNOSIS — C50911 Malignant neoplasm of unspecified site of right female breast: Secondary | ICD-10-CM

## 2016-02-15 ENCOUNTER — Ambulatory Visit: Payer: BLUE CROSS/BLUE SHIELD | Admitting: Radiation Oncology

## 2016-02-15 ENCOUNTER — Ambulatory Visit: Payer: BLUE CROSS/BLUE SHIELD | Admitting: Internal Medicine

## 2016-02-15 ENCOUNTER — Other Ambulatory Visit: Payer: BLUE CROSS/BLUE SHIELD

## 2016-02-20 ENCOUNTER — Inpatient Hospital Stay: Payer: BLUE CROSS/BLUE SHIELD

## 2016-02-20 ENCOUNTER — Encounter: Payer: Self-pay | Admitting: Internal Medicine

## 2016-02-20 ENCOUNTER — Inpatient Hospital Stay: Payer: BLUE CROSS/BLUE SHIELD | Attending: Internal Medicine | Admitting: Internal Medicine

## 2016-02-20 VITALS — BP 109/71 | HR 75 | Temp 97.0°F | Ht 64.0 in | Wt 127.1 lb

## 2016-02-20 DIAGNOSIS — C50511 Malignant neoplasm of lower-outer quadrant of right female breast: Secondary | ICD-10-CM | POA: Diagnosis not present

## 2016-02-20 DIAGNOSIS — Z9221 Personal history of antineoplastic chemotherapy: Secondary | ICD-10-CM | POA: Diagnosis not present

## 2016-02-20 DIAGNOSIS — Z171 Estrogen receptor negative status [ER-]: Secondary | ICD-10-CM

## 2016-02-20 DIAGNOSIS — C50911 Malignant neoplasm of unspecified site of right female breast: Secondary | ICD-10-CM

## 2016-02-20 DIAGNOSIS — Z85820 Personal history of malignant melanoma of skin: Secondary | ICD-10-CM | POA: Diagnosis not present

## 2016-02-20 DIAGNOSIS — Z79899 Other long term (current) drug therapy: Secondary | ICD-10-CM | POA: Diagnosis not present

## 2016-02-20 DIAGNOSIS — K219 Gastro-esophageal reflux disease without esophagitis: Secondary | ICD-10-CM | POA: Diagnosis not present

## 2016-02-20 DIAGNOSIS — C773 Secondary and unspecified malignant neoplasm of axilla and upper limb lymph nodes: Secondary | ICD-10-CM | POA: Diagnosis not present

## 2016-02-20 LAB — COMPREHENSIVE METABOLIC PANEL
ALT: 11 U/L — ABNORMAL LOW (ref 14–54)
ANION GAP: 7 (ref 5–15)
AST: 20 U/L (ref 15–41)
Albumin: 4.3 g/dL (ref 3.5–5.0)
Alkaline Phosphatase: 62 U/L (ref 38–126)
BUN: 12 mg/dL (ref 6–20)
CHLORIDE: 101 mmol/L (ref 101–111)
CO2: 29 mmol/L (ref 22–32)
Calcium: 9.1 mg/dL (ref 8.9–10.3)
Creatinine, Ser: 0.95 mg/dL (ref 0.44–1.00)
Glucose, Bld: 117 mg/dL — ABNORMAL HIGH (ref 65–99)
POTASSIUM: 3.5 mmol/L (ref 3.5–5.1)
SODIUM: 137 mmol/L (ref 135–145)
Total Bilirubin: 0.5 mg/dL (ref 0.3–1.2)
Total Protein: 7.5 g/dL (ref 6.5–8.1)

## 2016-02-20 LAB — CBC WITH DIFFERENTIAL/PLATELET
Basophils Absolute: 0 10*3/uL (ref 0–0.1)
Basophils Relative: 1 %
EOS ABS: 0.1 10*3/uL (ref 0–0.7)
EOS PCT: 3 %
HCT: 40.3 % (ref 35.0–47.0)
Hemoglobin: 13.7 g/dL (ref 12.0–16.0)
LYMPHS ABS: 1.1 10*3/uL (ref 1.0–3.6)
LYMPHS PCT: 25 %
MCH: 30.7 pg (ref 26.0–34.0)
MCHC: 34.1 g/dL (ref 32.0–36.0)
MCV: 90.2 fL (ref 80.0–100.0)
MONO ABS: 0.3 10*3/uL (ref 0.2–0.9)
Monocytes Relative: 6 %
Neutro Abs: 2.8 10*3/uL (ref 1.4–6.5)
Neutrophils Relative %: 65 %
PLATELETS: 159 10*3/uL (ref 150–440)
RBC: 4.47 MIL/uL (ref 3.80–5.20)
RDW: 12.6 % (ref 11.5–14.5)
WBC: 4.3 10*3/uL (ref 3.6–11.0)

## 2016-02-20 NOTE — Assessment & Plan Note (Signed)
#  Stage II ER negative PR slightly positive HER-2/neu negative breast cancer. Finished adjuvant therapy 2016. Recent mammogram June 2017 within normal limits. Clinically no evidence of recurrence.  # Recommend follow-up in 6 months or sooner; labs reviewed the normal limits. Check CBC CMP CA 2729 6 months.

## 2016-02-20 NOTE — Progress Notes (Signed)
Kiara Grant OFFICE PROGRESS NOTE  Patient Care Team: Chad Cordial, PA-C as PCP - General (Physician Assistant) Robert Bellow, MD (General Surgery) Chad Cordial, PA-C as Referring Physician (Physician Assistant) [name], MD (Oncology)  No matching staging information was found for the patient.   Oncology History   # DEC 2015-  Carcinoma of right breast T1 be N1 M0 tumor stage II diagnosis in December of 2015 (right lower and outer quadrant). Needle biopsy positive of the right, Axillary lymph node (December, 2015), positive for metastatic breast carcinoma. Estrogen receptor negative.  Progesterone receptor weakly positive.  HER-2/neu receptor negative. # JAN 2016- NEO-ADJ CHEMO- Cytoxan, Adriamycin,  followed by Taxol (neoadjuvant therapy) April 25, 2014., 3.finished 4 cycles of chemotherapy with Cytoxan and Adriamycin on 7 th  March, 2016 4.started Taxol on a weekly basis from March 29 5.  Patient will finish weekly Taxol on 13th of June, 2016 6.  Patient had lumpectomy and axillary node evaluation. ypT1b [84m]  ypN1 c (3 /12LN)MO [Dr.Byrnett].  (July, 2016) s/p RT.   7.  Genetic mutation.  No clinically significant mutation identified (December, 2015)  # MELANOMA left neck [Dr.Graham- 2011]     Carcinoma of lower-outer quadrant of right breast in female, estrogen receptor negative ([place]   01/30/2015 Initial Diagnosis    Breast cancer, female, right       This is my first interaction with the patient as patient's primary oncologist has been Dr.Choksi. I reviewed the patient's prior charts/pertinent labs/imaging in detail; findings are summarized above.     INTERVAL HISTORY:  Kiara [id number].o.  female pleasant patient above history of Stage II ER negative PR-weakly positive; HER-2/neu negative breast cancer is here for follow-up.  Patient denies any new lumps or bumps. Patient had her port taken out recently. She denies any new lumps or  bumps. Complains of mild twinge of pain at the site of her port explantation.  REVIEW OF SYSTEMS:  A complete 10 point review of system is done which is negative except mentioned above/history of present illness.   PAST MEDICAL HISTORY :  Past Medical History:  Diagnosis Date  . BRCA negative 03/2014   [facility]  . [place]   Right, T1b, N1 prYpT1b,N1a, ER neg, PR < 10 %, Her 2 neu not overexpressed.   . Cancer ([facility] 03-24-14   Right breast, microcalcifications. ER negative, PR less than 10%, HER-2/neu not amplified.  .Marland KitchenGERD (gastroesophageal reflux disease)   . Melanoma ([facility] Sept 2011   Left neck T1a, 0.35 mm. [facility]  .Marland KitchenPONV (postoperative nausea and vomiting)     PAST SURGICAL HISTORY :   Past Surgical History:  Procedure Laterality Date  . BREAST BIOPSY Right 03-24-14   +  . BREAST EXCISIONAL BIOPSY Right 10/28/2014   Lumpectomy +  . BREAST LUMPECTOMY WITH SENTINEL LYMPH NODE BIOPSY Right 10/28/2014   Procedure: right breast wide excision with sentinel node biopsy, mastoplasty, axillary dissection ;  Surgeon: JRobert Bellow MD;  Location: [facility] ORS;  Service: General;  Laterality: Right;  . MELANOMA EXCISION  Nov 2011   neck  . PORTACATH PLACEMENT  Dec 2015    FAMILY HISTORY :   Family History  Problem Relation Age of Onset  . Cancer Mother     lung ? mid 521   SOCIAL HISTORY:   Social History  Substance Use Topics  . Smoking  status: Never Smoker  . Smokeless tobacco: Never Used  . Alcohol use 0.0 oz/week     Comment: 3/week    ALLERGIES:  is allergic to sulfa antibiotics.  MEDICATIONS:  Current Outpatient Prescriptions  Medication Sig Dispense Refill  . Biotin 10 MG CAPS Take by mouth.     No current facility-administered medications for this visit.    Facility-Administered Medications Ordered in Other Visits  Medication Dose Route Frequency Provider Last Rate Last Dose  .  sodium chloride 0.9 % injection 10 mL  10 mL Intravenous PRN Evlyn Kanner, NP   10 mL at 11/08/14 1207    PHYSICAL EXAMINATION: ECOG PERFORMANCE STATUS: 0 - Asymptomatic  BP 109/71 (BP Location: Left Arm, Patient Position: Sitting)   Pulse 75   Temp 97 F (36.1 C) (Tympanic)   Ht 5' 4"  (1.626 m)   Wt 127 lb 1.5 oz (57.6 kg)   BMI 21.82 kg/m   Filed Weights   02/20/16 0846  Weight: 127 lb 1.5 oz (57.6 kg)    GENERAL: Well-nourished well-developed; Alert, no distress and comfortable.   With her husband.  EYES: no pallor or icterus OROPHARYNX: no thrush or ulceration; good dentition  NECK: supple, no masses felt LYMPH:  no palpable lymphadenopathy in the cervical, axillary or inguinal regions LUNGS: clear to auscultation and  No wheeze or crackles HEART/CVS: regular rate & rhythm and no murmurs; No lower extremity edema ABDOMEN:abdomen soft, non-tender and normal bowel sounds Musculoskeletal:no cyanosis of digits and no clubbing  PSYCH: alert & oriented x 3 with fluent speech NEURO: no focal motor/sensory deficits SKIN:  no rashes or significant lesions Right and left BREAST exam [in the presence of nurse]- no unusual skin changes or dominant masses felt. Surgical scars noted.    LABORATORY DATA:  I have reviewed the data as listed    Component Value Date/Time   NA 137 02/20/2016 0840   NA 138 08/16/2014 1332   K 3.5 02/20/2016 0840   K 3.7 08/16/2014 1332   CL 101 02/20/2016 0840   CL 106 08/16/2014 1332   CO2 29 02/20/2016 0840   CO2 27 08/16/2014 1332   GLUCOSE 117 (H) 02/20/2016 0840   GLUCOSE 118 (H) 08/16/2014 1332   BUN 12 02/20/2016 0840   BUN 9 08/16/2014 1332   CREATININE 0.95 02/20/2016 0840   CREATININE 0.71 08/16/2014 1332   CALCIUM 9.1 02/20/2016 0840   CALCIUM 9.0 08/16/2014 1332   PROT 7.5 02/20/2016 0840   PROT 7.2 08/16/2014 1332   ALBUMIN 4.3 02/20/2016 0840   ALBUMIN 4.4 08/16/2014 1332   AST 20 02/20/2016 0840   AST 28 08/16/2014 1332    ALT 11 (L) 02/20/2016 0840   ALT 31 08/16/2014 1332   ALKPHOS 62 02/20/2016 0840   ALKPHOS 45 08/16/2014 1332   BILITOT 0.5 02/20/2016 0840   BILITOT 0.4 08/16/2014 1332   GFRNONAA >60 02/20/2016 0840   GFRNONAA >60 08/16/2014 1332   GFRAA >60 02/20/2016 0840   GFRAA >60 08/16/2014 1332    No results found for: SPEP, UPEP  Lab Results  Component Value Date   WBC 4.3 02/20/2016   NEUTROABS 2.8 02/20/2016   HGB 13.7 02/20/2016   HCT 40.3 02/20/2016   MCV 90.2 02/20/2016   PLT 159 02/20/2016      Chemistry      Component Value Date/Time   NA 137 02/20/2016 0840   NA 138 08/16/2014 1332   K 3.5 02/20/2016 0840   K 3.7  08/16/2014 1332   CL 101 02/20/2016 0840   CL 106 08/16/2014 1332   CO2 29 02/20/2016 0840   CO2 27 08/16/2014 1332   BUN 12 02/20/2016 0840   BUN 9 08/16/2014 1332   CREATININE 0.95 02/20/2016 0840   CREATININE 0.71 08/16/2014 1332      Component Value Date/Time   CALCIUM 9.1 02/20/2016 0840   CALCIUM 9.0 08/16/2014 1332   ALKPHOS 62 02/20/2016 0840   ALKPHOS 45 08/16/2014 1332   AST 20 02/20/2016 0840   AST 28 08/16/2014 1332   ALT 11 (L) 02/20/2016 0840   ALT 31 08/16/2014 1332   BILITOT 0.5 02/20/2016 0840   BILITOT 0.4 08/16/2014 1332       RADIOGRAPHIC STUDIES: I have personally reviewed the radiological images as listed and agreed with the findings in the report. No results found.   ASSESSMENT & PLAN:  Carcinoma of lower-outer quadrant of right breast in female, estrogen receptor negative ([place]) # Stage II ER negative PR slightly positive HER-2/neu negative breast cancer. Finished adjuvant therapy 2016. Recent mammogram June 2017 within normal limits. Clinically no evidence of recurrence.  # Recommend follow-up in 6 months or sooner; labs reviewed the normal limits. Check CBC CMP CA 2729 6 months.   Orders Placed This Encounter  Procedures  . CBC with Differential    Standing Status:   Future    Standing Expiration Date:    02/19/2017  . Comprehensive metabolic panel    Standing Status:   Future    Standing Expiration Date:   02/19/2017  . Cancer antigen 27.29    Standing Status:   Future    Standing Expiration Date:   02/19/2017   All questions were answered. The patient knows to call the clinic with any problems, questions or concerns.      Cammie Sickle, MD 02/20/2016 9:12 AM

## 2016-02-20 NOTE — Progress Notes (Signed)
Patient here for follow up. No changes since last appointment.  

## 2016-02-28 ENCOUNTER — Encounter: Payer: Self-pay | Admitting: Radiation Oncology

## 2016-02-28 ENCOUNTER — Ambulatory Visit
Admission: RE | Admit: 2016-02-28 | Discharge: 2016-02-28 | Disposition: A | Discharge status: Home / Self Care | Payer: BLUE CROSS/BLUE SHIELD | Source: Ambulatory Visit | Attending: Radiation Oncology | Admitting: Radiation Oncology

## 2016-02-28 VITALS — BP 100/66 | HR 73 | Temp 97.3°F | Resp 18 | Ht 64.0 in | Wt 127.1 lb

## 2016-02-28 DIAGNOSIS — Z853 Personal history of malignant neoplasm of breast: Secondary | ICD-10-CM | POA: Insufficient documentation

## 2016-02-28 DIAGNOSIS — Z923 Personal history of irradiation: Secondary | ICD-10-CM | POA: Diagnosis not present

## 2016-02-28 DIAGNOSIS — Z171 Estrogen receptor negative status [ER-]: Secondary | ICD-10-CM

## 2016-02-28 DIAGNOSIS — Z9221 Personal history of antineoplastic chemotherapy: Secondary | ICD-10-CM | POA: Diagnosis not present

## 2016-02-28 DIAGNOSIS — C50511 Malignant neoplasm of lower-outer quadrant of right female breast: Secondary | ICD-10-CM

## 2016-02-28 NOTE — Progress Notes (Signed)
Radiation Oncology Follow up Note  Name: Kiara Grant   Date:   02/28/2016 MRN:  [id number] DOB: November 27, 1970    This 45 y.o. female presents to the clinic today for 1 year follow-up status post revision therapy to her right breast for stage II triple negative breast cancer status post new adjuvant chemotherapy.  REFERRING PROVIDER: [name], MD  HPI: Patient is a 45 year old female now out 1 year having completed whole breast radiation to her right breast status post new adjuvant chemotherapy for stage II (b N1 M0). Triple negative status post new adjuvant chemotherapy than lumpectomy and sentinel node biopsy. She is seen today and is doing well. She specifically denies breast tenderness cough or bone pain. She is currently not on anti-estrogen therapy based on the triple negative nature of her disease. Last mammogram back in June showed no evidence of disease.  COMPLICATIONS OF TREATMENT: none  FOLLOW UP COMPLIANCE: keeps appointments   PHYSICAL EXAM:  BP 100/66   Pulse 73   Temp 97.3 F (36.3 C)   Resp 18   Ht 5\' 4"  (1.626 m)   Wt 127 lb 1.5 oz (57.6 kg)   BMI 21.82 kg/m  Lungs are clear to A&P cardiac examination essentially unremarkable with regular rate and rhythm. No dominant mass or nodularity is noted in either breast in 2 positions examined. Incision is well-healed. No axillary or supraclavicular adenopathy is appreciated. Cosmetic result is excellent. Well-developed well-nourished patient in NAD. HEENT reveals PERLA, EOMI, discs not visualized.  Oral cavity is clear. No oral mucosal lesions are identified. Neck is clear without evidence of cervical or supraclavicular adenopathy. Lungs are clear to A&P. Cardiac examination is essentially unremarkable with regular rate and rhythm without murmur rub or thrill. Abdomen is benign with no organomegaly or masses noted. Motor sensory and DTR levels are equal and symmetric in the upper and lower extremities. Cranial nerves II  through XII are grossly intact. Proprioception is intact. No peripheral adenopathy or edema is identified. No motor or sensory levels are noted. Crude visual fields are within normal range.  RADIOLOGY RESULTS: Most recent mammogram reviewed showing no evidence of disease.  PLAN: Present time she continues to do well with no evidence of disease. I'm please were overall progress. I've asked to see her back in 47 year for follow-up. Patient knows to call with any concerns.  I would like to take this opportunity to thank you for allowing me to participate in the care of your patient.Armstead Peaks., MD

## 2016-04-05 ENCOUNTER — Other Ambulatory Visit: Payer: Self-pay | Admitting: *Deleted

## 2016-07-31 ENCOUNTER — Other Ambulatory Visit: Payer: Self-pay

## 2016-07-31 DIAGNOSIS — C50011 Malignant neoplasm of nipple and areola, right female breast: Secondary | ICD-10-CM

## 2016-07-31 DIAGNOSIS — Z171 Estrogen receptor negative status [ER-]: Principal | ICD-10-CM

## 2016-08-13 ENCOUNTER — Inpatient Hospital Stay (HOSPITAL_BASED_OUTPATIENT_CLINIC_OR_DEPARTMENT_OTHER): Payer: BLUE CROSS/BLUE SHIELD | Admitting: Internal Medicine

## 2016-08-13 ENCOUNTER — Inpatient Hospital Stay: Payer: BLUE CROSS/BLUE SHIELD | Attending: Oncology

## 2016-08-13 VITALS — BP 97/68 | HR 73 | Temp 97.6°F | Resp 18 | Ht 64.0 in | Wt 127.9 lb

## 2016-08-13 DIAGNOSIS — Z171 Estrogen receptor negative status [ER-]: Secondary | ICD-10-CM

## 2016-08-13 DIAGNOSIS — C50511 Malignant neoplasm of lower-outer quadrant of right female breast: Secondary | ICD-10-CM | POA: Diagnosis present

## 2016-08-13 DIAGNOSIS — Z85820 Personal history of malignant melanoma of skin: Secondary | ICD-10-CM | POA: Insufficient documentation

## 2016-08-13 DIAGNOSIS — Z923 Personal history of irradiation: Secondary | ICD-10-CM

## 2016-08-13 DIAGNOSIS — N644 Mastodynia: Secondary | ICD-10-CM | POA: Insufficient documentation

## 2016-08-13 DIAGNOSIS — K219 Gastro-esophageal reflux disease without esophagitis: Secondary | ICD-10-CM | POA: Insufficient documentation

## 2016-08-13 DIAGNOSIS — Z9221 Personal history of antineoplastic chemotherapy: Secondary | ICD-10-CM | POA: Diagnosis not present

## 2016-08-13 LAB — COMPREHENSIVE METABOLIC PANEL
ALBUMIN: 4.5 g/dL (ref 3.5–5.0)
ALK PHOS: 62 U/L (ref 38–126)
ALT: 13 U/L — AB (ref 14–54)
AST: 23 U/L (ref 15–41)
Anion gap: 7 (ref 5–15)
BILIRUBIN TOTAL: 0.5 mg/dL (ref 0.3–1.2)
BUN: 16 mg/dL (ref 6–20)
CO2: 29 mmol/L (ref 22–32)
CREATININE: 0.96 mg/dL (ref 0.44–1.00)
Calcium: 9.5 mg/dL (ref 8.9–10.3)
Chloride: 101 mmol/L (ref 101–111)
GFR calc Af Amer: >60 mL/min (ref >60)
GLUCOSE: 93 mg/dL (ref 65–99)
POTASSIUM: 4.3 mmol/L (ref 3.5–5.1)
Sodium: 137 mmol/L (ref 135–145)
TOTAL PROTEIN: 7.8 g/dL (ref 6.5–8.1)

## 2016-08-13 LAB — CBC WITH DIFFERENTIAL/PLATELET
BASOS ABS: 0 10*3/uL (ref 0–0.1)
BASOS PCT: 1 %
Eosinophils Absolute: 0.1 10*3/uL (ref 0–0.7)
Eosinophils Relative: 3 %
HEMATOCRIT: 41.3 % (ref 35.0–47.0)
HEMOGLOBIN: 13.9 g/dL (ref 12.0–16.0)
LYMPHS ABS: 1 10*3/uL (ref 1.0–3.6)
LYMPHS PCT: 19 %
MCH: 30.4 pg (ref 26.0–34.0)
MCHC: 33.7 g/dL (ref 32.0–36.0)
MCV: 90.1 fL (ref 80.0–100.0)
Monocytes Absolute: 0.4 10*3/uL (ref 0.2–0.9)
Monocytes Relative: 8 %
NEUTROS ABS: 3.7 10*3/uL (ref 1.4–6.5)
NEUTROS PCT: 69 %
Platelets: 166 10*3/uL (ref 150–440)
RBC: 4.58 MIL/uL (ref 3.80–5.20)
RDW: 12.5 % (ref 11.5–14.5)
WBC: 5.3 10*3/uL (ref 3.6–11.0)

## 2016-08-13 NOTE — Progress Notes (Signed)
Patient here for breast cancer follow-up. She has no medical complaints. She is not taking any medications.

## 2016-08-13 NOTE — Assessment & Plan Note (Addendum)
#  Stage II ER negative PR slightly positive HER-2/neu negative breast cancer. Finished adjuvant therapy 2016. Recent mammogram June 2017 within normal limits.  # Clinically no evidence of recurrence. Awaiting mammogram in June 2018/appointment with Dr. Marlyn Corporal.  # Right breast tenderness- no masses felt. If forced to let us know would recommend removing the mammogram sooner.   # Recommend follow-up in 6 months or sooner; labs reviewed the normal limits. Check CBC CMP CA 27- 29 6 months.

## 2016-08-13 NOTE — Progress Notes (Signed)
[place] OFFICE PROGRESS NOTE  Patient Care Team: Chad Cordial, PA-C as PCP - General (Physician Assistant) Robert Bellow, MD (General Surgery) Chad Cordial, PA-C as Referring Physician (Physician Assistant) [name], MD (Oncology)  Cancer Staging No matching staging information was found for the patient.   Oncology History   # DEC 2015-  Carcinoma of right breast T1 be N1 M0 tumor stage II diagnosis in December of 2015 (right lower and outer quadrant). Needle biopsy positive of the right, Axillary lymph node (December, 2015), positive for metastatic breast carcinoma. Estrogen receptor negative.  Progesterone receptor weakly positive.  HER-2/neu receptor negative. # JAN 2016- NEO-ADJ CHEMO- Cytoxan, Adriamycin,  followed by Taxol (neoadjuvant therapy) April 25, 2014., 3.finished 4 cycles of chemotherapy with Cytoxan and Adriamycin on 7 th  March, 2016 4.started Taxol on a weekly basis from March 29 5.  Patient will finish weekly Taxol on 13th of June, 2016 6.  Patient had lumpectomy and axillary node evaluation. ypT1b [56m]  ypN1 c (3 /12LN)MO [Dr.Byrnett].  (July, 2016) s/p RT.   7.  Genetic mutation.  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  female pleasant patient above history of Stage II ER negative PR-weakly positive; HER-2/neu negative breast cancer is here for follow-up.  Patient complains of slight tenderness around the medial aspect of the right breast. Denies any lumps or bumps. The pain is transient.  Patient denies any new lumps or bumps. No bone pain. No headaches. No vision changes.  REVIEW OF SYSTEMS:  A complete 10 point review of system is done which is  negative except mentioned above/history of present illness.   PAST MEDICAL HISTORY :  Past Medical History:  Diagnosis Date  . BRCA negative 03/2014   [facility]  . [place] [place]   Right, T1b, N1 prYpT1b,N1a, ER neg, PR < 10 %, Her 2 neu not overexpressed.   . Cancer ([facility] 03-24-14   Right breast, microcalcifications. ER negative, PR less than 10%, HER-2/neu not amplified.  .Marland KitchenGERD (gastroesophageal reflux disease)   . [facility]oma ([facility] Sept 2011   Left neck T1a, 0.[facility] mm[facility]  .Marland KitchenPONV (postoperative nausea and vomiting)     PAST SURGICAL HISTORY :   Past Surgical History:  Procedure Laterality Date  . BREAST BIOPSY Right 03-24-14   +  . BREAST EXCISIONAL BIOPSY Right 10/28/2014   Lumpectomy +  . BREAST LUMPECTOMY WITH SENTINEL LYMPH NODE BIOPSY Right 10/28/2014   Procedure: right breast wide excision with sentinel node biopsy, mastoplasty, axillary dissection ;  Surgeon: JRobert Bellow MD;  Location: [facility] ORS;  Service: General;  Laterality: Right;  . MELANOMA EXCISION  Nov 2011   neck  . PORTACATH PLACEMENT  Dec 2015    FAMILY HISTORY :   Family History  Problem Relation Age of Onset  . Cancer Mother     lung ? mid 89   SOCIAL HISTORY:   Social History  Substance Use Topics  . Smoking status: Never Smoker  . Smokeless tobacco: Never Used  . Alcohol use 0.0 oz/week     Comment: 3/week    ALLERGIES:  is allergic to sulfa antibiotics.  MEDICATIONS:  No current outpatient prescriptions on file.   No current facility-administered medications for this visit.    Facility-Administered Medications Ordered in Other Visits  Medication Dose Route Frequency Provider Last Rate Last Dose  . sodium chloride 0.9 % injection 10 mL  10 mL Intravenous PRN Evlyn Kanner, NP   10 mL at 11/08/14 1207    PHYSICAL EXAMINATION: ECOG PERFORMANCE STATUS: 0 - Asymptomatic  BP 97/68 (Patient Position: Sitting)    Pulse 73   Temp 97.6 F (36.4 C) (Tympanic)   Resp 18   Ht 5' 4"  (1.626 m)   Wt 127 lb 13.9 oz (58 kg)   BMI 21.95 kg/m   Filed Weights   08/13/16 0916  Weight: 127 lb 13.9 oz (58 kg)    GENERAL: Well-nourished well-developed; Alert, no distress and comfortable.   With her husband.  EYES: no pallor or icterus OROPHARYNX: no thrush or ulceration; good dentition  NECK: supple, no masses felt LYMPH:  no palpable lymphadenopathy in the cervical, axillary or inguinal regions LUNGS: clear to auscultation and  No wheeze or crackles HEART/CVS: regular rate & rhythm and no murmurs; No lower extremity edema ABDOMEN:abdomen soft, non-tender and normal bowel sounds Musculoskeletal:no cyanosis of digits and no clubbing  PSYCH: alert & oriented x 3 with fluent speech NEURO: no focal motor/sensory deficits SKIN:  no rashes or significant lesions Right and left BREAST exam [in the presence of nurse]- no unusual skin changes or dominant masses felt. Surgical scars noted.    LABORATORY DATA:  I have reviewed the data as listed    Component Value Date/Time   NA 137 08/13/2016 0903   NA 138 08/16/2014 1332   K 4.3 08/13/2016 0903   K 3.7 08/16/2014 1332   CL 101 08/13/2016 0903   CL 106 08/16/2014 1332   CO2 29 08/13/2016 0903   CO2 27 08/16/2014 1332   GLUCOSE 93 08/13/2016 0903   GLUCOSE 118 (H) 08/16/2014 1332   BUN 16 08/13/2016 0903   BUN 9 08/16/2014 1332   CREATININE 0.96 08/13/2016 0903   CREATININE 0.71 08/16/2014 1332   CALCIUM 9.5 08/13/2016 0903   CALCIUM 9.0 08/16/2014 1332   PROT 7.8 08/13/2016 0903   PROT 7.2 08/16/2014 1332   ALBUMIN 4.5 08/13/2016 0903   ALBUMIN 4.4 08/16/2014 1332   AST 23 08/13/2016 0903   AST 28 08/16/2014 1332   ALT 13 (L) 08/13/2016 0903   ALT 31 08/16/2014 1332   ALKPHOS 62 08/13/2016 0903   ALKPHOS 45 08/16/2014 1332   BILITOT 0.5 08/13/2016 0903   BILITOT 0.4 08/16/2014 1332   GFRNONAA >60 08/13/2016 0903   GFRNONAA >60 08/16/2014  1332   GFRAA >60 08/13/2016 0903   GFRAA >60 08/16/2014 1332    No results found for: SPEP, UPEP  Lab Results  Component Value Date   WBC 5.3 08/13/2016   NEUTROABS 3.7 08/13/2016   HGB 13.9 08/13/2016   HCT 41.3 08/13/2016   MCV 90.1 08/13/2016   PLT 166 08/13/2016      Chemistry      Component Value Date/Time   NA 137 08/13/2016 0903   NA 138 08/16/2014 1332   K 4.3 08/13/2016 0903   K 3.7 08/16/2014 1332   CL 101 08/13/2016 0903   CL 106 08/16/2014 1332   CO2 29 08/13/2016 0903   CO2 27 08/16/2014 1332   BUN 16 08/13/2016 0903   BUN 9 08/16/2014 1332  CREATININE 0.96 08/13/2016 0903   CREATININE 0.71 08/16/2014 1332      Component Value Date/Time   CALCIUM 9.5 08/13/2016 0903   CALCIUM 9.0 08/16/2014 1332   ALKPHOS 62 08/13/2016 0903   ALKPHOS 45 08/16/2014 1332   AST 23 08/13/2016 0903   AST 28 08/16/2014 1332   ALT 13 (L) 08/13/2016 0903   ALT 31 08/16/2014 1332   BILITOT 0.5 08/13/2016 0903   BILITOT 0.4 08/16/2014 1332       RADIOGRAPHIC STUDIES: I have personally reviewed the radiological images as listed and agreed with the findings in the report. No results found.   ASSESSMENT & PLAN:  Carcinoma of lower-outer quadrant of right breast in female, estrogen receptor negative (Kiara Grant) # Stage II ER negative PR slightly positive HER-2/neu negative breast cancer. Finished adjuvant therapy 2016. Recent mammogram June 2017 within normal limits.  # Clinically no evidence of recurrence. Awaiting mammogram in June 2018/appointment with Dr. Marlyn Corporal.  # Right breast tenderness- no masses felt. If forced to let us know would recommend removing the mammogram sooner.   # Recommend follow-up in 6 months or sooner; labs reviewed the normal limits. Check CBC CMP CA 27- 29 6 months.   Orders Placed This Encounter  Procedures  . CBC with Differential    Standing Status:   Future    Standing Expiration Date:   08/13/2017  . Comprehensive metabolic panel     Standing Status:   Future    Standing Expiration Date:   08/13/2017  . Cancer antigen 27.29    Standing Status:   Future    Standing Expiration Date:   08/13/2017   All questions were answered. The patient knows to call the clinic with any problems, questions or concerns.      Cammie Sickle, MD 08/13/2016 11:08 AM

## 2016-08-14 LAB — CANCER ANTIGEN 27.29: CA 27.29: 10.8 U/mL (ref 0.0–38.6)

## 2016-08-16 ENCOUNTER — Encounter: Payer: Self-pay | Admitting: Internal Medicine

## 2016-08-16 ENCOUNTER — Encounter: Payer: Self-pay | Admitting: *Deleted

## 2016-08-26 ENCOUNTER — Encounter: Payer: Self-pay | Admitting: Internal Medicine

## 2016-09-03 ENCOUNTER — Other Ambulatory Visit (HOSPITAL_COMMUNITY)
Admission: RE | Admit: 2016-09-03 | Discharge: 2016-09-03 | Disposition: A | Discharge status: Home / Self Care | Payer: BLUE CROSS/BLUE SHIELD | Source: Ambulatory Visit | Attending: General Surgery | Admitting: General Surgery

## 2016-09-03 ENCOUNTER — Encounter: Payer: Self-pay | Admitting: General Surgery

## 2016-09-03 ENCOUNTER — Inpatient Hospital Stay: Payer: Self-pay

## 2016-09-03 ENCOUNTER — Ambulatory Visit (INDEPENDENT_AMBULATORY_CARE_PROVIDER_SITE_OTHER): Payer: BLUE CROSS/BLUE SHIELD | Admitting: General Surgery

## 2016-09-03 VITALS — BP 110/68 | HR 74 | Resp 12 | Ht 64.0 in | Wt 116.0 lb

## 2016-09-03 DIAGNOSIS — N6310 Unspecified lump in the right breast, unspecified quadrant: Secondary | ICD-10-CM | POA: Insufficient documentation

## 2016-09-03 DIAGNOSIS — C50011 Malignant neoplasm of nipple and areola, right female breast: Secondary | ICD-10-CM

## 2016-09-03 DIAGNOSIS — Z171 Estrogen receptor negative status [ER-]: Secondary | ICD-10-CM

## 2016-09-03 DIAGNOSIS — N6313 Unspecified lump in the right breast, lower outer quadrant: Secondary | ICD-10-CM | POA: Diagnosis not present

## 2016-09-03 HISTORY — PX: INCISIONAL BREAST BIOPSY: SHX1812

## 2016-09-03 NOTE — Progress Notes (Signed)
Patient ID: Kiara Grant, female   DOB: [birth year], 46 y.o.   MRN: [id number]  Chief Complaint  Patient presents with  . Other    rash    HPI Kiara Grant is a 46 y.o. female.  Here for evaluation of a rash that is below the right breast surgical site. She states she noticed it about 2 weeks ago. She has tried benadryl and cortisone cream per Brahmanday. The area has not changed. No itching, the area is tender. Denies any injury or trauma.  HPI  Past Medical History:  Diagnosis Date  . BRCA negative 03/2014   Testing completed at [facility]  . Breast cancer ([place]) 03/2014   Right, T1b, N1 prYpT1b,N1a, ER neg, PR < 10 %, Her 2 neu not overexpressed.   . Cancer [facility]) 03-24-14   Right breast, microcalcifications. ER negative, PR less than 10%, HER-2/neu not amplified.  Marland Kitchen GERD (gastroesophageal reflux disease)   . Melanoma [facility]) Sept 2011   Left neck T1a, 0.35 mm. Treated by Mohs injury [facility].  Marland Kitchen PONV (postoperative nausea and vomiting)     Past Surgical History:  Procedure Laterality Date  . BREAST BIOPSY Right 03-24-14   +  . BREAST EXCISIONAL BIOPSY Right 10/28/2014   Lumpectomy +  . BREAST LUMPECTOMY WITH SENTINEL LYMPH NODE BIOPSY Right 10/28/2014   Procedure: right breast wide excision with sentinel node biopsy, mastoplasty, axillary dissection ;  Surgeon: Robert Bellow, MD;  Location: [facility] ORS;  Service: General;  Laterality: Right;  . MELANOMA EXCISION  Nov 2011   neck  . PORTACATH PLACEMENT  Dec 2015    Family History  Problem Relation Age of Onset  . Cancer Mother        lung ? mid 46    Social History Social History  Substance Use Topics  . Smoking status: Never Smoker  . Smokeless tobacco: Never Used  . Alcohol use 0.0 oz/week     Comment: 3/week    Allergies  Allergen Reactions  . Sulfa Antibiotics Nausea Only    No current outpatient prescriptions on file.   No current facility-administered medications for this visit.     Facility-Administered Medications Ordered in Other Visits  Medication Dose Route Frequency Provider Last Rate Last Dose  . sodium chloride 0.9 % injection 10 mL  10 mL Intravenous PRN Evlyn Kanner, NP   10 mL at 11/08/14 1207    Review of Systems Review of Systems  Blood pressure 110/68, pulse 74, resp. rate 12, height 5' 4"  (1.626 m), weight 116 lb (52.6 kg).  Physical Exam Physical Exam  Constitutional: She is oriented to person, place, and time. She appears well-developed and well-nourished.  HENT:  Mouth/Throat: Oropharynx is clear and moist.  Eyes: Conjunctivae are normal. No scleral icterus.  Neck: Neck supple.  Cardiovascular: Normal rate, regular rhythm and normal heart sounds.   Pulmonary/Chest: Effort normal and breath sounds normal. Right breast exhibits no inverted nipple, no mass, no nipple discharge, no skin change and no tenderness. Left breast exhibits no inverted nipple, no mass, no nipple discharge, no skin change and no tenderness.    3 x 6 cm area of rash below incision right breast  Lymphadenopathy:    She has no cervical adenopathy.    She has no axillary adenopathy.  Neurological: She is alert and oriented to person, place, and time.  Skin: Skin is warm and dry.  Psychiatric: Her behavior is normal.    Data Reviewed Ultrasound  examination of the right breast in the area of her previous scar and in the area of the present dermal thickening showed evidence of a suspected fluid collection just below the dermis at the 9:00 position, 3 cm from the nipple. This measured 0.6 x 1.6 x 2.1 cm. The patient was amenable to aspiration and this was completed using a total of 3 mL of 1% Xylocaine with 1 100,000 of epinephrine. Making use of a 22-gauge needle the area was aspirated with return of approximately 0.5 mL of very thick, white fluid. Slides 4 were prepared for cytology with a significant diminution in diameter post aspiration. BI-RADS-3.  The area of the  dermal change below the wide excision site showed thickening of the dermis up to 0.34 cm. This compared with the uninvolved area at the 12:00 position measuring 0.2 cm. The patient was amenable to obtaining a punch biopsy. Making use of a 2.5 mm punch biopsy device a single sample was obtained in the central area of the inflammatory changes. The skin defect was closed with a single 4-0 Prolene suture. Telfa and Tegaderm dressing applied. The procedure was well tolerated.  Assessment    The dermal changes are quite troubling considering their late onset.    Plan    While this could represent inflammatory changes from the underlying fluid pocket, considering the benign appearance of this fluid I'm more concerned the possibility of recurrent cancer. She had undergone neoadjuvant chemotherapy with significant downstaging. There is no evidence of axillary recurrence. The patient's CA 27-29 remains normal, less than 11, unchanged from 2 years ago.    The patient will be contacted when cytology and pathology results are available. Ice is been encouraged for comfort postbiopsy.  Suture removal to be completed in one week with the nursing staff.   HPI, Physical Exam, Assessment and Plan have been scribed under the direction and in the presence of Robert Bellow, MD.  Karie Fetch, RN  I have completed the exam and reviewed the above documentation for accuracy and completeness.  I agree with the above.  Haematologist has been used and any errors in dictation or transcription are unintentional.  Hervey Ard, M.D., F.A.C.S.  Robert Bellow 09/03/2016, 8:39 PM

## 2016-09-03 NOTE — Patient Instructions (Addendum)
The patient is aware to call back for any questions or concerns. May shower May remove dressing in 2-3 days Return for suture remoival May use an Ice pack as needed for comfort

## 2016-09-04 ENCOUNTER — Encounter: Payer: Self-pay | Admitting: Obstetrics and Gynecology

## 2016-09-05 ENCOUNTER — Ambulatory Visit: Payer: BLUE CROSS/BLUE SHIELD | Admitting: General Surgery

## 2016-09-06 ENCOUNTER — Telehealth: Payer: Self-pay | Admitting: Internal Medicine

## 2016-09-06 ENCOUNTER — Telehealth: Payer: Self-pay | Admitting: General Surgery

## 2016-09-06 DIAGNOSIS — Z171 Estrogen receptor negative status [ER-]: Principal | ICD-10-CM

## 2016-09-06 DIAGNOSIS — C50511 Malignant neoplasm of lower-outer quadrant of right female breast: Secondary | ICD-10-CM

## 2016-09-06 NOTE — Telephone Encounter (Signed)
He patient was notified that the skin biopsy completed on 09/03/2016 showed evidence of recurrent breast cancer. This information had been forwarded as well to her medical oncologist. Dr. Rogue Bussing will make arrangements for a PET scan and after that study is completed we'll decide whether the patient proceeds to mastectomy or adjuvant chemotherapy.  The patient reports that she tolerated the biopsy well. If PET scan is scheduled for next week she can come into the office to same day to have the suture removal from the biopsy site.

## 2016-09-06 NOTE — Telephone Encounter (Signed)
Spoke to pt re: path/plan PET scan ASAP [if issues with insurance will plan CT first]; discussed with pt. She is agreement. Pt needs follow up with me a day after the PET scan; no labs.

## 2016-09-08 NOTE — Telephone Encounter (Signed)
Thank you for the update. Sorry to hear this news. :-(

## 2016-09-10 ENCOUNTER — Ambulatory Visit: Payer: BLUE CROSS/BLUE SHIELD

## 2016-09-11 ENCOUNTER — Telehealth: Payer: Self-pay | Admitting: *Deleted

## 2016-09-11 DIAGNOSIS — Z01812 Encounter for preprocedural laboratory examination: Secondary | ICD-10-CM

## 2016-09-11 NOTE — Telephone Encounter (Signed)
LMP 03/2016 needs urine pregnancy test done pre PET

## 2016-09-11 NOTE — Telephone Encounter (Signed)
PET scan scheduled for 5/24 and see Dr Rogue Bussing on 5/25

## 2016-09-12 ENCOUNTER — Other Ambulatory Visit
Admission: RE | Admit: 2016-09-12 | Discharge: 2016-09-12 | Disposition: A | Discharge status: Home / Self Care | Payer: BLUE CROSS/BLUE SHIELD | Source: Ambulatory Visit | Attending: Internal Medicine | Admitting: Internal Medicine

## 2016-09-12 ENCOUNTER — Ambulatory Visit (INDEPENDENT_AMBULATORY_CARE_PROVIDER_SITE_OTHER): Payer: BLUE CROSS/BLUE SHIELD | Admitting: *Deleted

## 2016-09-12 ENCOUNTER — Ambulatory Visit
Admission: RE | Admit: 2016-09-12 | Discharge: 2016-09-12 | Disposition: A | Discharge status: Home / Self Care | Payer: BLUE CROSS/BLUE SHIELD | Source: Ambulatory Visit | Attending: Internal Medicine | Admitting: Internal Medicine

## 2016-09-12 DIAGNOSIS — Z923 Personal history of irradiation: Secondary | ICD-10-CM | POA: Diagnosis not present

## 2016-09-12 DIAGNOSIS — Z171 Estrogen receptor negative status [ER-]: Secondary | ICD-10-CM | POA: Insufficient documentation

## 2016-09-12 DIAGNOSIS — N6459 Other signs and symptoms in breast: Secondary | ICD-10-CM | POA: Diagnosis not present

## 2016-09-12 DIAGNOSIS — R591 Generalized enlarged lymph nodes: Secondary | ICD-10-CM | POA: Diagnosis not present

## 2016-09-12 DIAGNOSIS — C50011 Malignant neoplasm of nipple and areola, right female breast: Secondary | ICD-10-CM

## 2016-09-12 DIAGNOSIS — C50511 Malignant neoplasm of lower-outer quadrant of right female breast: Secondary | ICD-10-CM | POA: Diagnosis not present

## 2016-09-12 DIAGNOSIS — Z01812 Encounter for preprocedural laboratory examination: Secondary | ICD-10-CM

## 2016-09-12 LAB — GLUCOSE, CAPILLARY: GLUCOSE-CAPILLARY: 72 mg/dL (ref 65–99)

## 2016-09-12 LAB — PREGNANCY, URINE: PREG TEST UR: NEGATIVE

## 2016-09-12 MED ORDER — FLUDEOXYGLUCOSE F - 18 (FDG) INJECTION
12.0000 | Freq: Once | INTRAVENOUS | Status: AC | PRN
  Administered 2016-09-12: 12.51 via INTRAVENOUS

## 2016-09-12 NOTE — Patient Instructions (Signed)
The patient is aware to call back for any questions or concerns.  

## 2016-09-12 NOTE — Progress Notes (Signed)
Patient ID: Kiara Grant, female   DOB: Nov 26, 1970, 46 y.o.   MRN: [id number]  Patient came in today for a wound check.  The wound is clean, with no signs of infection noted area remains red in color. Follow up as scheduled.  The one suture was removed, band aid applied.

## 2016-09-13 ENCOUNTER — Inpatient Hospital Stay: Payer: BLUE CROSS/BLUE SHIELD | Attending: Internal Medicine | Admitting: Internal Medicine

## 2016-09-13 ENCOUNTER — Telehealth: Payer: Self-pay | Admitting: Internal Medicine

## 2016-09-13 ENCOUNTER — Ambulatory Visit
Admission: RE | Admit: 2016-09-13 | Discharge: 2016-09-13 | Disposition: A | Discharge status: Home / Self Care | Payer: BLUE CROSS/BLUE SHIELD | Source: Ambulatory Visit | Attending: Internal Medicine | Admitting: Internal Medicine

## 2016-09-13 VITALS — BP 99/69 | HR 75 | Temp 96.1°F | Wt 116.0 lb

## 2016-09-13 DIAGNOSIS — Z5111 Encounter for antineoplastic chemotherapy: Secondary | ICD-10-CM | POA: Diagnosis not present

## 2016-09-13 DIAGNOSIS — Z923 Personal history of irradiation: Secondary | ICD-10-CM | POA: Diagnosis not present

## 2016-09-13 DIAGNOSIS — R590 Localized enlarged lymph nodes: Secondary | ICD-10-CM | POA: Diagnosis not present

## 2016-09-13 DIAGNOSIS — Z79899 Other long term (current) drug therapy: Secondary | ICD-10-CM | POA: Diagnosis not present

## 2016-09-13 DIAGNOSIS — Z171 Estrogen receptor negative status [ER-]: Secondary | ICD-10-CM | POA: Diagnosis not present

## 2016-09-13 DIAGNOSIS — R519 Headache, unspecified: Secondary | ICD-10-CM

## 2016-09-13 DIAGNOSIS — K219 Gastro-esophageal reflux disease without esophagitis: Secondary | ICD-10-CM | POA: Diagnosis not present

## 2016-09-13 DIAGNOSIS — C50511 Malignant neoplasm of lower-outer quadrant of right female breast: Secondary | ICD-10-CM | POA: Insufficient documentation

## 2016-09-13 DIAGNOSIS — R51 Headache: Secondary | ICD-10-CM

## 2016-09-13 DIAGNOSIS — Z7189 Other specified counseling: Secondary | ICD-10-CM

## 2016-09-13 MED ORDER — LIDOCAINE-PRILOCAINE 2.5-2.5 % EX CREA: 1.0000 "application " | TOPICAL_CREAM | CUTANEOUS | 0 refills | Status: DC | PRN

## 2016-09-13 MED ORDER — ONDANSETRON HCL 8 MG PO TABS: ORAL_TABLET | ORAL | 1 refills | Status: DC

## 2016-09-13 MED ORDER — GADOBENATE DIMEGLUMINE 529 MG/ML IV SOLN
10.0000 mL | Freq: Once | INTRAVENOUS | Status: AC | PRN
  Administered 2016-09-13: 10 mL via INTRAVENOUS

## 2016-09-13 MED ORDER — PROCHLORPERAZINE MALEATE 10 MG PO TABS: 10.0000 mg | ORAL_TABLET | Freq: Four times a day (QID) | ORAL | 1 refills | Status: DC | PRN

## 2016-09-13 NOTE — Telephone Encounter (Signed)
x

## 2016-09-13 NOTE — Progress Notes (Signed)
START ON PATHWAY REGIMEN - Breast     A cycle is every 28 days (3 weeks on and 1 week off):     Paclitaxel   **Always confirm dose/schedule in your pharmacy ordering system**    Patient Characteristics: Metastatic Chemotherapy, HER2 Negative/Unknown/Equivocal, ER -/Unknown, First Line Therapeutic Status: Distant Metastases BRCA Mutation Status: Absent ER Status: Negative (-) HER2 Status: Negative (-) Would you be surprised if this patient died  in the next year? I would be surprised if this patient died in the next year PR Status: Negative (-) Line of therapy: First Line  Intent of Therapy: Non-Curative / Palliative Intent, Discussed with Patient 

## 2016-09-13 NOTE — Progress Notes (Signed)
[place] OFFICE PROGRESS NOTE  Patient Care Team: Copland, [name], MD (General Surgery) Copland, Ginette Otto as Referring Physician (Physician Assistant) [name], MD (Oncology)  Cancer Staging No matching staging information was found for the patient.   Oncology History   # DEC 2015-  Carcinoma of right breast T1 be N1 M0 tumor stage II diagnosis in December of 2015 (right lower and outer quadrant). Needle biopsy positive of the right, Axillary lymph node (December, 2015), positive for metastatic breast carcinoma. Estrogen receptor negative.  Progesterone receptor weakly positive.  HER-2/neu receptor negative. # JAN 2016- NEO-ADJ CHEMO- Cytoxan, Adriamycin,  followed by Taxol (neoadjuvant therapy) April 25, 2014., 3.finished 4 cycles of chemotherapy with Cytoxan and Adriamycin on 7 th  March, 2016 4.started Taxol on a weekly basis from March 29 5.  Patient will finish weekly Taxol on 13th of June, 2016 6.  Patient had lumpectomy and axillary node evaluation. ypT1b [75m]  ypN1 c (3 /12LN)MO [Dr.Byrnett].  (July, 2016) s/p RT.   7.  Genetic mutation.  No clinically significant mutation identified (December, 2015)  # MELANOMA left neck [Dr.Graham- 2011]     Carcinoma of lower-outer quadrant of right breast in female, estrogen receptor negative ([place]   01/30/2015 Initial Diagnosis    Breast cancer, female, right       INTERVAL HISTORY:  Kiara WEESNER46y.o.  female pleasant patient above history of Stage II ER negative PR-weakly positive; HER-2/neu negative breast cancer is here for follow-up.  In the interim patient noted to have erythema swelling and tenderness of the right breast shows progressive over the last few weeks. This was further evaluated by Dr. BCharissa Bash punch biopsy of the skin lesion showed poorly differentiated carcinoma suggestive of recurrent breast cancer. ER/PR  HER-2/neu status is pending. Patient also had a PET scan/ she is here to review the results/further workup. Patient notes to have the erythema rash is progressing  Patient does complain of intermittent headaches. Denies any nausea vomiting. Denies any bone pain. She is currently accompanied by her husband.  REVIEW OF SYSTEMS:  A complete 10 point review of system is done which is negative except mentioned above/history of present illness.   PAST MEDICAL HISTORY :  Past Medical History:  Diagnosis Date  . BRCA negative 03/2014   [facility]  . [facility]   Right, T1b, N1 prYpT1b,N1a, ER neg, PR < 10 %, Her 2 neu not overexpressed.   . Cancer ([facility] 03-24-14   Right breast, microcalcifications. ER negative, PR less than 10%, HER-2/neu not amplified.  .Marland KitchenGERD (gastroesophageal reflux disease)   . Melanoma ([facility] Sept 2011   Left neck T1a, 0.35 mm. [facility]  .Marland KitchenPONV (postoperative nausea and vomiting)     PAST SURGICAL HISTORY :   Past Surgical History:  Procedure Laterality Date  . BREAST BIOPSY Right 03-24-14   +  . BREAST EXCISIONAL BIOPSY Right 10/28/2014   Lumpectomy +  . BREAST LUMPECTOMY WITH SENTINEL LYMPH NODE BIOPSY Right 10/28/2014   Procedure: right breast wide excision with sentinel node biopsy, mastoplasty, axillary dissection ;  Surgeon: JRobert Bellow MD;  Location: [facility] ORS;  Service: General;  Laterality: Right;  . MELANOMA EXCISION  Nov 2011   neck  [facility]    FAMILY HISTORY :   Family History  Problem Relation Age  of Onset  . Cancer Mother        lung ? mid 24    SOCIAL HISTORY:   Social History  Substance Use Topics  . Smoking status: Never Smoker  . Smokeless tobacco: Never Used  . Alcohol use 0.0 oz/week     Comment: 3/week    ALLERGIES:  is allergic to sulfa antibiotics.  MEDICATIONS:  Current Outpatient Prescriptions  Medication Sig Dispense Refill  .  lidocaine-prilocaine (EMLA) cream Apply 1 application topically as needed. Apply generously over the Mediport 45 minutes prior to chemotherapy. 30 g 0  . ondansetron (ZOFRAN) 8 MG tablet 1 pill every 8 hours as here for nausea vomiting 40 tablet 1  . prochlorperazine (COMPAZINE) 10 MG tablet Take 1 tablet (10 mg total) by mouth every 6 (six) hours as needed for nausea or vomiting. 40 tablet 1   No current facility-administered medications for this visit.    Facility-Administered Medications Ordered in Other Visits  Medication Dose Route Frequency Provider Last Rate Last Dose  . sodium chloride 0.9 % injection 10 mL  10 mL Intravenous PRN Evlyn Kanner, NP   10 mL at 11/08/14 1207    PHYSICAL EXAMINATION: ECOG PERFORMANCE STATUS: 0 - Asymptomatic  BP 99/69 (BP Location: Left Arm, Patient Position: Sitting)   Pulse 75   Temp (!) 96.1 F (35.6 C) (Tympanic)   Wt 116 lb (52.6 kg)   BMI 19.91 kg/m   Filed Weights   09/13/16 0850  Weight: 116 lb (52.6 kg)    GENERAL: Well-nourished well-developed; Alert, no distress and comfortable.   With her husband.  EYES: no pallor or icterus OROPHARYNX: no thrush or ulceration; good dentition  NECK: supple, no masses felt LYMPH:  no palpable lymphadenopathy in the cervical Or inguinal region. Positive for a centimeter sized lymph node in the left axilla. None on the right. LUNGS: clear to auscultation and  No wheeze or crackles HEART/CVS: regular rate & rhythm and no murmurs; No lower extremity edema ABDOMEN:abdomen soft, non-tender and normal bowel sounds Musculoskeletal:no cyanosis of digits and no clubbing  PSYCH: alert & oriented x 3 with fluent speech NEURO: no focal motor/sensory deficits SKIN:  Patient has raised papules over the right breast; no definite mass noted. Edematous; mild tenderness noted  # image- 09/13/2016.       LABORATORY DATA:  I have reviewed the data as listed    Component Value Date/Time   NA 137  08/13/2016 0903   NA 138 08/16/2014 1332   K 4.3 08/13/2016 0903   K 3.7 08/16/2014 1332   CL 101 08/13/2016 0903   CL 106 08/16/2014 1332   CO2 29 08/13/2016 0903   CO2 27 08/16/2014 1332   GLUCOSE 93 08/13/2016 0903   GLUCOSE 118 (H) 08/16/2014 1332   BUN 16 08/13/2016 0903   BUN 9 08/16/2014 1332   CREATININE 0.96 08/13/2016 0903   CREATININE 0.71 08/16/2014 1332   CALCIUM 9.5 08/13/2016 0903   CALCIUM 9.0 08/16/2014 1332   PROT 7.8 08/13/2016 0903   PROT 7.2 08/16/2014 1332   ALBUMIN 4.5 08/13/2016 0903   ALBUMIN 4.4 08/16/2014 1332   AST 23 08/13/2016 0903   AST 28 08/16/2014 1332   ALT 13 (L) 08/13/2016 0903   ALT 31 08/16/2014 1332   ALKPHOS 62 08/13/2016 0903   ALKPHOS 45 08/16/2014 1332   BILITOT 0.5 08/13/2016 0903   BILITOT 0.4 08/16/2014 1332   GFRNONAA >60 08/13/2016 0903   GFRNONAA >60 08/16/2014 1332  GFRAA >60 08/13/2016 0903   GFRAA >60 08/16/2014 1332    No results found for: SPEP, UPEP  Lab Results  Component Value Date   WBC 5.3 08/13/2016   NEUTROABS 3.7 08/13/2016   HGB 13.9 08/13/2016   HCT 41.3 08/13/2016   MCV 90.1 08/13/2016   PLT 166 08/13/2016      Chemistry      Component Value Date/Time   NA 137 08/13/2016 0903   NA 138 08/16/2014 1332   K 4.3 08/13/2016 0903   K 3.7 08/16/2014 1332   CL 101 08/13/2016 0903   CL 106 08/16/2014 1332   CO2 29 08/13/2016 0903   CO2 27 08/16/2014 1332   BUN 16 08/13/2016 0903   BUN 9 08/16/2014 1332   CREATININE 0.96 08/13/2016 0903   CREATININE 0.71 08/16/2014 1332      Component Value Date/Time   CALCIUM 9.5 08/13/2016 0903   CALCIUM 9.0 08/16/2014 1332   ALKPHOS 62 08/13/2016 0903   ALKPHOS 45 08/16/2014 1332   AST 23 08/13/2016 0903   AST 28 08/16/2014 1332   ALT 13 (L) 08/13/2016 0903   ALT 31 08/16/2014 1332   BILITOT 0.5 08/13/2016 0903   BILITOT 0.4 08/16/2014 1332       RADIOGRAPHIC STUDIES: I have personally reviewed the radiological images as listed and agreed with the  findings in the report. Mr Jeri Cos Wo Contrast  Result Date: 09/13/2016 CLINICAL DATA:  46 year old female with recurrent breast cancer and frontal headaches. EXAM: MRI HEAD WITHOUT AND WITH CONTRAST TECHNIQUE: Multiplanar, multiecho pulse sequences of the brain and surrounding structures were obtained without and with intravenous contrast. CONTRAST:  69m MULTIHANCE GADOBENATE DIMEGLUMINE 529 MG/ML IV SOLN COMPARISON:  PET-CT 09/12/2016.  Brain MRI 01/18/2011 FINDINGS: Brain: No midline shift, mass effect, or evidence of intracranial mass lesion. No abnormal enhancement identified. No dural thickening. No restricted diffusion to suggest acute infarction. No ventriculomegaly, extra-axial collection or acute intracranial hemorrhage. Cervicomedullary junction and pituitary are within normal limits. Stable gray and white matter signal throughout the brain. Occasional nonspecific small subcortical white matter T2 and FLAIR hyperintensity. No chronic cerebral blood products or cortical encephalomalacia. Vascular: Major intracranial vascular flow voids are stable. Dominant distal right vertebral artery. Skull and upper cervical spine: Negative visualized cervical spine and spinal cord. Visible bone marrow signal is normal. Sinuses/Orbits: Normal orbits soft tissues. Visualized paranasal sinuses and mastoids are stable and well pneumatized. Other: Visible internal auditory structures appear normal. Negative scalp soft tissues. IMPRESSION: No metastatic disease or acute intracranial abnormality. Electronically Signed   By: HGenevie AnnM.D.   On: 09/13/2016 11:30   Nm Pet Image Restag (ps) Skull Base To Thigh  Result Date: 09/12/2016 CLINICAL DATA:  Subsequent treatment strategy for stage II right breast carcinoma diagnosed December 2015 status post neoadjuvant chemotherapy with lumpectomy and axillary lymph node evaluation July 2016 and status post radiation therapy and adjuvant chemotherapy. Recurrent right breast  cancer diagnosed on skin biopsy performed 09/03/2016 for a persistent rash. EXAM: NUCLEAR MEDICINE PET SKULL BASE TO THIGH TECHNIQUE: 12.5 mCi F-18 FDG was injected intravenously. Full-ring PET imaging was performed from the skull base to thigh after the radiotracer. CT data was obtained and used for attenuation correction and anatomic localization. FASTING BLOOD GLUCOSE:  Value: 72 mg/dl COMPARISON:  Report from 04/12/2014 PET-CT (images not available). FINDINGS: NECK Hypermetabolic 0.8 cm left supraclavicular neck lymph node with max SUV 4.7 (series 3/ image 57). No additional hypermetabolic lymph nodes in the neck.  CHEST Nonenlarged hypermetabolic right axillary lymph nodes, largest 0.7 cm with max SUV 3.5 (series 3/ image 72). Hypermetabolic left axillary lymph nodes, largest a mildly enlarged 1.0 cm left axillary node with max SUV 5.3 (series 3/ image 73). Hypermetabolic nonenlarged 0.7 cm right retropectoral node with max SUV 7.4 (series 3/image 65). Nonenlarged mildly hypermetabolic 0.5 cm high left prevascular mediastinal node with max SUV 3.1 (series 3/image 70). No additional enlarged or hypermetabolic mediastinal or hilar lymph nodes. Asymmetric right breast skin thickening. Low level hypermetabolism throughout the right breast, most prominently posterior to the right nipple with max SUV 3.6. Sharply marginated consolidation in the apical right upper lobe with associated mild bronchiectasis, volume loss and distortion and low level metabolism (max SUV 3.0), compatible with postradiation change. No significant pulmonary nodules. No pneumothorax. No pleural effusions. ABDOMEN/PELVIS No abnormal hypermetabolic activity within the liver, pancreas, adrenal glands, or spleen. No hypermetabolic lymph nodes in the abdomen or pelvis. SKELETON No focal hypermetabolic activity to suggest skeletal metastasis. IMPRESSION: 1. Hypermetabolic bilateral axillary, right retropectoral, left supraclavicular and high left  mediastinal prevascular lymphadenopathy compatible with nodal metastatic disease. 2. Asymmetric right breast skin thickening. Low level hypermetabolism throughout the right breast. These are nonspecific findings that may be due to post treatment change or locally recurrent malignancy. 3. Postradiation changes in the apical right upper lung lobe. 4. Otherwise no extrathoracic or osseous hypermetabolic metastatic disease. Electronically Signed   By: Ilona Sorrel M.D.   On: 09/12/2016 15:59     ASSESSMENT & PLAN:  Carcinoma of lower-outer quadrant of right breast in female, estrogen receptor negative ([place]) # STAGE IV RECURRENT breast cancer- inflammatory changes right breast/ PET scan shows- bilateral axillary adenopathy; left supraclavicular adenopathy. Repeat ER/PR HER-2/neu status pending.  # I discussed the staging/ and the treatment options the patient has been detail. Given the spread to the lymph nodes- I would recommend starting systemic therapy as soon as possible. We will plan to get restaging PET scan after 3 cycles or so. Patient has significant response- then consider palliative mastectomy.   # Discussed the systemic therapy options- weekly Taxol-in combination with carboplatin every 3 weeks. Unfortunately the clinical trial Taxol vs Eribulin is closed to accrual. Patient agrees to contacting [facility] for other clinical trial options.  # Discussed the potential side effects including but not limited to-increasing fatigue, nausea vomiting, diarrhea, hair loss, sores in the mouth, increase risk of infection and also neuropathy.   # Plan to start chemotherapy as early as next week. Patient will need a port. Discussed with Dr. Charissa Bash.  # Persistent headaches-  MRI of the brain- reviewed negative for metastases.   # Also send the tumor for Foundation one testing. Ordered antiemetics; EMLA cream.  # Patient will start chemotherapy next week; Taxol only; add carboplatin with day #8 [as she is  awaiting a port in the week of June 4]. Patient will be contacted regarding chemotherapy schedule. She will need labs.    Orders Placed This Encounter  Procedures  . MR BRAIN W WO CONTRAST    Standing Status:   Future    Number of Occurrences:   1    Standing Expiration Date:   11/13/2017    Order Specific Question:   Reason for Exam (SYMPTOM  OR DIAGNOSIS REQUIRED)    Answer:   headaches; recurrent breast cnacer    Order Specific Question:   Preferred imaging location?    Answer:   [facility] (table limit-300lbs)    Order  Specific Question:   Does the patient have a pacemaker or implanted devices?    Answer:   No    Order Specific Question:   What is the patient's sedation requirement?    Answer:   No Sedation    Order Specific Question:   Call Results- Best Contact Number?    Answer:   [phone] Hold Patient   All questions were answered. The patient knows to call the clinic with any problems, questions or concerns.      Cammie Sickle, MD 09/13/2016 7:17 PM

## 2016-09-13 NOTE — Assessment & Plan Note (Addendum)
#  STAGE IV RECURRENT breast cancer- inflammatory changes right breast/ PET scan shows- bilateral axillary adenopathy; left supraclavicular adenopathy. Repeat ER/PR HER-2/neu status pending.  # I discussed the staging/ and the treatment options the patient has been detail. Given the spread to the lymph nodes- I would recommend starting systemic therapy as soon as possible. We will plan to get restaging PET scan after 3 cycles or so. Patient has significant response- then consider palliative mastectomy.   # Discussed the systemic therapy options- weekly Taxol-in combination with carboplatin every 3 weeks. Unfortunately the clinical trial Taxol vs Eribulin is closed to accrual. Patient agrees to contacting [facility] for other clinical trial options.  # Discussed the potential side effects including but not limited to-increasing fatigue, nausea vomiting, diarrhea, hair loss, sores in the mouth, increase risk of infection and also neuropathy.   # Plan to start chemotherapy as early as next week. Patient will need a port. Discussed with Dr. Charissa Bash.  # Persistent headaches-  MRI of the brain- reviewed negative for metastases.   # Also send the tumor for Foundation one testing. Ordered antiemetics; EMLA cream.  # Patient will start chemotherapy next week; Taxol only; add carboplatin with day #8 [as she is awaiting a port in the week of June 4]. Patient will be contacted regarding chemotherapy schedule. She will need labs.

## 2016-09-13 NOTE — Progress Notes (Signed)
Patient here today for follow up.   

## 2016-09-15 ENCOUNTER — Telehealth: Payer: Self-pay | Admitting: General Surgery

## 2016-09-15 NOTE — Telephone Encounter (Signed)
Reviewed medical oncology appointment from 2 days ago. I had discussed w/ Dr. Burlene Arnt.  Chemo first in light of contralateral nodal disease. Will require a new port. Will arrange for evaluation early in the week of June 4 to assess left subclavian site used last time and port placement later that week.

## 2016-09-16 ENCOUNTER — Telehealth: Payer: Self-pay | Admitting: Internal Medicine

## 2016-09-16 NOTE — Telephone Encounter (Signed)
Please inform pt that I have not heard from [facility] yet; re: second opinion; however I would like to proceed with treatment this week. Orders are in. Also inform pt that I have spoken to Dr.Byrnett- reocmmends against the mammogram at this time. Cancel the mammo as planned in early June.   Plan to start may 31 st- Taxol alone [as port planned in June 4th week; as per Dr.Byrnett]; then June 7th- carbo-taxol; June 14 th- taxol alone.   Cbc/cmp on may 31st; June 7th-cbc/bmp; June 14th cbc/cmp/X-MD on june 14th;   # see me June 28th /labs/cbc/cmp/carbo-taxol.   Check with me if any questions.

## 2016-09-17 ENCOUNTER — Ambulatory Visit: Payer: BLUE CROSS/BLUE SHIELD | Admitting: Internal Medicine

## 2016-09-17 ENCOUNTER — Other Ambulatory Visit: Payer: Self-pay | Admitting: *Deleted

## 2016-09-17 DIAGNOSIS — C50511 Malignant neoplasm of lower-outer quadrant of right female breast: Secondary | ICD-10-CM

## 2016-09-17 DIAGNOSIS — Z171 Estrogen receptor negative status [ER-]: Principal | ICD-10-CM

## 2016-09-17 NOTE — Telephone Encounter (Signed)
Patient is scheduled to follow up here on 09/23/16 at 4:00 pm with Dr Bary Castilla. She is will have her port placement on 09/27/16. Instructions will be reviewed when she is seen on 09/23/16.

## 2016-09-17 NOTE — Telephone Encounter (Signed)
Per appt schedule all labs/chemo tx/md appts have been made and labs ordered as requested

## 2016-09-18 ENCOUNTER — Other Ambulatory Visit: Payer: Self-pay | Admitting: Internal Medicine

## 2016-09-19 ENCOUNTER — Inpatient Hospital Stay: Payer: BLUE CROSS/BLUE SHIELD

## 2016-09-19 ENCOUNTER — Telehealth: Payer: Self-pay | Admitting: *Deleted

## 2016-09-19 VITALS — BP 101/69 | HR 82 | Temp 96.7°F | Resp 16

## 2016-09-19 DIAGNOSIS — C50511 Malignant neoplasm of lower-outer quadrant of right female breast: Secondary | ICD-10-CM

## 2016-09-19 DIAGNOSIS — Z171 Estrogen receptor negative status [ER-]: Principal | ICD-10-CM

## 2016-09-19 LAB — COMPREHENSIVE METABOLIC PANEL
ALT: 13 U/L — ABNORMAL LOW (ref 14–54)
AST: 20 U/L (ref 15–41)
Albumin: 4.8 g/dL (ref 3.5–5.0)
Alkaline Phosphatase: 57 U/L (ref 38–126)
Anion gap: 6 (ref 5–15)
BUN: 14 mg/dL (ref 6–20)
CHLORIDE: 102 mmol/L (ref 101–111)
CO2: 29 mmol/L (ref 22–32)
Calcium: 9.7 mg/dL (ref 8.9–10.3)
Creatinine, Ser: 0.93 mg/dL (ref 0.44–1.00)
GFR calc Af Amer: >60 mL/min (ref >60)
GFR calc non Af Amer: >60 mL/min (ref >60)
GLUCOSE: 98 mg/dL (ref 65–99)
POTASSIUM: 4.2 mmol/L (ref 3.5–5.1)
Sodium: 137 mmol/L (ref 135–145)
TOTAL PROTEIN: 7.8 g/dL (ref 6.5–8.1)
Total Bilirubin: 0.6 mg/dL (ref 0.3–1.2)

## 2016-09-19 LAB — CBC WITH DIFFERENTIAL/PLATELET
BASOS ABS: 0 10*3/uL (ref 0–0.1)
BASOS PCT: 1 %
EOS ABS: 0.1 10*3/uL (ref 0–0.7)
EOS PCT: 1 %
HCT: 40.1 % (ref 35.0–47.0)
Hemoglobin: 14 g/dL (ref 12.0–16.0)
Lymphocytes Relative: 18 %
Lymphs Abs: 1 10*3/uL (ref 1.0–3.6)
MCH: 31.1 pg (ref 26.0–34.0)
MCHC: 35 g/dL (ref 32.0–36.0)
MCV: 88.9 fL (ref 80.0–100.0)
MONO ABS: 0.4 10*3/uL (ref 0.2–0.9)
MONOS PCT: 7 %
Neutro Abs: 3.9 10*3/uL (ref 1.4–6.5)
Neutrophils Relative %: 73 %
PLATELETS: 197 10*3/uL (ref 150–440)
RBC: 4.52 MIL/uL (ref 3.80–5.20)
RDW: 12.3 % (ref 11.5–14.5)
WBC: 5.3 10*3/uL (ref 3.6–11.0)

## 2016-09-19 MED ORDER — SODIUM CHLORIDE 0.9 % IV SOLN
Freq: Once | INTRAVENOUS | Status: AC
  Administered 2016-09-19: 12:00:00 via INTRAVENOUS
  Filled 2016-09-19: qty 1000

## 2016-09-19 MED ORDER — DIPHENHYDRAMINE HCL 50 MG/ML IJ SOLN
50.0000 mg | Freq: Once | INTRAMUSCULAR | Status: AC
  Administered 2016-09-19: 50 mg via INTRAVENOUS
  Filled 2016-09-19: qty 1

## 2016-09-19 MED ORDER — SODIUM CHLORIDE 0.9 % IV SOLN: 10.0000 mg | Freq: Once | INTRAVENOUS | Status: DC

## 2016-09-19 MED ORDER — DEXAMETHASONE SODIUM PHOSPHATE 10 MG/ML IJ SOLN
10.0000 mg | Freq: Once | INTRAMUSCULAR | Status: AC
  Administered 2016-09-19: 10 mg via INTRAVENOUS
  Filled 2016-09-19: qty 1

## 2016-09-19 MED ORDER — FAMOTIDINE IN NACL 20-0.9 MG/50ML-% IV SOLN
20.0000 mg | Freq: Once | INTRAVENOUS | Status: AC
  Administered 2016-09-19: 20 mg via INTRAVENOUS
  Filled 2016-09-19: qty 50

## 2016-09-19 MED ORDER — PACLITAXEL CHEMO INJECTION 300 MG/50ML
80.0000 mg/m2 | Freq: Once | INTRAVENOUS | Status: AC
  Administered 2016-09-19: 126 mg via INTRAVENOUS
  Filled 2016-09-19: qty 21

## 2016-09-19 NOTE — Telephone Encounter (Signed)
Patient's surgery has been moved from 09-27-16 to 09-24-16.   This patient verbalizes understanding. She will come in on Monday, 09-23-16 for pre-op visit as scheduled.

## 2016-09-20 ENCOUNTER — Encounter
Admission: RE | Admit: 2016-09-20 | Discharge: 2016-09-20 | Disposition: A | Discharge status: Home / Self Care | Payer: BLUE CROSS/BLUE SHIELD | Source: Ambulatory Visit | Attending: General Surgery | Admitting: General Surgery

## 2016-09-20 ENCOUNTER — Other Ambulatory Visit: Payer: Self-pay | Admitting: General Surgery

## 2016-09-20 DIAGNOSIS — C50911 Malignant neoplasm of unspecified site of right female breast: Secondary | ICD-10-CM

## 2016-09-20 DIAGNOSIS — Z171 Estrogen receptor negative status [ER-]: Principal | ICD-10-CM

## 2016-09-20 NOTE — Patient Instructions (Signed)
  Your procedure is scheduled on: 09-24-16 Report to Same Day Surgery 2nd floor medical mall [facility] Entrance-take elevator on left to 2nd floor.  Check in with surgery information desk.) To find out your arrival time please call [phone] between 1PM - 3PM on 09-23-16  Remember: Instructions that are not followed completely may result in serious medical risk, up to and including death, or upon the discretion of your surgeon and anesthesiologist your surgery may need to be rescheduled.    _x___ 1. Do not eat food or drink liquids after midnight. No gum chewing or                              hard candies.     __x__ 2. No Alcohol for 24 hours before or after surgery.   __x__3. No Smoking for 24 prior to surgery.   ____  4. Bring all medications with you on the day of surgery if instructed.    __x__ 5. Notify your doctor if there is any change in your medical condition     (cold, fever, infections).     Do not wear jewelry, make-up, hairpins, clips or nail polish.  Do not wear lotions, powders, or perfumes. You may wear deodorant.  Do not shave 48 hours prior to surgery. Men may shave face and neck.  Do not bring valuables to the hospital.    [facility] is not responsible for any belongings or valuables.               Contacts, dentures or bridgework may not be worn into surgery.  Leave your suitcase in the car. After surgery it may be brought to your room.  For patients admitted to the hospital, discharge time is determined by your                       treatment team.   Patients discharged the day of surgery will not be allowed to drive home.  You will need someone to drive you home and stay with you the night of your procedure.    Please read over the following fact sheets that you were given:   [facility] Preparing for Surgery and or MRSA Information   ____ Take anti-hypertensive (unless it includes a diuretic), cardiac, seizure, asthma,     anti-reflux and psychiatric  medicines. These include:  1. NONE  2.  3.  4.  5.  6.  ____Fleets enema or Magnesium Citrate as directed.   ____ Use CHG Soap or sage wipes as directed on instruction sheet   ____ Use inhalers on the day of surgery and bring to hospital day of surgery  ____ Stop Metformin and Janumet 2 days prior to surgery.    ____ Take 1/2 of usual insulin dose the night before surgery and none on the morning surgery.   ____ Follow recommendations from Cardiologist, Pulmonologist or PCP regarding  stopping Aspirin, Coumadin, Pllavix ,Eliquis, Effient, or Pradaxa, and Pletal.  ____Stop Anti-inflammatories such as Advil, Aleve, Ibuprofen, Motrin, Naproxen, Naprosyn, Goodies powders or aspirin products. OK to take Tylenol    ____ Stop supplements until after surgery.    ____ Bring C-Pap to the hospital.

## 2016-09-23 ENCOUNTER — Inpatient Hospital Stay (INDEPENDENT_AMBULATORY_CARE_PROVIDER_SITE_OTHER): Payer: BLUE CROSS/BLUE SHIELD

## 2016-09-23 ENCOUNTER — Encounter: Payer: Self-pay | Admitting: General Surgery

## 2016-09-23 ENCOUNTER — Other Ambulatory Visit: Payer: BLUE CROSS/BLUE SHIELD

## 2016-09-23 ENCOUNTER — Ambulatory Visit: Payer: BLUE CROSS/BLUE SHIELD | Admitting: General Surgery

## 2016-09-23 VITALS — BP 118/70 | HR 74 | Resp 12 | Ht 64.0 in | Wt 110.0 lb

## 2016-09-23 DIAGNOSIS — C50011 Malignant neoplasm of nipple and areola, right female breast: Secondary | ICD-10-CM

## 2016-09-23 DIAGNOSIS — Z171 Estrogen receptor negative status [ER-]: Principal | ICD-10-CM

## 2016-09-23 NOTE — Patient Instructions (Signed)
Port placement is scheduled for tomorrow

## 2016-09-23 NOTE — Progress Notes (Signed)
Patient ID: Kiara Grant, female   DOB: [birth year], 46 y.o.   MRN: [id number]  Chief Complaint  Patient presents with  . Pre-op Exam    port placement    HPI Kiara Grant is a 46 y.o. female here today for her pre op port placement. The case has been discussed in detail with medical oncology. Plans are for chemotherapy prior to consideration of surgical excision.  HPI  Past Medical History:  Diagnosis Date  . BRCA negative 03/2014   Testing completed at [facility]  . Breast cancer ([place]) 03/2014   Right, T1b, N1 prYpT1b,N1a, ER neg, PR < 10 %, Her 2 neu not overexpressed.   . Cancer [facility]) 03-24-14   Right breast, microcalcifications. ER negative, PR less than 10%, HER-2/neu not amplified.  . Melanoma [facility]) Sept 2011   Left neck T1a, 0.35 mm. Treated by Mohs injury [facility].  Marland Kitchen PONV (postoperative nausea and vomiting)    PORT PLACEMENT ONLY    Past Surgical History:  Procedure Laterality Date  . BREAST BIOPSY Right 03-24-14   +  . BREAST EXCISIONAL BIOPSY Right 10/28/2014   Lumpectomy +  . BREAST LUMPECTOMY WITH SENTINEL LYMPH NODE BIOPSY Right 10/28/2014   Procedure: right breast wide excision with sentinel node biopsy, mastoplasty, axillary dissection ;  Surgeon: Robert Bellow, MD;  Location: [facility] ORS;  Service: General;  Laterality: Right;  . MELANOMA EXCISION  Nov 2011   neck  . PORTACATH PLACEMENT  Dec 2015    Family History  Problem Relation Age of Onset  . Cancer Mother        lung ? mid 16    Social History Social History  Substance Use Topics  . Smoking status: Never Smoker  . Smokeless tobacco: Never Used  . Alcohol use No    Allergies  Allergen Reactions  . Sulfa Antibiotics Nausea Only    No current outpatient prescriptions on file.   No current facility-administered medications for this visit.    Facility-Administered Medications Ordered in Other Visits  Medication Dose Route Frequency Provider Last Rate Last Dose  . sodium  chloride 0.9 % injection 10 mL  10 mL Intravenous PRN Evlyn Kanner, NP   10 mL at 11/08/14 1207    Review of Systems Review of Systems  Constitutional: Negative.   Respiratory: Negative.   Cardiovascular: Negative.     Blood pressure 118/70, pulse 74, resp. rate 12, height 5' 4"  (1.626 m), weight 110 lb (49.9 kg).  Physical Exam Physical Exam  Constitutional: She is oriented to person, place, and time. She appears well-developed and well-nourished.  Cardiovascular: Normal rate, regular rhythm and normal heart sounds.   Pulmonary/Chest: Effort normal and breath sounds normal.    Neurological: She is alert and oriented to person, place, and time.  Skin: Skin is warm and dry.    Data Reviewed Ultrasound examination was undertaken of the left subclavian vessel determine if it was still patent after her prior port placement 2 years ago. The vein is found to be patent with respiratory variation noted on color flow imaging.   Assessment    Recurrent breast cancer.    Plan    We'll plan to reexamine the patient after her second cycle of chemotherapy.  Depending upon her response, we'll need to consider whether she would benefit from salvage mastectomy. She will night likely require rotation flap coverage based on the large volume of skin presently involved with disease. Will hold plastic surgery consultation  until her next visit.    Port placement is scheduled for tomorrow.  The risks associated with central venous access including arterial, pulmonary and venous injury were reviewed. The possible need for additional treatment if pulmonary injury occurs (chest tube placement) was discussed.  HPI, Physical Exam, Assessment and Plan have been scribed under the direction and in the presence of Hervey Ard, MD.  I have completed the exam and reviewed the above documentation for accuracy and completeness.  I agree with the above.  Haematologist has been used and any errors  in dictation or transcription are unintentional.  Hervey Ard, M.D., F.A.C.S.  Gaspar Cola, CMA    Robert Bellow 09/23/2016, 9:01 PM

## 2016-09-24 ENCOUNTER — Ambulatory Visit: Payer: BLUE CROSS/BLUE SHIELD | Admitting: Anesthesiology

## 2016-09-24 ENCOUNTER — Encounter
Admission: RE | Disposition: A | Discharge status: Home / Self Care | Payer: Self-pay | Source: Ambulatory Visit | Attending: General Surgery

## 2016-09-24 ENCOUNTER — Ambulatory Visit: Payer: BLUE CROSS/BLUE SHIELD

## 2016-09-24 ENCOUNTER — Encounter: Payer: Self-pay | Admitting: *Deleted

## 2016-09-24 ENCOUNTER — Other Ambulatory Visit: Payer: BLUE CROSS/BLUE SHIELD

## 2016-09-24 ENCOUNTER — Ambulatory Visit
Admission: RE | Admit: 2016-09-24 | Discharge: 2016-09-24 | Disposition: A | Discharge status: Home / Self Care | Payer: BLUE CROSS/BLUE SHIELD | Source: Ambulatory Visit | Attending: General Surgery | Admitting: General Surgery

## 2016-09-24 DIAGNOSIS — Z853 Personal history of malignant neoplasm of breast: Secondary | ICD-10-CM | POA: Diagnosis not present

## 2016-09-24 DIAGNOSIS — Z801 Family history of malignant neoplasm of trachea, bronchus and lung: Secondary | ICD-10-CM | POA: Diagnosis not present

## 2016-09-24 DIAGNOSIS — C50011 Malignant neoplasm of nipple and areola, right female breast: Secondary | ICD-10-CM

## 2016-09-24 DIAGNOSIS — Z171 Estrogen receptor negative status [ER-]: Secondary | ICD-10-CM | POA: Insufficient documentation

## 2016-09-24 DIAGNOSIS — Z882 Allergy status to sulfonamides status: Secondary | ICD-10-CM | POA: Diagnosis not present

## 2016-09-24 DIAGNOSIS — Z95828 Presence of other vascular implants and grafts: Secondary | ICD-10-CM

## 2016-09-24 DIAGNOSIS — Z85820 Personal history of malignant melanoma of skin: Secondary | ICD-10-CM | POA: Insufficient documentation

## 2016-09-24 DIAGNOSIS — C50911 Malignant neoplasm of unspecified site of right female breast: Secondary | ICD-10-CM

## 2016-09-24 HISTORY — PX: PORTACATH PLACEMENT: SHX2246

## 2016-09-24 SURGERY — INSERTION, TUNNELED CENTRAL VENOUS DEVICE, WITH PORT
Anesthesia: General | Wound class: Clean

## 2016-09-24 MED ORDER — CEFAZOLIN SODIUM-DEXTROSE 2-4 GM/100ML-% IV SOLN
INTRAVENOUS | Status: AC
  Filled 2016-09-24: qty 100

## 2016-09-24 MED ORDER — FENTANYL CITRATE (PF) 100 MCG/2ML IJ SOLN
INTRAMUSCULAR | Status: AC
  Filled 2016-09-24: qty 2

## 2016-09-24 MED ORDER — ONDANSETRON HCL 4 MG/2ML IJ SOLN
INTRAMUSCULAR | Status: AC
  Filled 2016-09-24: qty 2

## 2016-09-24 MED ORDER — ACETAMINOPHEN 10 MG/ML IV SOLN
INTRAVENOUS | Status: AC
Start: 2016-09-24 — End: 2016-09-24
  Filled 2016-09-24: qty 100

## 2016-09-24 MED ORDER — LACTATED RINGERS IV BOLUS (SEPSIS)
500.0000 mL | Freq: Once | INTRAVENOUS | Status: AC
  Administered 2016-09-24: 500 mL via INTRAVENOUS

## 2016-09-24 MED ORDER — ONDANSETRON HCL 4 MG/2ML IJ SOLN
INTRAMUSCULAR | Status: DC | PRN
  Administered 2016-09-24: 4 mg via INTRAVENOUS

## 2016-09-24 MED ORDER — LIDOCAINE HCL 1 % IJ SOLN
INTRAMUSCULAR | Status: DC | PRN
  Administered 2016-09-24 (×2): 10 mL

## 2016-09-24 MED ORDER — CEFAZOLIN SODIUM-DEXTROSE 2-4 GM/100ML-% IV SOLN
2.0000 g | INTRAVENOUS | Status: AC
  Administered 2016-09-24: 2 g via INTRAVENOUS

## 2016-09-24 MED ORDER — PROPOFOL 500 MG/50ML IV EMUL
INTRAVENOUS | Status: AC
  Filled 2016-09-24: qty 50

## 2016-09-24 MED ORDER — FAMOTIDINE 20 MG PO TABS
20.0000 mg | ORAL_TABLET | Freq: Once | ORAL | Status: AC
  Administered 2016-09-24: 20 mg via ORAL

## 2016-09-24 MED ORDER — DEXAMETHASONE SODIUM PHOSPHATE 10 MG/ML IJ SOLN
INTRAMUSCULAR | Status: AC
  Filled 2016-09-24: qty 1

## 2016-09-24 MED ORDER — DEXAMETHASONE SODIUM PHOSPHATE 10 MG/ML IJ SOLN
INTRAMUSCULAR | Status: DC | PRN
  Administered 2016-09-24: 5 mg via INTRAVENOUS

## 2016-09-24 MED ORDER — LIDOCAINE HCL (PF) 1 % IJ SOLN
INTRAMUSCULAR | Status: AC
  Filled 2016-09-24: qty 30

## 2016-09-24 MED ORDER — FAMOTIDINE 20 MG PO TABS
ORAL_TABLET | ORAL | Status: AC
  Filled 2016-09-24: qty 1

## 2016-09-24 MED ORDER — MIDAZOLAM HCL 2 MG/2ML IJ SOLN
INTRAMUSCULAR | Status: DC | PRN
  Administered 2016-09-24: 2 mg via INTRAVENOUS

## 2016-09-24 MED ORDER — FENTANYL CITRATE (PF) 100 MCG/2ML IJ SOLN: 25.0000 ug | INTRAMUSCULAR | Status: DC | PRN

## 2016-09-24 MED ORDER — PHENYLEPHRINE HCL 10 MG/ML IJ SOLN
INTRAMUSCULAR | Status: DC | PRN
  Administered 2016-09-24 (×3): 50 ug via INTRAVENOUS

## 2016-09-24 MED ORDER — HYDROCODONE-ACETAMINOPHEN 5-325 MG PO TABS: 1.0000 | ORAL_TABLET | ORAL | 0 refills | Status: DC | PRN

## 2016-09-24 MED ORDER — SODIUM CHLORIDE 0.9 % IJ SOLN
INTRAMUSCULAR | Status: AC
  Filled 2016-09-24: qty 50

## 2016-09-24 MED ORDER — KETOROLAC TROMETHAMINE 30 MG/ML IJ SOLN
INTRAMUSCULAR | Status: DC | PRN
  Administered 2016-09-24: 30 mg via INTRAVENOUS

## 2016-09-24 MED ORDER — ONDANSETRON HCL 4 MG/2ML IJ SOLN: 4.0000 mg | Freq: Once | INTRAMUSCULAR | Status: DC | PRN

## 2016-09-24 MED ORDER — MIDAZOLAM HCL 2 MG/2ML IJ SOLN
INTRAMUSCULAR | Status: AC
  Filled 2016-09-24: qty 2

## 2016-09-24 MED ORDER — LIDOCAINE HCL (PF) 2 % IJ SOLN
INTRAMUSCULAR | Status: AC
  Filled 2016-09-24: qty 2

## 2016-09-24 MED ORDER — PHENYLEPHRINE HCL 10 MG/ML IJ SOLN
INTRAMUSCULAR | Status: AC
  Filled 2016-09-24: qty 1

## 2016-09-24 MED ORDER — PROPOFOL 500 MG/50ML IV EMUL
INTRAVENOUS | Status: DC | PRN
  Administered 2016-09-24: 100 ug/kg/min via INTRAVENOUS

## 2016-09-24 MED ORDER — PROPOFOL 10 MG/ML IV BOLUS
INTRAVENOUS | Status: DC | PRN
  Administered 2016-09-24 (×2): 20 mg via INTRAVENOUS

## 2016-09-24 MED ORDER — LACTATED RINGERS IV SOLN
INTRAVENOUS | Status: DC
  Administered 2016-09-24: 11:00:00 via INTRAVENOUS

## 2016-09-24 MED ORDER — LIDOCAINE HCL (CARDIAC) 20 MG/ML IV SOLN
INTRAVENOUS | Status: DC | PRN
  Administered 2016-09-24: 60 mg via INTRAVENOUS

## 2016-09-24 MED ORDER — ACETAMINOPHEN 10 MG/ML IV SOLN
INTRAVENOUS | Status: DC | PRN
  Administered 2016-09-24: 1000 mg via INTRAVENOUS

## 2016-09-24 MED ORDER — KETOROLAC TROMETHAMINE 30 MG/ML IJ SOLN
INTRAMUSCULAR | Status: AC
  Filled 2016-09-24: qty 1

## 2016-09-24 SURGICAL SUPPLY — 29 items
BLADE SURG 15 STRL SS SAFETY (BLADE) ×3 IMPLANT
CHLORAPREP W/TINT 26ML (MISCELLANEOUS) ×3 IMPLANT
CLOSURE WOUND 1/2 X4 (GAUZE/BANDAGES/DRESSINGS) ×1
COVER LIGHT HANDLE STERIS (MISCELLANEOUS) ×6 IMPLANT
DECANTER SPIKE VIAL GLASS SM (MISCELLANEOUS) ×6 IMPLANT
DRAPE C-ARM XRAY 36X54 (DRAPES) ×3 IMPLANT
DRAPE LAPAROTOMY TRNSV 106X77 (MISCELLANEOUS) ×3 IMPLANT
DRSG TEGADERM 2-3/8X2-3/4 SM (GAUZE/BANDAGES/DRESSINGS) ×3 IMPLANT
DRSG TEGADERM 4X4.75 (GAUZE/BANDAGES/DRESSINGS) ×3 IMPLANT
DRSG TELFA 4X3 1S NADH ST (GAUZE/BANDAGES/DRESSINGS) ×3 IMPLANT
ELECT REM PT RETURN 9FT ADLT (ELECTROSURGICAL) ×3
ELECTRODE REM PT RTRN 9FT ADLT (ELECTROSURGICAL) ×1 IMPLANT
GLOVE BIO SURGEON STRL SZ7.5 (GLOVE) ×3 IMPLANT
GLOVE INDICATOR 8.0 STRL GRN (GLOVE) ×3 IMPLANT
GOWN STRL REUS W/ TWL LRG LVL3 (GOWN DISPOSABLE) ×2 IMPLANT
GOWN STRL REUS W/TWL LRG LVL3 (GOWN DISPOSABLE) ×4
KIT PORT POWER 8FR ISP CVUE (Catheter) ×3 IMPLANT
KIT RM TURNOVER STRD PROC AR (KITS) ×3 IMPLANT
LABEL OR SOLS (LABEL) ×3 IMPLANT
NS IRRIG 500ML POUR BTL (IV SOLUTION) ×3 IMPLANT
PACK PORT-A-CATH (MISCELLANEOUS) ×3 IMPLANT
STRIP CLOSURE SKIN 1/2X4 (GAUZE/BANDAGES/DRESSINGS) ×2 IMPLANT
SUT PROLENE 3 0 SH DA (SUTURE) ×3 IMPLANT
SUT VIC AB 3-0 SH 27 (SUTURE) ×2
SUT VIC AB 3-0 SH 27X BRD (SUTURE) ×1 IMPLANT
SUT VIC AB 4-0 FS2 27 (SUTURE) ×3 IMPLANT
SWABSTK COMLB BENZOIN TINCTURE (MISCELLANEOUS) ×3 IMPLANT
SYR 10ML SLIP (SYRINGE) ×3 IMPLANT
SYRINGE 10CC LL (SYRINGE) ×3 IMPLANT

## 2016-09-24 NOTE — Anesthesia Postprocedure Evaluation (Signed)
Anesthesia Post Note  Patient: Kiara Grant  Procedure(s) Performed: Procedure(s) (LRB): INSERTION PORT-A-CATH (N/A)  Patient location during evaluation: PACU Anesthesia Type: General Level of consciousness: awake and alert Pain management: pain level controlled Vital Signs Assessment: post-procedure vital signs reviewed and stable Respiratory status: spontaneous breathing and respiratory function stable Cardiovascular status: stable Anesthetic complications: no     Last Vitals:  Vitals:   09/24/16 1245 09/24/16 1248  BP: (!) 81/53 90/61  Pulse: 73 69  Resp: 16 15  Temp: 36.5 C     Last Pain:  Vitals:   09/24/16 1245  TempSrc:   PainSc: Asleep                 Izac Faulkenberry K

## 2016-09-24 NOTE — Transfer of Care (Signed)
Immediate Anesthesia Transfer of Care Note  Patient: Kiara Grant  Procedure(s) Performed: Procedure(s): INSERTION PORT-A-CATH (N/A)  Patient Location: PACU  Anesthesia Type:General  Level of Consciousness: sedated  Airway & Oxygen Therapy: Patient Spontanous Breathing and Patient connected to face mask oxygen  Post-op Assessment: Report given to RN and Post -op Vital signs reviewed and stable  Post vital signs: Reviewed and stable  Last Vitals:  Vitals:   09/24/16 1245 09/24/16 1248  BP: (!) 81/53 90/61  Pulse: 73 69  Resp: 16 15  Temp: 36.5 C     Last Pain:  Vitals:   09/24/16 1106  TempSrc: Oral  PainSc: 0-No pain         Complications: No apparent anesthesia complications

## 2016-09-24 NOTE — Anesthesia Post-op Follow-up Note (Cosign Needed)
Anesthesia QCDR form completed.        

## 2016-09-24 NOTE — Anesthesia Preprocedure Evaluation (Signed)
Anesthesia Evaluation  Patient identified by MRN, date of birth, ID band Patient awake    Reviewed: Allergy & Precautions, NPO status , Patient's Chart, lab work & pertinent test results  History of Anesthesia Complications (+) PONV and history of anesthetic complications  Airway Mallampati: II       Dental   Pulmonary neg pulmonary ROS,           Cardiovascular negative cardio ROS       Neuro/Psych negative neurological ROS     GI/Hepatic negative GI ROS, Neg liver ROS,   Endo/Other  negative endocrine ROS  Renal/GU negative Renal ROS     Musculoskeletal   Abdominal   Peds  Hematology   Anesthesia Other Findings   Reproductive/Obstetrics                             Anesthesia Physical Anesthesia Plan  ASA: II  Anesthesia Plan: General   Post-op Pain Management:    Induction: Intravenous  PONV Risk Score and Plan: 4 or greater and Ondansetron, Dexamethasone, Propofol, Midazolam, Scopolamine patch - Pre-op and Treatment may vary due to age  Airway Management Planned:   Additional Equipment:   Intra-op Plan:   Post-operative Plan:   Informed Consent: I have reviewed the patients History and Physical, chart, labs and discussed the procedure including the risks, benefits and alternatives for the proposed anesthesia with the patient or authorized representative who has indicated his/her understanding and acceptance.     Plan Discussed with:   Anesthesia Plan Comments:         Anesthesia Quick Evaluation

## 2016-09-24 NOTE — Progress Notes (Signed)
[facility] request faxed per md order. 09/24/16

## 2016-09-24 NOTE — Discharge Instructions (Signed)
AMBULATORY SURGERY  °DISCHARGE INSTRUCTIONS ° ° °1) The drugs that you were given will stay in your system until tomorrow so for the next 24 hours you should not: ° °A) Drive an automobile °B) Make any legal decisions °C) Drink any alcoholic beverage ° ° °2) You may resume regular meals tomorrow.  Today it is better to start with liquids and gradually work up to solid foods. ° °You may eat anything you prefer, but it is better to start with liquids, then soup and crackers, and gradually work up to solid foods. ° ° °3) Please notify your doctor immediately if you have any unusual bleeding, trouble breathing, redness and pain at the surgery site, drainage, fever, or pain not relieved by medication. ° °Please contact your physician with any problems or Same Day Surgery at [phone], Monday through Friday 6 am to 4 pm, or [place] at [place] Main number at [phone]. °

## 2016-09-24 NOTE — H&P (Signed)
No change from yesterday's exam.  For left power port placement.

## 2016-09-24 NOTE — Op Note (Signed)
Preoperative diagnosis: Recurrent right breast cancer. Need for central venous access.  Postoperative diagnosis: Same.  Operative procedure: Left subclavian PowerPort placement with ultrasound and fluoroscopic guidance.  Operating surgeon: Hervey Ard, M.D.  Anesthesia: Attended local, 20 cc 1% plain Xylocaine.  Estimated blood loss: Less than 5 cc.  Clinical note: This 46 year old woman has developed recurrent cancer and is beginning chemotherapy. Central venous access has been requested by her treating oncologist.  Operative note: With the patient under adequate sedation and Trendelenburg position and the left neck and chest was prepped with ChloraPrep and draped. Local anesthesia was infiltrated and well-tolerated. The left subclavian vein was cannulated under ultrasound guidance. The guidewire was passed followed by the dilator. The catheter was positioned at the junction of the SVC and right atrium. He was told to the old port site at the left anterior chest. The old scar was excised and discarded. A pocket was made and the port anchored to the deep tissue with interrupted 3-0 Prolene sutures. It easily irrigated and aspirated with the patient in the supine position. The wound was closed in layers with a running 3-0 Vicryl suture to the adipose layer and a running 4-0 Vicryl subcutaneous suture for the skin.  Benzoin, Steri-Strips, Telfa and Tegaderm dressings were applied.  Chest x-ray was pending at the time of dictation.

## 2016-09-25 LAB — POCT PREGNANCY, URINE: PREG TEST UR: NEGATIVE

## 2016-09-26 ENCOUNTER — Other Ambulatory Visit: Payer: BLUE CROSS/BLUE SHIELD

## 2016-09-26 ENCOUNTER — Inpatient Hospital Stay: Payer: BLUE CROSS/BLUE SHIELD

## 2016-09-26 ENCOUNTER — Other Ambulatory Visit: Payer: Self-pay | Admitting: Oncology

## 2016-09-26 ENCOUNTER — Inpatient Hospital Stay: Payer: BLUE CROSS/BLUE SHIELD | Attending: Internal Medicine

## 2016-09-26 ENCOUNTER — Encounter: Payer: Self-pay | Admitting: General Surgery

## 2016-09-26 VITALS — BP 107/76 | HR 88 | Temp 97.6°F | Resp 18

## 2016-09-26 DIAGNOSIS — Z171 Estrogen receptor negative status [ER-]: Secondary | ICD-10-CM | POA: Insufficient documentation

## 2016-09-26 DIAGNOSIS — R590 Localized enlarged lymph nodes: Secondary | ICD-10-CM | POA: Insufficient documentation

## 2016-09-26 DIAGNOSIS — C50511 Malignant neoplasm of lower-outer quadrant of right female breast: Secondary | ICD-10-CM | POA: Insufficient documentation

## 2016-09-26 DIAGNOSIS — Z5111 Encounter for antineoplastic chemotherapy: Secondary | ICD-10-CM | POA: Insufficient documentation

## 2016-09-26 DIAGNOSIS — Z79899 Other long term (current) drug therapy: Secondary | ICD-10-CM | POA: Diagnosis not present

## 2016-09-26 DIAGNOSIS — Z85820 Personal history of malignant melanoma of skin: Secondary | ICD-10-CM | POA: Insufficient documentation

## 2016-09-26 LAB — CBC WITH DIFFERENTIAL/PLATELET
BASOS ABS: 0.1 10*3/uL (ref 0–0.1)
Basophils Relative: 1 %
Eosinophils Absolute: 0 10*3/uL (ref 0–0.7)
Eosinophils Relative: 1 %
HEMATOCRIT: 37.2 % (ref 35.0–47.0)
Hemoglobin: 13.1 g/dL (ref 12.0–16.0)
LYMPHS ABS: 1.6 10*3/uL (ref 1.0–3.6)
LYMPHS PCT: 41 %
MCH: 31.3 pg (ref 26.0–34.0)
MCHC: 35.2 g/dL (ref 32.0–36.0)
MCV: 88.9 fL (ref 80.0–100.0)
MONO ABS: 0.3 10*3/uL (ref 0.2–0.9)
Monocytes Relative: 6 %
NEUTROS ABS: 2 10*3/uL (ref 1.4–6.5)
Neutrophils Relative %: 51 %
Platelets: 187 10*3/uL (ref 150–440)
RBC: 4.18 MIL/uL (ref 3.80–5.20)
RDW: 12.4 % (ref 11.5–14.5)
WBC: 4 10*3/uL (ref 3.6–11.0)

## 2016-09-26 LAB — COMPREHENSIVE METABOLIC PANEL
ALT: 13 U/L — AB (ref 14–54)
AST: 23 U/L (ref 15–41)
Albumin: 4.2 g/dL (ref 3.5–5.0)
Alkaline Phosphatase: 54 U/L (ref 38–126)
Anion gap: 7 (ref 5–15)
BUN: 16 mg/dL (ref 6–20)
CO2: 28 mmol/L (ref 22–32)
CREATININE: 0.86 mg/dL (ref 0.44–1.00)
Calcium: 9.4 mg/dL (ref 8.9–10.3)
Chloride: 102 mmol/L (ref 101–111)
Glucose, Bld: 100 mg/dL — ABNORMAL HIGH (ref 65–99)
Potassium: 3.6 mmol/L (ref 3.5–5.1)
Sodium: 137 mmol/L (ref 135–145)
Total Bilirubin: 0.5 mg/dL (ref 0.3–1.2)
Total Protein: 7.3 g/dL (ref 6.5–8.1)

## 2016-09-26 MED ORDER — DIPHENHYDRAMINE HCL 50 MG/ML IJ SOLN
50.0000 mg | Freq: Once | INTRAMUSCULAR | Status: AC
  Administered 2016-09-26: 50 mg via INTRAVENOUS
  Filled 2016-09-26: qty 1

## 2016-09-26 MED ORDER — SODIUM CHLORIDE 0.9 % IV SOLN: 10.0000 mg | Freq: Once | INTRAVENOUS | Status: DC

## 2016-09-26 MED ORDER — PALONOSETRON HCL INJECTION 0.25 MG/5ML
0.2500 mg | Freq: Once | INTRAVENOUS | Status: AC
  Administered 2016-09-26: 0.25 mg via INTRAVENOUS
  Filled 2016-09-26: qty 5

## 2016-09-26 MED ORDER — SODIUM CHLORIDE 0.9 % IV SOLN
Freq: Once | INTRAVENOUS | Status: AC
  Administered 2016-09-26: 09:00:00 via INTRAVENOUS
  Filled 2016-09-26: qty 1000

## 2016-09-26 MED ORDER — HEPARIN SOD (PORK) LOCK FLUSH 100 UNIT/ML IV SOLN
500.0000 [IU] | Freq: Once | INTRAVENOUS | Status: AC | PRN
  Administered 2016-09-26: 500 [IU]
  Filled 2016-09-26: qty 5

## 2016-09-26 MED ORDER — PACLITAXEL CHEMO INJECTION 300 MG/50ML
80.0000 mg/m2 | Freq: Once | INTRAVENOUS | Status: AC
  Administered 2016-09-26: 126 mg via INTRAVENOUS
  Filled 2016-09-26: qty 21

## 2016-09-26 MED ORDER — DEXAMETHASONE SODIUM PHOSPHATE 10 MG/ML IJ SOLN
10.0000 mg | Freq: Once | INTRAMUSCULAR | Status: AC
  Administered 2016-09-26: 10 mg via INTRAVENOUS
  Filled 2016-09-26: qty 1

## 2016-09-26 MED ORDER — FAMOTIDINE IN NACL 20-0.9 MG/50ML-% IV SOLN
20.0000 mg | Freq: Once | INTRAVENOUS | Status: AC
  Administered 2016-09-26: 20 mg via INTRAVENOUS
  Filled 2016-09-26: qty 50

## 2016-09-26 MED ORDER — SODIUM CHLORIDE 0.9 % IV SOLN
470.0000 mg | Freq: Once | INTRAVENOUS | Status: AC
  Administered 2016-09-26: 470 mg via INTRAVENOUS
  Filled 2016-09-26: qty 47

## 2016-09-27 ENCOUNTER — Encounter: Payer: Self-pay | Admitting: *Deleted

## 2016-09-27 NOTE — Progress Notes (Signed)
09/25/16- 1027 am - RN Received msg via fax- foundation one is working to obtain the specimen for [employer] testing.

## 2016-10-02 ENCOUNTER — Ambulatory Visit: Payer: BLUE CROSS/BLUE SHIELD | Admitting: General Surgery

## 2016-10-03 ENCOUNTER — Inpatient Hospital Stay (HOSPITAL_BASED_OUTPATIENT_CLINIC_OR_DEPARTMENT_OTHER): Payer: BLUE CROSS/BLUE SHIELD | Admitting: Hematology and Oncology

## 2016-10-03 ENCOUNTER — Inpatient Hospital Stay: Payer: BLUE CROSS/BLUE SHIELD

## 2016-10-03 ENCOUNTER — Other Ambulatory Visit: Payer: Self-pay | Admitting: Hematology and Oncology

## 2016-10-03 VITALS — BP 104/69 | HR 83 | Temp 97.7°F | Resp 18 | Wt 117.5 lb

## 2016-10-03 DIAGNOSIS — C50511 Malignant neoplasm of lower-outer quadrant of right female breast: Secondary | ICD-10-CM

## 2016-10-03 DIAGNOSIS — Z171 Estrogen receptor negative status [ER-]: Secondary | ICD-10-CM

## 2016-10-03 DIAGNOSIS — Z85820 Personal history of malignant melanoma of skin: Secondary | ICD-10-CM

## 2016-10-03 DIAGNOSIS — Z79899 Other long term (current) drug therapy: Secondary | ICD-10-CM

## 2016-10-03 DIAGNOSIS — R590 Localized enlarged lymph nodes: Secondary | ICD-10-CM | POA: Diagnosis not present

## 2016-10-03 DIAGNOSIS — Z5111 Encounter for antineoplastic chemotherapy: Secondary | ICD-10-CM | POA: Insufficient documentation

## 2016-10-03 LAB — COMPREHENSIVE METABOLIC PANEL
ALK PHOS: 55 U/L (ref 38–126)
ALT: 16 U/L (ref 14–54)
ANION GAP: 7 (ref 5–15)
AST: 25 U/L (ref 15–41)
Albumin: 3.9 g/dL (ref 3.5–5.0)
BILIRUBIN TOTAL: 0.5 mg/dL (ref 0.3–1.2)
BUN: 19 mg/dL (ref 6–20)
CALCIUM: 8.7 mg/dL — AB (ref 8.9–10.3)
CO2: 28 mmol/L (ref 22–32)
Chloride: 102 mmol/L (ref 101–111)
Creatinine, Ser: 0.77 mg/dL (ref 0.44–1.00)
GFR calc Af Amer: >60 mL/min (ref >60)
GFR calc non Af Amer: >60 mL/min (ref >60)
GLUCOSE: 93 mg/dL (ref 65–99)
Potassium: 4 mmol/L (ref 3.5–5.1)
Sodium: 137 mmol/L (ref 135–145)
TOTAL PROTEIN: 6.6 g/dL (ref 6.5–8.1)

## 2016-10-03 LAB — CBC WITH DIFFERENTIAL/PLATELET
BASOS PCT: 2 %
Basophils Absolute: 0 10*3/uL (ref 0–0.1)
Eosinophils Absolute: 0 10*3/uL (ref 0–0.7)
Eosinophils Relative: 2 %
HEMATOCRIT: 32.6 % — AB (ref 35.0–47.0)
HEMOGLOBIN: 11.5 g/dL — AB (ref 12.0–16.0)
LYMPHS ABS: 0.7 10*3/uL — AB (ref 1.0–3.6)
Lymphocytes Relative: 27 %
MCH: 31.5 pg (ref 26.0–34.0)
MCHC: 35.4 g/dL (ref 32.0–36.0)
MCV: 89 fL (ref 80.0–100.0)
MONOS PCT: 8 %
Monocytes Absolute: 0.2 10*3/uL (ref 0.2–0.9)
Neutro Abs: 1.6 10*3/uL (ref 1.4–6.5)
Neutrophils Relative %: 61 %
Platelets: 145 10*3/uL — ABNORMAL LOW (ref 150–440)
RBC: 3.66 MIL/uL — ABNORMAL LOW (ref 3.80–5.20)
RDW: 12.2 % (ref 11.5–14.5)
WBC: 2.5 10*3/uL — AB (ref 3.6–11.0)

## 2016-10-03 MED ORDER — HEPARIN SOD (PORK) LOCK FLUSH 100 UNIT/ML IV SOLN
500.0000 [IU] | Freq: Once | INTRAVENOUS | Status: AC
  Administered 2016-10-03: 500 [IU] via INTRAVENOUS
  Filled 2016-10-03: qty 5

## 2016-10-03 MED ORDER — SODIUM CHLORIDE 0.9 % IV SOLN
Freq: Once | INTRAVENOUS | Status: AC
  Administered 2016-10-03: 10:00:00 via INTRAVENOUS
  Filled 2016-10-03: qty 1000

## 2016-10-03 MED ORDER — FAMOTIDINE IN NACL 20-0.9 MG/50ML-% IV SOLN
20.0000 mg | Freq: Once | INTRAVENOUS | Status: AC
  Administered 2016-10-03: 20 mg via INTRAVENOUS

## 2016-10-03 MED ORDER — DEXAMETHASONE SODIUM PHOSPHATE 10 MG/ML IJ SOLN
10.0000 mg | Freq: Once | INTRAMUSCULAR | Status: AC
  Administered 2016-10-03: 10 mg via INTRAVENOUS

## 2016-10-03 MED ORDER — DIPHENHYDRAMINE HCL 50 MG/ML IJ SOLN
50.0000 mg | Freq: Once | INTRAMUSCULAR | Status: AC
  Administered 2016-10-03: 50 mg via INTRAVENOUS

## 2016-10-03 MED ORDER — SODIUM CHLORIDE 0.9% FLUSH
10.0000 mL | INTRAVENOUS | Status: DC | PRN
  Administered 2016-10-03: 10 mL via INTRAVENOUS
  Filled 2016-10-03: qty 10

## 2016-10-03 MED ORDER — SODIUM CHLORIDE 0.9 % IV SOLN: 10.0000 mg | Freq: Once | INTRAVENOUS | Status: DC

## 2016-10-03 MED ORDER — SODIUM CHLORIDE 0.9 % IV SOLN
80.0000 mg/m2 | Freq: Once | INTRAVENOUS | Status: AC
  Administered 2016-10-03: 126 mg via INTRAVENOUS
  Filled 2016-10-03: qty 21

## 2016-10-03 NOTE — Progress Notes (Signed)
Patient of Dr.B.  Patient offers no complaints today.

## 2016-10-03 NOTE — Progress Notes (Signed)
[place] OFFICE PROGRESS NOTE  Patient Care Team: Copland, [name], MD (General Surgery) Copland, Ginette Otto as Referring Physician (Physician Assistant) [name], MD (Oncology)  Cancer Staging No matching staging information was found for the patient.   Oncology History   # DEC 2015-  Carcinoma of right breast T1 be N1 M0 tumor stage II diagnosis in December of 2015 (right lower and outer quadrant). Needle biopsy positive of the right, Axillary lymph node (December, 2015), positive for metastatic breast carcinoma. Estrogen receptor negative.  Progesterone receptor weakly positive.  HER-2/neu receptor negative. # JAN 2016- NEO-ADJ CHEMO- Cytoxan, Adriamycin,  followed by Taxol (neoadjuvant therapy) April 25, 2014., 3.finished 4 cycles of chemotherapy with Cytoxan and Adriamycin on 7 th  March, 2016 4.started Taxol on a weekly basis from March 29 5.  Patient will finish weekly Taxol on 13th of June, 2016 6.  Patient had lumpectomy and axillary node evaluation. ypT1b [35m]  ypN1 c (3 /12LN)MO [Dr.Byrnett].  (July, 2016) s/p RT.   7.  Genetic mutation.  No clinically significant mutation identified (December, 2015)  # MELANOMA left neck [Dr.Graham- 2011]     Carcin[place] of lower-outer quadrant of right breast in [place], [place]   01/30/2015 Initial Diagnosis    Breast cancer, female, right       INTERVAL HISTORY:  Kiara [id number].  female pleasant patient above history of Stage II ER negative PR-weakly positive; HER-2/neu negative breast cancer is here for day 15 of cycle #1 carboplatin and Taxol.  Patient noted to have erythema swelling and tenderness of the right breast shows progressive over the last few weeks. This was further evaluated by Dr. BCharissa Bash punch biopsy of the skin lesion showed poorly differentiated carcinoma suggestive of recurrent breast  cancer. ER/PR HER-2/neu status is pending. Patient also had a PET scan/ she is here to review the results/further workup. Patient notes to have the erythema rash is progressing  The patient began cycle #1 carboplatinum and Taxol on 09/19/2016. She received Taxol on 05/31 and carboplatin + Taxol on 09/26/2016.  She is due for Taxol today and next week she has off.  Port-a-cath was placed on 09/24/2016.  Symptomatically she is doing well with the Taxol.  She feels "fine". Carboplatin causes nausea. She states she felt bad "Thursday through Monday".  She started Zofran on Friday. She notes the rash is still there.  REVIEW OF SYSTEMS:  A complete 10 point review of system is done which is negative except mentioned above/history of present illness.   PAST MEDICAL HISTORY :  Past Medical History:  Diagnosis Date  . BRCA negative 03/2014   [facility]  . [place] 03[place]   Right, T1b, N1 prYpT1b,N1a, ER neg, PR < 10 %, Her 2 neu not overexpressed.   . Cancer ([facility] 03-24-14   Right breast, microcalcifications. ER negative, PR less than 10%, HER-2/neu not amplified.  . Melanoma ([facility] Sept 2011   Left neck T1a, 0.35 mm. [facility]  .Marland KitchenPONV (postoperative nausea and vomiting)    PORT PLACEMENT ONLY    PAST SURGICAL HISTORY :   Past Surgical History:  Procedure Laterality Date  . BREAST BIOPSY Right 03-24-14   +  . BREAST EXCISIONAL BIOPSY Right 10/28/2014   Lumpectomy +  . BREAST LUMPECTOMY WITH SENTINEL LYMPH NODE BIOPSY Right 10/28/2014   Procedure: right  breast wide excision with sentinel node biopsy, mastoplasty, axillary dissection ;  Surgeon: Robert Bellow, MD;  Location: [facility] ORS;  Service: General;  Laterality: Right;  . MELANOMA EXCISION  Nov 2011   neck  . PORT-A-CATH REMOVAL  2017  . [facility] PLACEMENT  Dec 2015  . PORTACATH PLACEMENT N/A 09/24/2016   Procedure: INSERTION PORT-A-CATH;  Surgeon: Robert Bellow, MD;   Location: [facility] ORS;  Service: General;  Laterality: N/A;    FAMILY HISTORY :   Family History  Problem Relation Age of Onset  . Cancer Mother        lung ? mid 77    SOCIAL HISTORY:   Social History  Substance Use Topics  . Smoking status: Never Smoker  . Smokeless tobacco: Never Used  . Alcohol use No    ALLERGIES:  is allergic to sulfa antibiotics.  MEDICATIONS:  Current Outpatient Prescriptions  Medication Sig Dispense Refill  . Misc Natural Products (DANDELION ROOT PO) Take by mouth.    . ondansetron (ZOFRAN) 8 MG tablet Take 8 mg by mouth every 8 (eight) hours as needed for nausea or vomiting.    . TURMERIC PO Take 1,000 mg by mouth.     No current facility-administered medications for this visit.    Facility-Administered Medications Ordered in Other Visits  Medication Dose Route Frequency Provider Last Rate Last Dose  . heparin lock flush 100 unit/mL  500 Units Intravenous Once Charlaine Dalton R, MD      . sodium chloride 0.9 % injection 10 mL  10 mL Intravenous PRN Evlyn Kanner, NP   10 mL at 11/08/14 1207  . sodium chloride flush (NS) 0.9 % injection 10 mL  10 mL Intravenous PRN Cammie Sickle, MD   10 mL at 10/03/16 0823    PHYSICAL EXAMINATION: ECOG PERFORMANCE STATUS: 0 - Asymptomatic  BP 104/69 (BP Location: Left Arm, Patient Position: Sitting)   Pulse 83   Temp 97.7 F (36.5 C) (Tympanic)   Resp 18   Wt 117 lb 8 oz (53.3 kg)   BMI 20.17 kg/m   Filed Weights   10/03/16 0857  Weight: 117 lb 8 oz (53.3 kg)    GENERAL:  Well developed, well nourished, woman sitting comfortably in the exam room in no acute distress.  She is accompanied by her husband, Kiara Grant. MENTAL STATUS:  Alert and oriented to person, place and time. HEAD:  Brown hair.  Normocephalic, atraumatic, face symmetric, no Cushingoid features. EYES:  Blue eyes.  Pupils equal round and reactive to light and accomodation.  No conjunctivitis or scleral icterus. ENT:  Oropharynx  clear without lesion.  Tongue normal. Mucous membranes moist.  RESPIRATORY:  Clear to auscultation without rales, wheezes or rhonchi. CARDIOVASCULAR:  Regular rate and rhythm without murmur, rub or gallop. BREASTS: right breast with brawny edema. ABDOMEN:  Soft, non-tender, with active bowel sounds, and no hepatosplenomegaly.  No masses. SKIN:  No rashes, ulcers or lesions. EXTREMITIES: No edema, no skin discoloration or tenderness.  No palpable cords. LYMPH NODES: No palpable cervical, supraclavicular, axillary or inguinal adenopathy  NEUROLOGICAL: Unremarkable. PSYCH:  Appropriate.   # image- 09/13/2016.       LABORATORY DATA:  I have reviewed the data as listed    Component Value Date/Time   NA 137 10/03/2016 0823   NA 138 08/16/2014 1332   K 4.0 10/03/2016 0823   K 3.7 08/16/2014 1332   CL 102 10/03/2016 0823   CL 106 08/16/2014 1332  CO2 28 10/03/2016 0823   CO2 27 08/16/2014 1332   GLUCOSE 93 10/03/2016 0823   GLUCOSE 118 (H) 08/16/2014 1332   BUN 19 10/03/2016 0823   BUN 9 08/16/2014 1332   CREATININE 0.77 10/03/2016 0823   CREATININE 0.71 08/16/2014 1332   CALCIUM 8.7 (L) 10/03/2016 0823   CALCIUM 9.0 08/16/2014 1332   PROT 6.6 10/03/2016 0823   PROT 7.2 08/16/2014 1332   ALBUMIN 3.9 10/03/2016 0823   ALBUMIN 4.4 08/16/2014 1332   AST 25 10/03/2016 0823   AST 28 08/16/2014 1332   ALT 16 10/03/2016 0823   ALT 31 08/16/2014 1332   ALKPHOS 55 10/03/2016 0823   ALKPHOS 45 08/16/2014 1332   BILITOT 0.5 10/03/2016 0823   BILITOT 0.4 08/16/2014 1332   GFRNONAA >60 10/03/2016 0823   GFRNONAA >60 08/16/2014 1332   GFRAA >60 10/03/2016 0823   GFRAA >60 08/16/2014 1332    No results found for: SPEP, UPEP  Lab Results  Component Value Date   WBC 2.5 (L) 10/03/2016   NEUTROABS 1.6 10/03/2016   HGB 11.5 (L) 10/03/2016   HCT 32.6 (L) 10/03/2016   MCV 89.0 10/03/2016   PLT 145 (L) 10/03/2016      Chemistry      Component Value Date/Time   NA 137  10/03/2016 0823   NA 138 08/16/2014 1332   K 4.0 10/03/2016 0823   K 3.7 08/16/2014 1332   CL 102 10/03/2016 0823   CL 106 08/16/2014 1332   CO2 28 10/03/2016 0823   CO2 27 08/16/2014 1332   BUN 19 10/03/2016 0823   BUN 9 08/16/2014 1332   CREATININE 0.77 10/03/2016 0823   CREATININE 0.71 08/16/2014 1332      Component Value Date/Time   CALCIUM 8.7 (L) 10/03/2016 0823   CALCIUM 9.0 08/16/2014 1332   ALKPHOS 55 10/03/2016 0823   ALKPHOS 45 08/16/2014 1332   AST 25 10/03/2016 0823   AST 28 08/16/2014 1332   ALT 16 10/03/2016 0823   ALT 31 08/16/2014 1332   BILITOT 0.5 10/03/2016 0823   BILITOT 0.4 08/16/2014 1332       RADIOGRAPHIC STUDIES: I have personally reviewed the radiological images as listed and agreed with the findings in the report. No results found.   ASSESSMENT & PLAN:   Carcinoma of lower-outer quadrant of right breast in female, estrogen receptor negative ([place]) # STAGE IV RECURRENT breast cancer- inflammatory changes right breast/ PET scan shows- bilateral axillary adenopathy; left supraclavicular adenopathy. Repeat ER/PR HER-2/neu status pending.  #Staging/ and the treatment options were previously discussed with the patient in detail. Given the spread to the lymph nodes, recommendation was starting systemic therapy as soon as possible. Restaging PET scan after 3 cycles or so. Patient has significant response- then consider palliative mastectomy.   # Patient is receiving weekly Taxol-in combination with carboplatin every 3 weeks (treatment started 09/19/2016). The clinical trial Taxol vs Eribulin is closed to accrual. Patient agrees to contacting [facility] for other clinical trial options.  Proceed with day 15 of cycle #1 Carboplatin and Taxol.  Taxol only today.  Discussed counts.  Patient may develop neutropenia after today's chemotherapy.  Discuss checking counts and using GCSF support. RTC on 10/08/2016 for CBC with diff +/- Granix. Preauth Granix. Patient  has appt the following week with Dr Rogue Bussing for labs and day 1 of cycle #2 carboplatin and Taxol.  # Persistent headaches-  MRI of the brain- negative for metastases.   # Tumor sent  for [facility] testing.    No orders of the defined types were placed in this encounter.  All questions were answered. The patient knows to call the clinic with any problems, questions or concerns.      Lequita Asal, MD 10/03/2016 9:11 AM

## 2016-10-07 ENCOUNTER — Other Ambulatory Visit: Payer: Self-pay | Admitting: Hematology and Oncology

## 2016-10-08 ENCOUNTER — Inpatient Hospital Stay: Payer: BLUE CROSS/BLUE SHIELD

## 2016-10-08 ENCOUNTER — Other Ambulatory Visit: Payer: BLUE CROSS/BLUE SHIELD

## 2016-10-08 ENCOUNTER — Encounter: Payer: Self-pay | Admitting: General Surgery

## 2016-10-08 ENCOUNTER — Ambulatory Visit: Payer: BLUE CROSS/BLUE SHIELD

## 2016-10-08 DIAGNOSIS — C50511 Malignant neoplasm of lower-outer quadrant of right female breast: Secondary | ICD-10-CM

## 2016-10-08 DIAGNOSIS — Z171 Estrogen receptor negative status [ER-]: Principal | ICD-10-CM

## 2016-10-08 DIAGNOSIS — Z5111 Encounter for antineoplastic chemotherapy: Secondary | ICD-10-CM

## 2016-10-08 LAB — CBC WITH DIFFERENTIAL/PLATELET
Basophils Absolute: 0 10*3/uL (ref 0–0.1)
Basophils Relative: 1 %
Eosinophils Absolute: 0 10*3/uL (ref 0–0.7)
Eosinophils Relative: 1 %
HCT: 35 % (ref 35.0–47.0)
Hemoglobin: 11.8 g/dL — ABNORMAL LOW (ref 12.0–16.0)
Lymphocytes Relative: 44 %
Lymphs Abs: 0.8 10*3/uL — ABNORMAL LOW (ref 1.0–3.6)
MCH: 30.4 pg (ref 26.0–34.0)
MCHC: 33.8 g/dL (ref 32.0–36.0)
MCV: 90.2 fL (ref 80.0–100.0)
Monocytes Absolute: 0.2 10*3/uL (ref 0.2–0.9)
Monocytes Relative: 8 %
Neutro Abs: 0.8 10*3/uL — ABNORMAL LOW (ref 1.4–6.5)
Neutrophils Relative %: 46 %
Platelets: 116 10*3/uL — ABNORMAL LOW (ref 150–440)
RBC: 3.88 MIL/uL (ref 3.80–5.20)
RDW: 12 % (ref 11.5–14.5)
WBC: 1.8 10*3/uL — ABNORMAL LOW (ref 3.6–11.0)

## 2016-10-08 MED ORDER — TBO-FILGRASTIM 300 MCG/0.5ML ~~LOC~~ SOSY
300.0000 ug | PREFILLED_SYRINGE | Freq: Once | SUBCUTANEOUS | Status: AC
  Administered 2016-10-08: 300 ug via SUBCUTANEOUS

## 2016-10-08 NOTE — Patient Instructions (Signed)
Instructed patient to stay away from sick people. Wash hands constantly. May take ibuprofen for pain. May also take claritin to help with side effects of fatigue and achiness.

## 2016-10-10 ENCOUNTER — Ambulatory Visit: Payer: BLUE CROSS/BLUE SHIELD | Admitting: General Surgery

## 2016-10-17 ENCOUNTER — Inpatient Hospital Stay (HOSPITAL_BASED_OUTPATIENT_CLINIC_OR_DEPARTMENT_OTHER): Payer: BLUE CROSS/BLUE SHIELD | Admitting: Internal Medicine

## 2016-10-17 ENCOUNTER — Inpatient Hospital Stay: Payer: BLUE CROSS/BLUE SHIELD

## 2016-10-17 VITALS — BP 95/64 | HR 86 | Temp 97.2°F | Ht 62.0 in | Wt 116.5 lb

## 2016-10-17 DIAGNOSIS — C50511 Malignant neoplasm of lower-outer quadrant of right female breast: Secondary | ICD-10-CM | POA: Diagnosis not present

## 2016-10-17 DIAGNOSIS — Z79899 Other long term (current) drug therapy: Secondary | ICD-10-CM | POA: Diagnosis not present

## 2016-10-17 DIAGNOSIS — R590 Localized enlarged lymph nodes: Secondary | ICD-10-CM

## 2016-10-17 DIAGNOSIS — I890 Lymphedema, not elsewhere classified: Secondary | ICD-10-CM

## 2016-10-17 DIAGNOSIS — Z171 Estrogen receptor negative status [ER-]: Secondary | ICD-10-CM

## 2016-10-17 LAB — COMPREHENSIVE METABOLIC PANEL
ALK PHOS: 55 U/L (ref 38–126)
ALT: 17 U/L (ref 14–54)
AST: 23 U/L (ref 15–41)
Albumin: 4.1 g/dL (ref 3.5–5.0)
Anion gap: 7 (ref 5–15)
BUN: 15 mg/dL (ref 6–20)
CALCIUM: 9.2 mg/dL (ref 8.9–10.3)
CHLORIDE: 105 mmol/L (ref 101–111)
CO2: 28 mmol/L (ref 22–32)
CREATININE: 0.82 mg/dL (ref 0.44–1.00)
GFR calc non Af Amer: >60 mL/min (ref >60)
GLUCOSE: 79 mg/dL (ref 65–99)
Potassium: 4.1 mmol/L (ref 3.5–5.1)
SODIUM: 140 mmol/L (ref 135–145)
Total Bilirubin: 0.4 mg/dL (ref 0.3–1.2)
Total Protein: 7.1 g/dL (ref 6.5–8.1)

## 2016-10-17 LAB — CBC WITH DIFFERENTIAL/PLATELET
BASOS ABS: 0 10*3/uL (ref 0–0.1)
Basophils Relative: 1 %
EOS ABS: 0 10*3/uL (ref 0–0.7)
Eosinophils Relative: 1 %
HCT: 32.6 % — ABNORMAL LOW (ref 35.0–47.0)
HEMOGLOBIN: 11.6 g/dL — AB (ref 12.0–16.0)
LYMPHS ABS: 0.8 10*3/uL — AB (ref 1.0–3.6)
LYMPHS PCT: 27 %
MCH: 31.9 pg (ref 26.0–34.0)
MCHC: 35.5 g/dL (ref 32.0–36.0)
MCV: 89.9 fL (ref 80.0–100.0)
Monocytes Absolute: 0.2 10*3/uL (ref 0.2–0.9)
Monocytes Relative: 7 %
NEUTROS PCT: 64 %
Neutro Abs: 2 10*3/uL (ref 1.4–6.5)
Platelets: 77 10*3/uL — ABNORMAL LOW (ref 150–440)
RBC: 3.63 MIL/uL — AB (ref 3.80–5.20)
RDW: 12.5 % (ref 11.5–14.5)
WBC: 3.1 10*3/uL — AB (ref 3.6–11.0)

## 2016-10-17 MED ORDER — DEXAMETHASONE SODIUM PHOSPHATE 10 MG/ML IJ SOLN
10.0000 mg | Freq: Once | INTRAMUSCULAR | Status: AC
  Administered 2016-10-17: 10 mg via INTRAVENOUS
  Filled 2016-10-17: qty 1

## 2016-10-17 MED ORDER — DIPHENHYDRAMINE HCL 50 MG/ML IJ SOLN
50.0000 mg | Freq: Once | INTRAMUSCULAR | Status: AC
  Administered 2016-10-17: 50 mg via INTRAVENOUS
  Filled 2016-10-17: qty 1

## 2016-10-17 MED ORDER — FAMOTIDINE IN NACL 20-0.9 MG/50ML-% IV SOLN
20.0000 mg | Freq: Once | INTRAVENOUS | Status: AC
  Administered 2016-10-17: 20 mg via INTRAVENOUS
  Filled 2016-10-17: qty 50

## 2016-10-17 MED ORDER — SODIUM CHLORIDE 0.9% FLUSH
10.0000 mL | INTRAVENOUS | Status: DC | PRN
Start: 2016-10-17 — End: 2016-10-17
  Administered 2016-10-17: 10 mL via INTRAVENOUS
  Filled 2016-10-17: qty 10

## 2016-10-17 MED ORDER — SODIUM CHLORIDE 0.9 % IV SOLN
80.0000 mg/m2 | Freq: Once | INTRAVENOUS | Status: AC
  Administered 2016-10-17: 126 mg via INTRAVENOUS
  Filled 2016-10-17: qty 21

## 2016-10-17 MED ORDER — HEPARIN SOD (PORK) LOCK FLUSH 100 UNIT/ML IV SOLN
500.0000 [IU] | Freq: Once | INTRAVENOUS | Status: AC
  Administered 2016-10-17: 500 [IU] via INTRAVENOUS
  Filled 2016-10-17: qty 5

## 2016-10-17 MED ORDER — SODIUM CHLORIDE 0.9 % IV SOLN: 10.0000 mg | Freq: Once | INTRAVENOUS | Status: DC

## 2016-10-17 MED ORDER — SODIUM CHLORIDE 0.9 % IV SOLN
Freq: Once | INTRAVENOUS | Status: AC
  Administered 2016-10-17: 09:00:00 via INTRAVENOUS
  Filled 2016-10-17: qty 1000

## 2016-10-17 NOTE — Progress Notes (Signed)
Patient here today for follow up.  Patient states that she recently started having edema in her right arm.

## 2016-10-17 NOTE — Progress Notes (Signed)
Platelets: 77,000. MD, Dr. Rogue Bussing, already aware. Per MD order: proceed with scheduled treatment today.

## 2016-10-17 NOTE — Progress Notes (Signed)
[facility] OFFICE PROGRESS NOTE  Patient Care Team: Copland, [name], MD (General Surgery) Copland, Ginette Otto as Referring Physician (Physician Assistant) [name], MD (Oncology)  Cancer Staging No matching staging information was found for the patient.   Oncology History   # DEC 2015-  Carcinoma of right breast T1 be N1 M0 tumor stage II diagnosis in December of 2015 (right lower and outer quadrant). Needle biopsy positive of the right, Axillary lymph node (December, 2015), positive for metastatic breast carcinoma. Estrogen receptor negative.  Progesterone receptor weakly positive.  HER-2/neu receptor negative. # JAN 2016- NEO-ADJ CHEMO- Cytoxan, Adriamycin,  followed by Taxol (neoadjuvant therapy) April 25, 2014., 3.finished 4 cycles of chemotherapy with Cytoxan and Adriamycin on 7 th  March, 2016 4.started Taxol on a weekly basis from March 29 5.  Patient will finish weekly Taxol on 13th of June, 2016 6.  Patient had lumpectomy and axillary node evaluation. ypT1b [25m]  ypN1 c (3 /12LN)MO [Dr.Byrnett].  (July, 2016) s/p RT.   # MAY 2018- RECURRENT/ IPSILATERAL BREAST- may 2018- PET- ipsilateral & contralateral axillary/supraclavicular; right breast thickening.   [name]    -------------------------------------------------------------------------- # [employer]- No targets**  7.  Genetic mutation.  No clinically significant mutation identified (December, 2015)  # MELANOMA left neck [Dr.Graham- 2011]     Carcinoma of lower-outer quadrant of right breast in [place], estrogen receptor negative ([place]     INTERVAL HISTORY:  Kiara [place]  the above history of recurrent/ metastatic ER negative/HER-2/neu negative- currently undergoing chemotherapy with weekly Taxol/carboplatin- is post cycle #1 is here for follow-up  Patient noted to have slight  improvement of the breast lesions; otherwise denies any worsening skin lesions. Denies any new lumps or bumps. Denies any significant tingling and numbness.  Denies any nausea vomiting. Denies any bone pain. She is currently accompanied by her husband. Losing hair. No fever no chills.  REVIEW OF SYSTEMS:  A complete 10 point review of system is done which is negative except mentioned above/history of present illness.   PAST MEDICAL HISTORY :  Past Medical History:  Diagnosis Date  . BRCA negative 03/2014   [facility]  . Breast cancer ([place] [place] 03/2014   Right, T1b, N1 prYpT1b,N1a, ER neg, PR < 10 %, Her 2 neu not overexpressed.   . Cancer ([facility] 03-24-14   Right breast, microcalcifications. ER negative, PR less than 10%, HER-2/neu not amplified.  . Melanoma ([facility] Sept 2011   Left neck T1a, 0.35 mm. [facility]  .Marland KitchenPONV (postoperative nausea and vomiting)    PORT PLACEMENT ONLY    PAST SURGICAL HISTORY :   Past Surgical History:  Procedure Laterality Date  . BREAST BIOPSY Right 03-24-14   +  . BREAST EXCISIONAL BIOPSY Right 10/28/2014   Lumpectomy +  . BREAST LUMPECTOMY WITH SENTINEL LYMPH NODE BIOPSY Right 10/28/2014   Procedure: right breast wide excision with sentinel node biopsy, mastoplasty, axillary dissection ;  Surgeon: JRobert Bellow MD;  Location: [facility] ORS;  Service: General;  Laterality: Right;  . INCISIONAL BREAST BIOPSY Right 09/03/2016   Punch biopsy, invasive mammary carcinoma, Triple negative.   .Marland KitchenMELANOMA EXCISION  Nov 2011   neck  . PORT-A-CATH REMOVAL  2017  [facility]  . PORTACATH PLACEMENT N/A 09/24/2016   Procedure: INSERTION PORT-A-CATH;  Surgeon: Robert Bellow, MD;  Location: [facility] ORS;  Service: General;  Laterality: N/A;    FAMILY HISTORY :   Family History  Problem Relation Age of Onset  . Cancer Mother        lung ? mid 37    SOCIAL HISTORY:   Social History  Substance Use  Topics  . Smoking status: Never Smoker  . Smokeless tobacco: Never Used  . Alcohol use No    ALLERGIES:  is allergic to sulfa antibiotics.  MEDICATIONS:  Current Outpatient Prescriptions  Medication Sig Dispense Refill  . Misc Natural Products (DANDELION ROOT PO) Take by mouth.    . ondansetron (ZOFRAN) 8 MG tablet Take 8 mg by mouth every 8 (eight) hours as needed for nausea or vomiting.    . TURMERIC PO Take 1,000 mg by mouth.     No current facility-administered medications for this visit.    Facility-Administered Medications Ordered in Other Visits  Medication Dose Route Frequency Provider Last Rate Last Dose  . sodium chloride 0.9 % injection 10 mL  10 mL Intravenous PRN Evlyn Kanner, NP   10 mL at 11/08/14 1207    PHYSICAL EXAMINATION: ECOG PERFORMANCE STATUS: 0 - Asymptomatic  BP 95/64 (BP Location: Left Arm, Patient Position: Sitting)   Pulse 86   Temp 97.2 F (36.2 C) (Tympanic)   Ht 5' 2"  (1.575 m)   Wt 116 lb 8 oz (52.8 kg)   BMI 21.31 kg/m   Filed Weights   10/17/16 0841  Weight: 116 lb 8 oz (52.8 kg)    GENERAL: Well-nourished well-developed; Alert, no distress and comfortable.   With her husband.  EYES: no pallor or icterus OROPHARYNX: no thrush or ulceration; good dentition  NECK: supple, no masses felt LYMPH:  no palpable lymphadenopathy in the cervical Or inguinal region. Positive for a centimeter sized lymph node in the left axilla. None on the right. LUNGS: clear to auscultation and  No wheeze or crackles HEART/CVS: regular rate & rhythm and no murmurs; No lower extremity edema ABDOMEN:abdomen soft, non-tender and normal bowel sounds Musculoskeletal:no cyanosis of digits and no clubbing  PSYCH: alert & oriented x 3 with fluent speech NEURO: no focal motor/sensory deficits SKIN:  Patient has raised papules over the right breast; no definite mass noted. Edematous; mild tenderness noted  # image- 09/13/2016.    June 28th 2018.         LABORATORY DATA:  I have reviewed the data as listed    Component Value Date/Time   NA 140 10/17/2016 0826   NA 138 08/16/2014 1332   K 4.1 10/17/2016 0826   K 3.7 08/16/2014 1332   CL 105 10/17/2016 0826   CL 106 08/16/2014 1332   CO2 28 10/17/2016 0826   CO2 27 08/16/2014 1332   GLUCOSE 79 10/17/2016 0826   GLUCOSE 118 (H) 08/16/2014 1332   BUN 15 10/17/2016 0826   BUN 9 08/16/2014 1332   CREATININE 0.82 10/17/2016 0826   CREATININE 0.71 08/16/2014 1332   CALCIUM 9.2 10/17/2016 0826   CALCIUM 9.0 08/16/2014 1332   PROT 7.1 10/17/2016 0826   PROT 7.2 08/16/2014 1332   ALBUMIN 4.1 10/17/2016 0826   ALBUMIN 4.4 08/16/2014 1332   AST 23 10/17/2016 0826   AST 28 08/16/2014 1332   ALT 17 10/17/2016 0826   ALT 31 08/16/2014 1332   ALKPHOS 55 10/17/2016 0826   ALKPHOS 45 08/16/2014 1332   BILITOT 0.4 10/17/2016 0826   BILITOT 0.4  08/16/2014 1332   GFRNONAA >60 10/17/2016 0826   GFRNONAA >60 08/16/2014 1332   GFRAA >60 10/17/2016 0826   GFRAA >60 08/16/2014 1332    No results found for: SPEP, UPEP  Lab Results  Component Value Date   WBC 3.1 (L) 10/17/2016   NEUTROABS 2.0 10/17/2016   HGB 11.6 (L) 10/17/2016   HCT 32.6 (L) 10/17/2016   MCV 89.9 10/17/2016   PLT 77 (L) 10/17/2016      Chemistry      Component Value Date/Time   NA 140 10/17/2016 0826   NA 138 08/16/2014 1332   K 4.1 10/17/2016 0826   K 3.7 08/16/2014 1332   CL 105 10/17/2016 0826   CL 106 08/16/2014 1332   CO2 28 10/17/2016 0826   CO2 27 08/16/2014 1332   BUN 15 10/17/2016 0826   BUN 9 08/16/2014 1332   CREATININE 0.82 10/17/2016 0826   CREATININE 0.71 08/16/2014 1332      Component Value Date/Time   CALCIUM 9.2 10/17/2016 0826   CALCIUM 9.0 08/16/2014 1332   ALKPHOS 55 10/17/2016 0826   ALKPHOS 45 08/16/2014 1332   AST 23 10/17/2016 0826   AST 28 08/16/2014 1332   ALT 17 10/17/2016 0826   ALT 31 08/16/2014 1332   BILITOT 0.4 10/17/2016 0826   BILITOT 0.4 08/16/2014  1332       RADIOGRAPHIC STUDIES: I have personally reviewed the radiological images as listed and agreed with the findings in the report. No results found.   ASSESSMENT & PLAN:  Carcinoma of lower-outer quadrant of right breast in [place], estrogen receptor negative ([place]) # STAGE IV RECURRENT breast cancer/ TRIPLE NEGATIVE- inflammatory changes right breast/ PET scan shows- bilateral axillary adenopathy; left supraclavicular adenopathy. On carbo q 3 w; taxol weekly.Status post cycle #1.   # Slight improvement in the inflammatory area of the breast noted. Tolerated chemotherapy fairly well except for mild nausea/ hair loss.  # Proceed with cycle #2. However platelets 73 today. Will HOLD carboplatin for cycle #2. Continue Taxol weekly.  #Continue weekly CBC BMP Taxol; follow-up with me in 4 weeks for treatment again.- cabo-taxol.   # As the patient to proceed with [facility] opinion [facility]. .   # Weekly labs chemotherapy 2; follow-up with me in 4 weeks carbotaxol labs.    Orders Placed This Encounter  Procedures  . AMB referral to rehabilitation    Referral Priority:   Routine    Referral Type:   Consultation    Number of Visits Requested:   1   All questions were answered. The patient knows to call the clinic with any problems, questions or concerns.      Cammie Sickle, MD 10/18/2016 7:08 PM

## 2016-10-17 NOTE — Assessment & Plan Note (Addendum)
#   STAGE IV RECURRENT breast cancer/ TRIPLE NEGATIVE- inflammatory changes right breast/ PET scan shows- bilateral axillary adenopathy; left supraclavicular adenopathy. On carbo q 3 w; taxol weekly.Status post cycle #1.   # Slight improvement in the inflammatory area of the breast noted. Tolerated chemotherapy fairly well except for mild nausea/ hair loss.  # Proceed with cycle #2. However platelets 73 today. Will HOLD carboplatin for cycle #2. Continue Taxol weekly.  #Continue weekly CBC BMP Taxol; follow-up with me in 4 weeks for treatment again.- cabo-taxol.   # As the patient to proceed with second opinion at [facility]. .   # Weekly labs chemotherapy 2; follow-up with me in 4 weeks carbotaxol labs.

## 2016-10-18 ENCOUNTER — Encounter: Payer: Self-pay | Admitting: Internal Medicine

## 2016-10-18 ENCOUNTER — Telehealth: Payer: Self-pay | Admitting: Internal Medicine

## 2016-10-18 NOTE — Telephone Encounter (Signed)
Spoke to [facility]; agrees with current plan. No clinical trials available at this time. She agrees to evaluate the patient at anytime.   Please inform pt of above; and make a referral to [facility].

## 2016-10-21 ENCOUNTER — Encounter: Payer: Self-pay | Admitting: *Deleted

## 2016-10-21 ENCOUNTER — Ambulatory Visit: Payer: BLUE CROSS/BLUE SHIELD | Attending: Internal Medicine | Admitting: Occupational Therapy

## 2016-10-21 ENCOUNTER — Other Ambulatory Visit: Payer: Self-pay | Admitting: *Deleted

## 2016-10-21 DIAGNOSIS — I890 Lymphedema, not elsewhere classified: Secondary | ICD-10-CM | POA: Diagnosis present

## 2016-10-21 DIAGNOSIS — Z171 Estrogen receptor negative status [ER-]: Principal | ICD-10-CM

## 2016-10-21 DIAGNOSIS — C50511 Malignant neoplasm of lower-outer quadrant of right female breast: Secondary | ICD-10-CM

## 2016-10-21 NOTE — Progress Notes (Signed)
Spoke with Dr. Rogue Bussing- clarification on referral reason for lymphedema referral.  Per MD- pt has right upper extremity lymphedema.

## 2016-10-21 NOTE — Telephone Encounter (Signed)
Referral entered for Dr. Hiram Comber. My chart msg sent to pt regarding referral.

## 2016-10-21 NOTE — Therapy (Signed)
[facility] [address], [place], [address] Phone: [phone]   Fax:  [phone]  Occupational Therapy Evaluation  Patient Details  Name: Kiara [place] MRN: [id number] Date of Birth: June 10, 1970 Referring Provider: Dr Rogue Bussing   Encounter Date: 10/21/2016      OT End of Session - 10/21/16 1504    Visit Number 1   Number of Visits 6   Date for OT Re-Evaluation 12/02/16   OT Start Time 1402   OT Stop Time 1440   OT Time Calculation (min) 38 min   Activity Tolerance Patient tolerated treatment well   Behavior During Therapy [facility] for tasks assessed/performed      Past Medical History:  Diagnosis Date  . BRCA negative 03/2014   Testing completed at [facility]  . Breast cancer ([place]) 03/2014   Right, T1b, N1 prYpT1b,N1a, ER neg, PR < 10 %, Her 2 neu not overexpressed.   . Cancer [facility]) 03-24-14   Right breast, microcalcifications. ER negative, PR less than 10%, HER-2/neu not amplified.  . Melanoma [facility]) Sept 2011   Left neck T1a, 0.35 mm. Treated by Mohs injury [facility]  Marland Kitchen PONV (postoperative nausea and vomiting)    PORT PLACEMENT ONLY    Past Surgical History:  Procedure Laterality Date  . BREAST BIOPSY Right 03-24-14   +  . BREAST EXCISIONAL BIOPSY Right 10/28/2014   Lumpectomy +  . BREAST LUMPECTOMY WITH SENTINEL LYMPH NODE BIOPSY Right 10/28/2014   Procedure: right breast wide excision with sentinel node biopsy, mastoplasty, axillary dissection ;  Surgeon: Robert Bellow, MD;  Location: [facility] ORS;  Service: General;  Laterality: Right;  . INCISIONAL BREAST BIOPSY Right 09/03/2016   Punch biopsy, invasive mammary carcinoma, Triple negative.   Marland Kitchen MELANOMA EXCISION  Nov 2011   neck  . PORT-A-CATH REMOVAL  2017  . [facility] PLACEMENT  Dec 2015  . PORTACATH PLACEMENT N/A 09/24/2016   Procedure: INSERTION PORT-A-CATH;  Surgeon: Robert Bellow, MD;  Location: [facility] ORS;  Service: General;   Laterality: N/A;    There were no vitals filed for this visit.      Subjective Assessment - 10/21/16 1453    Subjective  My arm started swelling about 2 wks ago - I had R breast CA in 2015/16 and did not had issues - they removed 12 ln then - but my breast CA is back again in R breast and in my L  ln    Patient Stated Goals I just want to keep the swelling under control in my L arm    Currently in Pain? No/denies           [facility] OT Assessment - 10/21/16 0001      Assessment   Diagnosis R Upper quadrant and UE lymphedema    Referring Provider Dr Rogue Bussing    Onset Date 10/07/16     Precautions   Precaution Comments lymphedema     Home  Environment   Lives With Family     Prior Function   Vocation Full time employment   Leisure Work in finance, L hand dominant , hiking , reading , spending time with family           LYMPHEDEMA/ONCOLOGY QUESTIONNAIRE - 10/21/16 1458      Type   Cancer Type L breast     Surgeries   Lumpectomy Date 10/28/14   Number Lymph Nodes Removed 12     Treatment  Active Chemotherapy Treatment Yes   Past Chemotherapy Treatment Yes   Date --  2015   Past Radiation Treatment Yes   Date --  2015/16     What other symptoms do you have   Are you Having Heaviness or Tightness Yes     Lymphedema Stage   Stage STAGE 2 SPONTANEOUSLY IRREVERSIBLE     Right Upper Extremity Lymphedema   15 cm Proximal to Olecranon Process 29.3 cm   10 cm Proximal to Olecranon Process 28 cm   Olecranon Process 24.8 cm   15 cm Proximal to Ulnar Styloid Process 24 cm   10 cm Proximal to Ulnar Styloid Process 21 cm   Just Proximal to Ulnar Styloid Process 15.8 cm   Across Hand at [employer] 19 cm   At [place] of 2nd Digit 5.9 cm   At [facility] of Thumb 6.1 cm     Left Upper Extremity Lymphedema   15 cm Proximal to Olecranon Process 26.4 cm   10 cm Proximal to Olecranon Process 25 cm   Olecranon Process 23 cm   15 cm Proximal to Ulnar Styloid Process 22 cm    10 cm Proximal to Ulnar Styloid Process 19.2 cm   Just Proximal to Ulnar Styloid Process 14.8 cm   Across Hand at [employer] 19.3 cm   At [place] of 2nd Digit 6.1 cm   At [facility] of Thumb 5.9 cm      pt evaluated  And   Refer pt to DME   for over the counter compression sleeve and gauntlet for day time use  And jovipak unilateral compression breast pad to wear At night and as needed during day  Ed pt on doing MLD from upper arm over shoulder ,and out of inner upper arm to outside and then over upper arm  Pt to phone OT after getting sleeve to schedule appt                  OT Education - 10/21/16 1504    Education provided Yes   Education Details lymphedema, findings and POC    Person(s) Educated Patient   Methods Explanation;Demonstration;Tactile cues;Verbal cues;Handout   Comprehension Verbal cues required;Returned demonstration;Verbalized understanding             OT Long Term Goals - 10/21/16 1511      OT LONG TERM GOAL #1   Title Pt to be independent in wearing correct compression garments to decrease or keep R UE lymphedema and upper quadrant under control    Baseline wrist increase by 1 cm , forearm to elbow increase by 2 cm and upper arm 3.3 mc    Time 6   Period Weeks   Status New               Plan - 10/21/16 1504    Clinical Impression Statement Pt present at eval with diagnosis of R upper quadrant lymphedema and R UE - pt's onset about 2 wks ago - pt had 2 yrs ago R breast CA with 12 R axillary ln removed and chemo/radiation -  and now she has recurrent inflammatory breast CA in R breast and  in L  ln per pt - pt started chemo about 4 wks ago -  pt is L hand dominant - pt's circumference in R  are increase by 1 cm at wrist , 2 cm at forearm to elbow, but 3 to 3.3 cm  at upper arm - pt  to wear over the counter compression  sleeve  with gauntlet and  jovipak compression breast pad for lymphedema under axilla and scapula to clear thorasic and  follow up for reassesment    Occupational performance deficits (Please refer to evaluation for details): ADL's;IADL's   Rehab Potential Good   OT Frequency 1x / week   OT Duration 6 weeks   OT Treatment/Interventions Self-care/ADL training;Manual lymph drainage;Compression bandaging;Patient/family education   Plan reassess circumference after pt is wearing compression for week    Clinical Decision Making Limited treatment options, no task modification necessary   OT Home Exercise Plan see pt instructions    Consulted and Agree with Plan of Care Patient      Patient will benefit from skilled therapeutic intervention in order to improve the following deficits and impairments:  Increased edema, Impaired UE functional use  Visit Diagnosis: Lymphedema, not elsewhere classified - Plan: Ot plan of care cert/re-cert    Problem List Patient Active Problem List   Diagnosis Date Noted  . Encounter for antineoplastic chemotherapy 10/03/2016  . Persistent headaches 09/13/2016  . Counseling regarding goals of care 09/13/2016  . Carcinoma of lower-outer quadrant of right breast in female, estrogen receptor negative ([place]) 01/30/2015  . Postoperative nausea 09/30/2014    Rosalyn Gess OTR/L,CLT 10/21/2016, 3:17 PM   [facility] [facility] [address] [place], [address] Phone: [phone]   Fax:  [phone]  Name: Kiara [place] MRN: [id number] Date of Birth: [birth year]

## 2016-10-21 NOTE — Patient Instructions (Signed)
Refer pt to DME   for over the counter compression sleeve and gauntlet for day time use  And jovipak unilateral compression breast pad to wear At night and as needed during day

## 2016-10-22 ENCOUNTER — Encounter (HOSPITAL_COMMUNITY): Payer: Self-pay

## 2016-10-25 ENCOUNTER — Inpatient Hospital Stay: Payer: BLUE CROSS/BLUE SHIELD | Attending: Internal Medicine

## 2016-10-25 ENCOUNTER — Inpatient Hospital Stay: Payer: BLUE CROSS/BLUE SHIELD

## 2016-10-25 ENCOUNTER — Other Ambulatory Visit: Payer: Self-pay | Admitting: Internal Medicine

## 2016-10-25 DIAGNOSIS — Z171 Estrogen receptor negative status [ER-]: Principal | ICD-10-CM

## 2016-10-25 DIAGNOSIS — Z5111 Encounter for antineoplastic chemotherapy: Secondary | ICD-10-CM | POA: Insufficient documentation

## 2016-10-25 DIAGNOSIS — Z923 Personal history of irradiation: Secondary | ICD-10-CM | POA: Diagnosis not present

## 2016-10-25 DIAGNOSIS — Z79899 Other long term (current) drug therapy: Secondary | ICD-10-CM | POA: Insufficient documentation

## 2016-10-25 DIAGNOSIS — C50511 Malignant neoplasm of lower-outer quadrant of right female breast: Secondary | ICD-10-CM | POA: Insufficient documentation

## 2016-10-25 DIAGNOSIS — Z85820 Personal history of malignant melanoma of skin: Secondary | ICD-10-CM | POA: Diagnosis not present

## 2016-10-25 LAB — COMPREHENSIVE METABOLIC PANEL
ALK PHOS: 59 U/L (ref 38–126)
ALT: 14 U/L (ref 14–54)
ANION GAP: 5 (ref 5–15)
AST: 20 U/L (ref 15–41)
Albumin: 4 g/dL (ref 3.5–5.0)
BILIRUBIN TOTAL: 0.4 mg/dL (ref 0.3–1.2)
BUN: 16 mg/dL (ref 6–20)
CALCIUM: 8.9 mg/dL (ref 8.9–10.3)
CO2: 27 mmol/L (ref 22–32)
CREATININE: 0.84 mg/dL (ref 0.44–1.00)
Chloride: 103 mmol/L (ref 101–111)
GFR calc non Af Amer: >60 mL/min (ref >60)
Glucose, Bld: 107 mg/dL — ABNORMAL HIGH (ref 65–99)
Potassium: 3.9 mmol/L (ref 3.5–5.1)
SODIUM: 135 mmol/L (ref 135–145)
TOTAL PROTEIN: 6.8 g/dL (ref 6.5–8.1)

## 2016-10-25 LAB — CBC WITH DIFFERENTIAL/PLATELET
Basophils Absolute: 0 10*3/uL (ref 0–0.1)
Basophils Relative: 1 %
EOS ABS: 0 10*3/uL (ref 0–0.7)
Eosinophils Relative: 3 %
HEMATOCRIT: 30.7 % — AB (ref 35.0–47.0)
HEMOGLOBIN: 11.1 g/dL — AB (ref 12.0–16.0)
LYMPHS ABS: 0.9 10*3/uL — AB (ref 1.0–3.6)
LYMPHS PCT: 47 %
MCH: 32.5 pg (ref 26.0–34.0)
MCHC: 36.3 g/dL — AB (ref 32.0–36.0)
MCV: 89.6 fL (ref 80.0–100.0)
MONOS PCT: 10 %
Monocytes Absolute: 0.2 10*3/uL (ref 0.2–0.9)
NEUTROS ABS: 0.7 10*3/uL — AB (ref 1.4–6.5)
NEUTROS PCT: 39 %
Platelets: 211 10*3/uL (ref 150–440)
RBC: 3.43 MIL/uL — AB (ref 3.80–5.20)
RDW: 13.5 % (ref 11.5–14.5)
WBC: 1.9 10*3/uL — AB (ref 3.6–11.0)

## 2016-10-25 MED ORDER — HEPARIN SOD (PORK) LOCK FLUSH 100 UNIT/ML IV SOLN
500.0000 [IU] | Freq: Once | INTRAVENOUS | Status: AC
  Administered 2016-10-25: 500 [IU] via INTRAVENOUS

## 2016-10-25 MED ORDER — SODIUM CHLORIDE 0.9% FLUSH
10.0000 mL | INTRAVENOUS | Status: DC | PRN
  Administered 2016-10-25: 10 mL via INTRAVENOUS
  Filled 2016-10-25: qty 10

## 2016-10-25 NOTE — Progress Notes (Signed)
ANC 0.7 today. Per Dr Rogue Bussing no treatment today.

## 2016-10-31 ENCOUNTER — Inpatient Hospital Stay: Payer: BLUE CROSS/BLUE SHIELD

## 2016-10-31 ENCOUNTER — Other Ambulatory Visit: Payer: Self-pay | Admitting: Internal Medicine

## 2016-10-31 VITALS — BP 97/67 | HR 80 | Temp 97.3°F | Resp 18

## 2016-10-31 DIAGNOSIS — Z171 Estrogen receptor negative status [ER-]: Principal | ICD-10-CM

## 2016-10-31 DIAGNOSIS — C50511 Malignant neoplasm of lower-outer quadrant of right female breast: Secondary | ICD-10-CM | POA: Diagnosis not present

## 2016-10-31 LAB — CBC WITH DIFFERENTIAL/PLATELET
BASOS PCT: 1 %
Basophils Absolute: 0 10*3/uL (ref 0–0.1)
EOS ABS: 0.1 10*3/uL (ref 0–0.7)
EOS PCT: 3 %
HCT: 33.4 % — ABNORMAL LOW (ref 35.0–47.0)
Hemoglobin: 11.8 g/dL — ABNORMAL LOW (ref 12.0–16.0)
LYMPHS ABS: 0.9 10*3/uL — AB (ref 1.0–3.6)
Lymphocytes Relative: 38 %
MCH: 32.4 pg (ref 26.0–34.0)
MCHC: 35.4 g/dL (ref 32.0–36.0)
MCV: 91.5 fL (ref 80.0–100.0)
MONOS PCT: 17 %
Monocytes Absolute: 0.4 10*3/uL (ref 0.2–0.9)
Neutro Abs: 1 10*3/uL — ABNORMAL LOW (ref 1.4–6.5)
Neutrophils Relative %: 41 %
PLATELETS: 265 10*3/uL (ref 150–440)
RBC: 3.65 MIL/uL — AB (ref 3.80–5.20)
RDW: 17 % — ABNORMAL HIGH (ref 11.5–14.5)
WBC: 2.5 10*3/uL — AB (ref 3.6–11.0)

## 2016-10-31 LAB — COMPREHENSIVE METABOLIC PANEL
ALT: 13 U/L — ABNORMAL LOW (ref 14–54)
ANION GAP: 9 (ref 5–15)
AST: 20 U/L (ref 15–41)
Albumin: 4.2 g/dL (ref 3.5–5.0)
Alkaline Phosphatase: 60 U/L (ref 38–126)
BUN: 15 mg/dL (ref 6–20)
CHLORIDE: 103 mmol/L (ref 101–111)
CO2: 27 mmol/L (ref 22–32)
Calcium: 9.8 mg/dL (ref 8.9–10.3)
Creatinine, Ser: 0.79 mg/dL (ref 0.44–1.00)
GFR calc non Af Amer: >60 mL/min (ref >60)
Glucose, Bld: 87 mg/dL (ref 65–99)
Potassium: 4.4 mmol/L (ref 3.5–5.1)
SODIUM: 139 mmol/L (ref 135–145)
Total Bilirubin: 0.5 mg/dL (ref 0.3–1.2)
Total Protein: 7.3 g/dL (ref 6.5–8.1)

## 2016-10-31 MED ORDER — DIPHENHYDRAMINE HCL 50 MG/ML IJ SOLN
50.0000 mg | Freq: Once | INTRAMUSCULAR | Status: AC
  Administered 2016-10-31: 50 mg via INTRAVENOUS
  Filled 2016-10-31: qty 1

## 2016-10-31 MED ORDER — HEPARIN SOD (PORK) LOCK FLUSH 100 UNIT/ML IV SOLN
500.0000 [IU] | Freq: Once | INTRAVENOUS | Status: AC
  Administered 2016-10-31: 500 [IU] via INTRAVENOUS
  Filled 2016-10-31: qty 5

## 2016-10-31 MED ORDER — SODIUM CHLORIDE 0.9 % IV SOLN
Freq: Once | INTRAVENOUS | Status: AC
  Administered 2016-10-31: 10:00:00 via INTRAVENOUS
  Filled 2016-10-31: qty 1000

## 2016-10-31 MED ORDER — SODIUM CHLORIDE 0.9 % IV SOLN: 10.0000 mg | Freq: Once | INTRAVENOUS | Status: DC

## 2016-10-31 MED ORDER — TBO-FILGRASTIM 480 MCG/0.8ML ~~LOC~~ SOSY
480.0000 ug | PREFILLED_SYRINGE | Freq: Once | SUBCUTANEOUS | Status: AC
  Administered 2016-10-31: 480 ug via SUBCUTANEOUS

## 2016-10-31 MED ORDER — SODIUM CHLORIDE 0.9% FLUSH
10.0000 mL | Freq: Once | INTRAVENOUS | Status: AC
  Administered 2016-10-31: 10 mL via INTRAVENOUS
  Filled 2016-10-31: qty 10

## 2016-10-31 MED ORDER — DEXAMETHASONE SODIUM PHOSPHATE 10 MG/ML IJ SOLN
10.0000 mg | Freq: Once | INTRAMUSCULAR | Status: AC
  Administered 2016-10-31: 10 mg via INTRAVENOUS
  Filled 2016-10-31: qty 1

## 2016-10-31 MED ORDER — FAMOTIDINE IN NACL 20-0.9 MG/50ML-% IV SOLN
20.0000 mg | Freq: Once | INTRAVENOUS | Status: AC
  Administered 2016-10-31: 20 mg via INTRAVENOUS
  Filled 2016-10-31: qty 50

## 2016-10-31 MED ORDER — PACLITAXEL CHEMO INJECTION 300 MG/50ML
80.0000 mg/m2 | Freq: Once | INTRAVENOUS | Status: AC
  Administered 2016-10-31: 126 mg via INTRAVENOUS
  Filled 2016-10-31: qty 21

## 2016-10-31 NOTE — Progress Notes (Signed)
ANC: 1000. MD, Dr. Rogue Bussing, notified via telephone. Per MD order proceed with scheduled treatment today. MD to place orders.

## 2016-11-01 ENCOUNTER — Inpatient Hospital Stay: Payer: BLUE CROSS/BLUE SHIELD

## 2016-11-01 ENCOUNTER — Telehealth: Payer: Self-pay | Admitting: *Deleted

## 2016-11-01 VITALS — BP 112/70 | HR 110 | Temp 97.3°F | Resp 18

## 2016-11-01 DIAGNOSIS — C50511 Malignant neoplasm of lower-outer quadrant of right female breast: Secondary | ICD-10-CM

## 2016-11-01 DIAGNOSIS — Z171 Estrogen receptor negative status [ER-]: Principal | ICD-10-CM

## 2016-11-01 MED ORDER — TBO-FILGRASTIM 480 MCG/0.8ML ~~LOC~~ SOSY
480.0000 ug | PREFILLED_SYRINGE | Freq: Once | SUBCUTANEOUS | Status: AC
  Administered 2016-11-01: 480 ug via SUBCUTANEOUS

## 2016-11-01 NOTE — Telephone Encounter (Signed)
-----   Message from [place] sent at 10/31/2016  6:23 PM EDT ----- Neulasta went to peer-to-peer.  Please call [phone] option 3. Her ID number is [id number].  Thanks, Aleen Sells

## 2016-11-01 NOTE — Telephone Encounter (Signed)
msg given to Dr. Jacinto Reap

## 2016-11-04 ENCOUNTER — Inpatient Hospital Stay: Payer: BLUE CROSS/BLUE SHIELD

## 2016-11-04 VITALS — BP 121/74 | HR 98 | Temp 96.8°F | Resp 18

## 2016-11-04 DIAGNOSIS — C50511 Malignant neoplasm of lower-outer quadrant of right female breast: Secondary | ICD-10-CM

## 2016-11-04 DIAGNOSIS — Z171 Estrogen receptor negative status [ER-]: Principal | ICD-10-CM

## 2016-11-04 MED ORDER — TBO-FILGRASTIM 480 MCG/0.8ML ~~LOC~~ SOSY
480.0000 ug | PREFILLED_SYRINGE | Freq: Once | SUBCUTANEOUS | Status: AC
Start: 2016-11-04 — End: 2016-11-04
  Administered 2016-11-04: 480 ug via SUBCUTANEOUS

## 2016-11-05 ENCOUNTER — Inpatient Hospital Stay: Payer: BLUE CROSS/BLUE SHIELD

## 2016-11-05 ENCOUNTER — Other Ambulatory Visit: Payer: Self-pay | Admitting: *Deleted

## 2016-11-05 ENCOUNTER — Other Ambulatory Visit: Payer: Self-pay | Admitting: Internal Medicine

## 2016-11-05 VITALS — BP 100/67 | HR 76 | Temp 97.7°F | Resp 16

## 2016-11-05 DIAGNOSIS — C50511 Malignant neoplasm of lower-outer quadrant of right female breast: Secondary | ICD-10-CM

## 2016-11-05 DIAGNOSIS — Z171 Estrogen receptor negative status [ER-]: Principal | ICD-10-CM

## 2016-11-05 MED ORDER — TBO-FILGRASTIM 480 MCG/0.8ML ~~LOC~~ SOSY
480.0000 ug | PREFILLED_SYRINGE | Freq: Once | SUBCUTANEOUS | Status: AC
  Administered 2016-11-05: 480 ug via SUBCUTANEOUS

## 2016-11-06 ENCOUNTER — Encounter: Payer: Self-pay | Admitting: *Deleted

## 2016-11-06 ENCOUNTER — Inpatient Hospital Stay: Payer: BLUE CROSS/BLUE SHIELD

## 2016-11-06 DIAGNOSIS — C50511 Malignant neoplasm of lower-outer quadrant of right female breast: Secondary | ICD-10-CM

## 2016-11-06 DIAGNOSIS — Z171 Estrogen receptor negative status [ER-]: Principal | ICD-10-CM

## 2016-11-06 MED ORDER — TBO-FILGRASTIM 480 MCG/0.8ML ~~LOC~~ SOSY
480.0000 ug | PREFILLED_SYRINGE | Freq: Once | SUBCUTANEOUS | Status: AC
  Administered 2016-11-06: 480 ug via SUBCUTANEOUS

## 2016-11-14 ENCOUNTER — Inpatient Hospital Stay: Payer: BLUE CROSS/BLUE SHIELD

## 2016-11-14 ENCOUNTER — Inpatient Hospital Stay (HOSPITAL_BASED_OUTPATIENT_CLINIC_OR_DEPARTMENT_OTHER): Payer: BLUE CROSS/BLUE SHIELD | Admitting: Internal Medicine

## 2016-11-14 ENCOUNTER — Other Ambulatory Visit: Payer: Self-pay | Admitting: Internal Medicine

## 2016-11-14 VITALS — BP 107/71 | HR 83 | Temp 97.6°F | Resp 18 | Ht 62.0 in | Wt 116.0 lb

## 2016-11-14 DIAGNOSIS — Z171 Estrogen receptor negative status [ER-]: Secondary | ICD-10-CM | POA: Diagnosis not present

## 2016-11-14 DIAGNOSIS — Z79899 Other long term (current) drug therapy: Secondary | ICD-10-CM | POA: Diagnosis not present

## 2016-11-14 DIAGNOSIS — C50511 Malignant neoplasm of lower-outer quadrant of right female breast: Secondary | ICD-10-CM

## 2016-11-14 DIAGNOSIS — Z923 Personal history of irradiation: Secondary | ICD-10-CM

## 2016-11-14 LAB — COMPREHENSIVE METABOLIC PANEL
ALT: 30 U/L (ref 14–54)
AST: 27 U/L (ref 15–41)
Albumin: 4.2 g/dL (ref 3.5–5.0)
Alkaline Phosphatase: 75 U/L (ref 38–126)
Anion gap: 3 — ABNORMAL LOW (ref 5–15)
BILIRUBIN TOTAL: 0.8 mg/dL (ref 0.3–1.2)
BUN: 13 mg/dL (ref 6–20)
CHLORIDE: 103 mmol/L (ref 101–111)
CO2: 29 mmol/L (ref 22–32)
Calcium: 9.4 mg/dL (ref 8.9–10.3)
Creatinine, Ser: 0.74 mg/dL (ref 0.44–1.00)
Glucose, Bld: 78 mg/dL (ref 65–99)
POTASSIUM: 3.8 mmol/L (ref 3.5–5.1)
Sodium: 135 mmol/L (ref 135–145)
TOTAL PROTEIN: 7.2 g/dL (ref 6.5–8.1)

## 2016-11-14 LAB — CBC WITH DIFFERENTIAL/PLATELET
Basophils Absolute: 0.1 10*3/uL (ref 0–0.1)
Basophils Relative: 2 %
Eosinophils Absolute: 0.1 10*3/uL (ref 0–0.7)
Eosinophils Relative: 2 %
HEMATOCRIT: 33.9 % — AB (ref 35.0–47.0)
Hemoglobin: 11.8 g/dL — ABNORMAL LOW (ref 12.0–16.0)
LYMPHS ABS: 0.9 10*3/uL — AB (ref 1.0–3.6)
LYMPHS PCT: 26 %
MCH: 32.4 pg (ref 26.0–34.0)
MCHC: 34.8 g/dL (ref 32.0–36.0)
MCV: 93 fL (ref 80.0–100.0)
MONO ABS: 0.4 10*3/uL (ref 0.2–0.9)
MONOS PCT: 12 %
NEUTROS ABS: 2 10*3/uL (ref 1.4–6.5)
Neutrophils Relative %: 58 %
PLATELETS: 198 10*3/uL (ref 150–440)
RBC: 3.64 MIL/uL — ABNORMAL LOW (ref 3.80–5.20)
RDW: 17.4 % — AB (ref 11.5–14.5)
WBC: 3.4 10*3/uL — ABNORMAL LOW (ref 3.6–11.0)

## 2016-11-14 MED ORDER — PALONOSETRON HCL INJECTION 0.25 MG/5ML
0.2500 mg | Freq: Once | INTRAVENOUS | Status: AC
  Administered 2016-11-14: 0.25 mg via INTRAVENOUS
  Filled 2016-11-14: qty 5

## 2016-11-14 MED ORDER — SODIUM CHLORIDE 0.9 % IV SOLN
Freq: Once | INTRAVENOUS | Status: AC
  Administered 2016-11-14: 11:00:00 via INTRAVENOUS
  Filled 2016-11-14: qty 1000

## 2016-11-14 MED ORDER — DIPHENHYDRAMINE HCL 50 MG/ML IJ SOLN
50.0000 mg | Freq: Once | INTRAMUSCULAR | Status: AC
  Administered 2016-11-14: 50 mg via INTRAVENOUS
  Filled 2016-11-14: qty 1

## 2016-11-14 MED ORDER — FAMOTIDINE IN NACL 20-0.9 MG/50ML-% IV SOLN
20.0000 mg | Freq: Once | INTRAVENOUS | Status: AC
  Administered 2016-11-14: 20 mg via INTRAVENOUS
  Filled 2016-11-14: qty 50

## 2016-11-14 MED ORDER — PACLITAXEL CHEMO INJECTION 300 MG/50ML
80.0000 mg/m2 | Freq: Once | INTRAVENOUS | Status: AC
  Administered 2016-11-14: 126 mg via INTRAVENOUS
  Filled 2016-11-14: qty 21

## 2016-11-14 MED ORDER — SODIUM CHLORIDE 0.9 % IV SOLN
398.0000 mg | Freq: Once | INTRAVENOUS | Status: AC
  Administered 2016-11-14: 400 mg via INTRAVENOUS
  Filled 2016-11-14: qty 40

## 2016-11-14 MED ORDER — HEPARIN SOD (PORK) LOCK FLUSH 100 UNIT/ML IV SOLN
500.0000 [IU] | Freq: Once | INTRAVENOUS | Status: AC | PRN
  Administered 2016-11-14: 500 [IU]
  Filled 2016-11-14: qty 5

## 2016-11-14 MED ORDER — DEXAMETHASONE SODIUM PHOSPHATE 10 MG/ML IJ SOLN
10.0000 mg | Freq: Once | INTRAMUSCULAR | Status: AC
  Administered 2016-11-14: 10 mg via INTRAVENOUS
  Filled 2016-11-14: qty 1

## 2016-11-14 MED ORDER — SODIUM CHLORIDE 0.9% FLUSH
10.0000 mL | INTRAVENOUS | Status: DC | PRN
  Administered 2016-11-14: 10 mL
  Filled 2016-11-14: qty 10

## 2016-11-14 NOTE — Progress Notes (Signed)
Cycle 1 started weekly Taxol/Carbo Q3weeks Cycle 2 - Carbo was held due to low platelets Cycle 3 - Carbo restarted at lower AUC of 4 (was 5)

## 2016-11-14 NOTE — Assessment & Plan Note (Addendum)
#   STAGE IV RECURRENT breast cancer/ TRIPLE NEGATIVE- inflammatory changes right breast/ PET scan shows- bilateral axillary adenopathy; left supraclavicular adenopathy.  # Currently on On carbo q 3 w; taxol weekly. Status post cycle # 2; with interruptions-secondary to cytopenias.  # Slight improvement/Stable- inflammatory area of the breast noted; no worsening lymphadenopathy.  # Proceed with cycle #3 day 1 of carboplatin [Will reduce AUC to 4 with this cycle] with Taxol; start Granix posttreatment. Today white count 3.4 ANC 2.0 hemoglobin 11 platelets normal.lab/ Taxol again in 1 week [8/02].    # follow up with me on 8/09- labs- Taxol [in 2 weeks].

## 2016-11-14 NOTE — Progress Notes (Signed)
[place] OFFICE PROGRESS NOTE  Patient Care Team: Copland, [name], MD (General Surgery) Copland, Ginette Otto as Referring Physician (Physician Assistant) [name], MD (Oncology)  Cancer Staging No matching staging information was found for the patient.   Oncology History   # DEC 2015-  Carcinoma of right breast T1 be N1 M0 tumor stage II diagnosis in December of 2015 (right lower and outer quadrant). Needle biopsy positive of the right, Axillary lymph node (December, 2015), positive for metastatic breast carcinoma. Estrogen receptor negative.  Progesterone receptor weakly positive.  HER-2/neu receptor negative. # JAN 2016- NEO-ADJ CHEMO- Cytoxan, Adriamycin,  followed by Taxol (neoadjuvant therapy) April 25, 2014., 3.finished 4 cycles of chemotherapy with Cytoxan and Adriamycin on 7 th  March, 2016 4.started Taxol on a weekly basis from March 29 5.  Patient will finish weekly Taxol on 13th of June, 2016 6.  Patient had lumpectomy and axillary node evaluation. ypT1b [59m]  ypN1 c (3 /12LN)MO [Dr.Byrnett].  (July, 2016) s/p RT.   # MAY 2018- RECURRENT/ IPSILATERAL BREAST- may 2018- PET- ipsilateral & contralateral axillary/supraclavicular; right breast thickening.   [name]    -------------------------------------------------------------------------- # [employer]- No targets**  7.  Genetic mutation.  No clinically significant mutation identified (December, 2015)  # MELANOMA left neck [Dr.Graham- 2011]     Carcin[place] of lower-outer quadrant of right breast in [place], [place]     INTERVAL HISTORY:  Kiara SLAGEL89y.o.  female  the above history of recurrent/ metastatic ER negative/HER-2/neu negative- currently undergoing chemotherapy with weekly Taxol/carboplatin- is post cycle #2 is here for follow-up.  Patient had multiple  interruptions in her chemotherapy with cycle #2 because of cytopenias. Patient did not get carboplatin with cycle #2; And only received 2 Taxol of the planned 4 weekly treatments.  Patient noted to have initial slight improvement of the breast lesion; however notes to have slight worsening on the lateral aspect of the right breast. She denies any new lumps or bumps. Denies any significant tingling and numbness.  Denies any nausea vomiting. Denies any bone pain.No fever no chills.  REVIEW OF SYSTEMS:  A complete 10 point review of system is done which is negative except mentioned above/history of present illness.   PAST MEDICAL HISTORY :  Past Medical History:  Diagnosis Date  . BRCA negative 03/2014   [facility]  . Breast [place] ([place] 03[place]   Right, T1b, N1 prYpT1b,N1a, ER neg, PR < 10 %, Her 2 neu not overexpressed.   . Cancer ([facility] 03-24-14   Right breast, microcalcifications. ER negative, PR less than 10%, HER-2/neu not amplified.  . Melanoma ([facility] Sept 2011   Left neck T1a, 0.35 mm. [facility]  .Marland KitchenPONV (postoperative nausea and vomiting)    PORT PLACEMENT ONLY    PAST SURGICAL HISTORY :   Past Surgical History:  Procedure Laterality Date  . BREAST BIOPSY Right 03-24-14   +  . BREAST EXCISIONAL BIOPSY Right 10/28/2014   Lumpectomy +  . BREAST LUMPECTOMY WITH SENTINEL LYMPH NODE BIOPSY Right 10/28/2014   Procedure: right breast wide excision with sentinel node biopsy, mastoplasty, axillary dissection ;  Surgeon: JRobert Bellow MD;  Location: [facility] ORS;  Service: General;  Laterality: Right;  . INCISIONAL BREAST BIOPSY Right 09/03/2016   Punch biopsy, invasive mammary carcinoma, Triple negative.   .Marland Kitchen  MELANOMA EXCISION  Nov 2011   neck  . PORT-A-CATH REMOVAL  2017  . [facility] PLACEMENT  Dec 2015  . PORTACATH PLACEMENT N/A 09/24/2016   Procedure: INSERTION PORT-A-CATH;  Surgeon: Robert Bellow, MD;  Location: [facility] ORS;   Service: General;  Laterality: N/A;    FAMILY HISTORY :   Family History  Problem Relation Age of Onset  . Cancer Mother        lung ? mid 24    SOCIAL HISTORY:   Social History  Substance Use Topics  . Smoking status: Never Smoker  . Smokeless tobacco: Never Used  . Alcohol use No    ALLERGIES:  is allergic to sulfa antibiotics.  MEDICATIONS:  Current Outpatient Prescriptions  Medication Sig Dispense Refill  . Misc Natural Products (DANDELION ROOT PO) Take by mouth.    . ondansetron (ZOFRAN) 8 MG tablet Take 8 mg by mouth every 8 (eight) hours as needed for nausea or vomiting.    . TURMERIC PO Take 1,000 mg by mouth.     No current facility-administered medications for this visit.    Facility-Administered Medications Ordered in Other Visits  Medication Dose Route Frequency Provider Last Rate Last Dose  . sodium chloride 0.9 % injection 10 mL  10 mL Intravenous PRN Evlyn Kanner, NP   10 mL at 11/08/14 1207    PHYSICAL EXAMINATION: ECOG PERFORMANCE STATUS: 0 - Asymptomatic  BP 107/71 (BP Location: Left Arm, Patient Position: Sitting)   Pulse 83   Temp 97.6 F (36.4 C) (Tympanic)   Resp 18   Ht 5' 2" (1.575 m)   Wt 116 lb (52.6 kg)   BMI 21.22 kg/m   Filed Weights   11/14/16 1002  Weight: 116 lb (52.6 kg)    GENERAL: Well-nourished well-developed; Alert, no distress and comfortable.   With her husband.  EYES: no pallor or icterus OROPHARYNX: no thrush or ulceration; good dentition  NECK: supple, no masses felt LYMPH:  no palpable lymphadenopathy in the cervical Or inguinal region. Positive for a centimeter sized lymph node in the left axilla. None on the right. LUNGS: clear to auscultation and  No wheeze or crackles HEART/CVS: regular rate & rhythm and no murmurs; No lower extremity edema ABDOMEN:abdomen soft, non-tender and normal bowel sounds Musculoskeletal:no cyanosis of digits and no clubbing  PSYCH: alert & oriented x 3 with fluent speech NEURO:  no focal motor/sensory deficits SKIN:  Patient has raised papules over the right breast; no definite mass noted. Edematous; mild tenderness noted  # image- 09/13/2016.    June 28th 2018.     July 26th 2018     LABORATORY DATA:  I have reviewed the data as listed    Component Value Date/Time   NA 135 11/14/2016 0915   NA 138 08/16/2014 1332   K 3.8 11/14/2016 0915   K 3.7 08/16/2014 1332   CL 103 11/14/2016 0915   CL 106 08/16/2014 1332   CO2 29 11/14/2016 0915   CO2 27 08/16/2014 1332   GLUCOSE 78 11/14/2016 0915   GLUCOSE 118 (H) 08/16/2014 1332   BUN 13 11/14/2016 0915   BUN 9 08/16/2014 1332   CREATININE 0.74 11/14/2016 0915   CREATININE 0.71 08/16/2014 1332   CALCIUM 9.4 11/14/2016 0915   CALCIUM 9.0 08/16/2014 1332   PROT 7.2 11/14/2016 0915   PROT 7.2 08/16/2014 1332   ALBUMIN 4.2 11/14/2016 0915   ALBUMIN 4.4 08/16/2014 1332   AST 27 11/14/2016 0915   AST  28 08/16/2014 1332   ALT 30 11/14/2016 0915   ALT 31 08/16/2014 1332   ALKPHOS 75 11/14/2016 0915   ALKPHOS 45 08/16/2014 1332   BILITOT 0.8 11/14/2016 0915   BILITOT 0.4 08/16/2014 1332   GFRNONAA >60 11/14/2016 0915   GFRNONAA >60 08/16/2014 1332   GFRAA >60 11/14/2016 0915   GFRAA >60 08/16/2014 1332    No results found for: SPEP, UPEP  Lab Results  Component Value Date   WBC 3.4 (L) 11/14/2016   NEUTROABS 2.0 11/14/2016   HGB 11.8 (L) 11/14/2016   HCT 33.9 (L) 11/14/2016   MCV 93.0 11/14/2016   PLT 198 11/14/2016      Chemistry      Component Value Date/Time   NA 135 11/14/2016 0915   NA 138 08/16/2014 1332   K 3.8 11/14/2016 0915   K 3.7 08/16/2014 1332   CL 103 11/14/2016 0915   CL 106 08/16/2014 1332   CO2 29 11/14/2016 0915   CO2 27 08/16/2014 1332   BUN 13 11/14/2016 0915   BUN 9 08/16/2014 1332   CREATININE 0.74 11/14/2016 0915   CREATININE 0.71 08/16/2014 1332      Component Value Date/Time   CALCIUM 9.4 11/14/2016 0915   CALCIUM 9.0 08/16/2014 1332   ALKPHOS 75  11/14/2016 0915   ALKPHOS 45 08/16/2014 1332   AST 27 11/14/2016 0915   AST 28 08/16/2014 1332   ALT 30 11/14/2016 0915   ALT 31 08/16/2014 1332   BILITOT 0.8 11/14/2016 0915   BILITOT 0.4 08/16/2014 1332       RADIOGRAPHIC STUDIES: I have personally reviewed the radiological images as listed and agreed with the findings in the report. No results found.   ASSESSMENT & PLAN:  Carcinoma of lower-outer quadrant of right breast in female, estrogen receptor negative ([place]) # STAGE IV RECURRENT breast cancer/ TRIPLE NEGATIVE- inflammatory changes right breast/ PET scan shows- bilateral axillary adenopathy; left supraclavicular adenopathy.  # Currently on On carbo q 3 w; taxol weekly. Status post cycle # 2; with interruptions-secondary to cytopenias.  # Slight improvement/Stable- inflammatory area of the breast noted; no worsening lymphadenopathy.  # Proceed with cycle #3 day 1 of carboplatin [Will reduce AUC to 4 with this cycle] with Taxol; start Granix posttreatment. Today white count 3.4 ANC 2.0 hemoglobin 11 platelets normal.lab/ Taxol again in 1 week [8/02].    # follow up with me on 8/09- labs- Taxol [in 2 weeks].    Orders Placed This Encounter  Procedures  . CBC with Differential/Platelet    Standing Status:   Future    Standing Expiration Date:   11/14/2017  . Basic metabolic panel    Standing Status:   Future    Standing Expiration Date:   11/14/2017  . Comprehensive metabolic panel    Standing Status:   Future    Standing Expiration Date:   11/14/2017  . CBC with Differential/Platelet    Standing Status:   Future    Standing Expiration Date:   11/14/2017   All questions were answered. The patient knows to call the clinic with any problems, questions or concerns.      Cammie Sickle, MD 11/19/2016 9:25 AM

## 2016-11-15 ENCOUNTER — Inpatient Hospital Stay: Payer: BLUE CROSS/BLUE SHIELD

## 2016-11-15 VITALS — BP 108/68 | HR 84 | Temp 96.8°F | Resp 18

## 2016-11-15 DIAGNOSIS — C50511 Malignant neoplasm of lower-outer quadrant of right female breast: Secondary | ICD-10-CM | POA: Diagnosis not present

## 2016-11-15 DIAGNOSIS — Z171 Estrogen receptor negative status [ER-]: Principal | ICD-10-CM

## 2016-11-15 MED ORDER — TBO-FILGRASTIM 480 MCG/0.8ML ~~LOC~~ SOSY
480.0000 ug | PREFILLED_SYRINGE | Freq: Once | SUBCUTANEOUS | Status: AC
  Administered 2016-11-15: 480 ug via SUBCUTANEOUS

## 2016-11-18 ENCOUNTER — Encounter: Payer: Self-pay | Admitting: Internal Medicine

## 2016-11-18 ENCOUNTER — Inpatient Hospital Stay: Payer: BLUE CROSS/BLUE SHIELD

## 2016-11-18 VITALS — BP 105/59 | HR 128 | Temp 96.7°F | Resp 18

## 2016-11-18 DIAGNOSIS — Z171 Estrogen receptor negative status [ER-]: Principal | ICD-10-CM

## 2016-11-18 DIAGNOSIS — C50511 Malignant neoplasm of lower-outer quadrant of right female breast: Secondary | ICD-10-CM

## 2016-11-18 MED ORDER — TBO-FILGRASTIM 480 MCG/0.8ML ~~LOC~~ SOSY
480.0000 ug | PREFILLED_SYRINGE | Freq: Once | SUBCUTANEOUS | Status: AC
  Administered 2016-11-18: 480 ug via SUBCUTANEOUS

## 2016-11-19 ENCOUNTER — Inpatient Hospital Stay: Payer: BLUE CROSS/BLUE SHIELD

## 2016-11-19 VITALS — BP 111/75 | HR 88 | Temp 98.3°F | Resp 18

## 2016-11-19 DIAGNOSIS — Z171 Estrogen receptor negative status [ER-]: Principal | ICD-10-CM

## 2016-11-19 DIAGNOSIS — C50511 Malignant neoplasm of lower-outer quadrant of right female breast: Secondary | ICD-10-CM | POA: Diagnosis not present

## 2016-11-19 MED ORDER — TBO-FILGRASTIM 480 MCG/0.8ML ~~LOC~~ SOSY
480.0000 ug | PREFILLED_SYRINGE | Freq: Once | SUBCUTANEOUS | Status: AC
  Administered 2016-11-19: 480 ug via SUBCUTANEOUS

## 2016-11-19 NOTE — Patient Instructions (Signed)
Tbo-Filgrastim injection What is this medicine? TBO-FILGRASTIM (T B O fil GRA stim) is a granulocyte colony-stimulating factor that stimulates the growth of neutrophils, a type of white blood cell important in the body's fight against infection. It is used to reduce the incidence of fever and infection in patients with certain types of cancer who are receiving chemotherapy that affects the bone marrow. This medicine may be used for other purposes; ask your health care provider or pharmacist if you have questions. COMMON BRAND NAME(S): Granix What should I tell my health care provider before I take this medicine? They need to know if you have any of these conditions: -bone scan or tests planned -kidney disease -sickle cell anemia -an unusual or allergic reaction to tbo-filgrastim, filgrastim, pegfilgrastim, other medicines, foods, dyes, or preservatives -pregnant or trying to get pregnant -breast-feeding How should I use this medicine? This medicine is for injection under the skin. If you get this medicine at home, you will be taught how to prepare and give this medicine. Refer to the Instructions for Use that come with your medication packaging. Use exactly as directed. Take your medicine at regular intervals. Do not take your medicine more often than directed. It is important that you put your used needles and syringes in a special sharps container. Do not put them in a trash can. If you do not have a sharps container, call your pharmacist or healthcare provider to get one. Talk to your pediatrician regarding the use of this medicine in children. Special care may be needed. Overdosage: If you think you have taken too much of this medicine contact a poison control center or emergency room at once. NOTE: This medicine is only for you. Do not share this medicine with others. What if I miss a dose? It is important not to miss your dose. Call your doctor or health care professional if you miss a  dose. What may interact with this medicine? This medicine may interact with the following medications: -medicines that may cause a release of neutrophils, such as lithium This list may not describe all possible interactions. Give your health care provider a list of all the medicines, herbs, non-prescription drugs, or dietary supplements you use. Also tell them if you smoke, drink alcohol, or use illegal drugs. Some items may interact with your medicine. What should I watch for while using this medicine? You may need blood work done while you are taking this medicine. What side effects may I notice from receiving this medicine? Side effects that you should report to your doctor or health care professional as soon as possible: -allergic reactions like skin rash, itching or hives, swelling of the face, lips, or tongue -blood in the urine -dark urine -dizziness -fast heartbeat -feeling faint -shortness of breath or breathing problems -signs and symptoms of infection like fever or chills; cough; or sore throat -signs and symptoms of kidney injury like trouble passing urine or change in the amount of urine -stomach or side pain, or pain at the shoulder -sweating -swelling of the legs, ankles, or abdomen -tiredness Side effects that usually do not require medical attention (report to your doctor or health care professional if they continue or are bothersome): -bone pain -headache -muscle pain -vomiting This list may not describe all possible side effects. Call your doctor for medical advice about side effects. You may report side effects to FDA at 1-800-FDA-1088. Where should I keep my medicine? Keep out of the reach of children. Store in a refrigerator between   2 and 8 degrees C (36 and 46 degrees F). Keep in carton to protect from light. Throw away this medicine if it is left out of the refrigerator for more than 5 consecutive days. Throw away any unused medicine after the expiration  date. NOTE: This sheet is a summary. It may not cover all possible information. If you have questions about this medicine, talk to your doctor, pharmacist, or health care provider.  2018 Elsevier/Gold Standard (2015-05-29 19:07:04)  

## 2016-11-20 ENCOUNTER — Ambulatory Visit (INDEPENDENT_AMBULATORY_CARE_PROVIDER_SITE_OTHER): Payer: BLUE CROSS/BLUE SHIELD | Admitting: General Surgery

## 2016-11-20 ENCOUNTER — Encounter: Payer: Self-pay | Admitting: General Surgery

## 2016-11-20 VITALS — BP 94/56 | HR 82 | Resp 12 | Ht 64.0 in | Wt 121.0 lb

## 2016-11-20 DIAGNOSIS — C50011 Malignant neoplasm of nipple and areola, right female breast: Secondary | ICD-10-CM | POA: Diagnosis not present

## 2016-11-20 DIAGNOSIS — Z171 Estrogen receptor negative status [ER-]: Secondary | ICD-10-CM

## 2016-11-20 NOTE — Progress Notes (Signed)
Patient ID: Kiara Grant, female   DOB: 13-Jun-1970, 46 y.o.   MRN: [id number]  Chief Complaint  Patient presents with  . Follow-up    HPI Kiara Grant is a 46 y.o. female.  Here for follow up right breast cancer. She is still undergoing treatments, tolerating "fair". She has missed a couple of treatments. Here with her husband, Tammye Kahler.  HPI  Past Medical History:  Diagnosis Date  . BRCA negative 03/2014   Testing completed at [facility]  . Breast cancer ([place]) 03/2014   Right, T1b, N1 prYpT1b,N1a, ER neg, PR < 10 %, Her 2 neu not overexpressed.   . Cancer [facility]) 03-24-14   Right breast, microcalcifications. ER negative, PR less than 10%, HER-2/neu not amplified.  . Melanoma [facility]) Sept 2011   Left neck T1a, 0.35 mm. Treated by Mohs injury [facility].  Marland Kitchen PONV (postoperative nausea and vomiting)    PORT PLACEMENT ONLY    Past Surgical History:  Procedure Laterality Date  . BREAST BIOPSY Right 03-24-14   +  . BREAST EXCISIONAL BIOPSY Right 10/28/2014   Lumpectomy +  . BREAST LUMPECTOMY WITH SENTINEL LYMPH NODE BIOPSY Right 10/28/2014   Procedure: right breast wide excision with sentinel node biopsy, mastoplasty, axillary dissection ;  Surgeon: Robert Bellow, MD;  Location: [facility] ORS;  Service: General;  Laterality: Right;  . INCISIONAL BREAST BIOPSY Right 09/03/2016   Punch biopsy, invasive mammary carcinoma, Triple negative.   Marland Kitchen MELANOMA EXCISION  Nov 2011   neck  . PORT-A-CATH REMOVAL  2017  . [facility] PLACEMENT  Dec 2015  . PORTACATH PLACEMENT N/A 09/24/2016   Procedure: INSERTION PORT-A-CATH;  Surgeon: Robert Bellow, MD;  Location: [facility] ORS;  Service: General;  Laterality: N/A;    Family History  Problem Relation Age of Onset  . Cancer Mother        lung ? mid 14    Social History Social History  Substance Use Topics  . Smoking status: Never Smoker  . Smokeless tobacco: Never Used  . Alcohol use No    Allergies  Allergen Reactions  .  Sulfa Antibiotics Nausea Only    Current Outpatient Prescriptions  Medication Sig Dispense Refill  . Misc Natural Products (DANDELION ROOT PO) Take by mouth.    . ondansetron (ZOFRAN) 8 MG tablet Take 8 mg by mouth every 8 (eight) hours as needed for nausea or vomiting.    . TURMERIC PO Take 1,000 mg by mouth.     No current facility-administered medications for this visit.    Facility-Administered Medications Ordered in Other Visits  Medication Dose Route Frequency Provider Last Rate Last Dose  . sodium chloride 0.9 % injection 10 mL  10 mL Intravenous PRN Evlyn Kanner, NP   10 mL at 11/08/14 1207    Review of Systems Review of Systems  Constitutional: Negative.   Respiratory: Negative.   Cardiovascular: Negative.     Blood pressure (!) 94/56, pulse 82, resp. rate 12, height 5' 4"  (1.626 m), weight 121 lb (54.9 kg).  Physical Exam Physical Exam  Constitutional: She is oriented to person, place, and time. She appears well-developed and well-nourished.  Cardiovascular: Normal rate and regular rhythm.   Pulmonary/Chest: Effort normal and breath sounds normal.    Lymphadenopathy:    She has no axillary adenopathy.       Right: No supraclavicular adenopathy present.       Left: No supraclavicular adenopathy present.  Neurological: She is alert and  oriented to person, place, and time.  Skin: Skin is warm and dry.  Psychiatric: Her behavior is normal.    Data Reviewed Personal discussion with medical oncology.  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    Plan    The patient will be considered for toilet mastectomy to minimize the chance of breakthrough disease and ulceration of the skin. The defect will be fairly sizable and previously radiated tissue. I think she'll benefit from likely rotation flap coverage.  Arrangements will be made for evaluation by Audelia Hives, D.O., from plastic surgery.  The patient will likely have a  repeat PET scan 3 weeks and will make a decision regarding surgical intervention after that study is complete.      HPI, Physical Exam, Assessment and Plan have been scribed under the direction and in the presence of Robert Bellow, MD. Karie Fetch, RN  I have completed the exam and reviewed the above documentation for accuracy and completeness.  I agree with the above.  Haematologist has been used and any errors in dictation or transcription are unintentional.  Hervey Ard, M.D., F.A.C.S.    Robert Bellow 11/21/2016, 7:43 PM

## 2016-11-20 NOTE — Patient Instructions (Signed)
The patient is aware to call back for any questions or concerns.  

## 2016-11-21 ENCOUNTER — Inpatient Hospital Stay: Payer: BLUE CROSS/BLUE SHIELD

## 2016-11-21 ENCOUNTER — Inpatient Hospital Stay: Payer: BLUE CROSS/BLUE SHIELD | Attending: Internal Medicine

## 2016-11-21 VITALS — BP 102/67 | HR 85 | Temp 97.4°F | Resp 18

## 2016-11-21 DIAGNOSIS — Z923 Personal history of irradiation: Secondary | ICD-10-CM | POA: Insufficient documentation

## 2016-11-21 DIAGNOSIS — Z171 Estrogen receptor negative status [ER-]: Secondary | ICD-10-CM | POA: Diagnosis not present

## 2016-11-21 DIAGNOSIS — Z5111 Encounter for antineoplastic chemotherapy: Secondary | ICD-10-CM | POA: Insufficient documentation

## 2016-11-21 DIAGNOSIS — C50511 Malignant neoplasm of lower-outer quadrant of right female breast: Secondary | ICD-10-CM | POA: Insufficient documentation

## 2016-11-21 DIAGNOSIS — Z85820 Personal history of malignant melanoma of skin: Secondary | ICD-10-CM | POA: Insufficient documentation

## 2016-11-21 DIAGNOSIS — Z79899 Other long term (current) drug therapy: Secondary | ICD-10-CM | POA: Diagnosis not present

## 2016-11-21 DIAGNOSIS — R590 Localized enlarged lymph nodes: Secondary | ICD-10-CM | POA: Insufficient documentation

## 2016-11-21 LAB — COMPREHENSIVE METABOLIC PANEL
ALBUMIN: 4.2 g/dL (ref 3.5–5.0)
ALT: 26 U/L (ref 14–54)
AST: 26 U/L (ref 15–41)
Alkaline Phosphatase: 82 U/L (ref 38–126)
Anion gap: 5 (ref 5–15)
BILIRUBIN TOTAL: 0.4 mg/dL (ref 0.3–1.2)
BUN: 15 mg/dL (ref 6–20)
CHLORIDE: 102 mmol/L (ref 101–111)
CO2: 29 mmol/L (ref 22–32)
Calcium: 9.1 mg/dL (ref 8.9–10.3)
Creatinine, Ser: 0.73 mg/dL (ref 0.44–1.00)
GFR calc Af Amer: >60 mL/min (ref >60)
GFR calc non Af Amer: >60 mL/min (ref >60)
GLUCOSE: 93 mg/dL (ref 65–99)
POTASSIUM: 4.1 mmol/L (ref 3.5–5.1)
Sodium: 136 mmol/L (ref 135–145)
Total Protein: 6.9 g/dL (ref 6.5–8.1)

## 2016-11-21 LAB — CBC WITH DIFFERENTIAL/PLATELET
BASOS ABS: 0.1 10*3/uL (ref 0–0.1)
BASOS PCT: 2 %
Eosinophils Absolute: 0.1 10*3/uL (ref 0–0.7)
Eosinophils Relative: 2 %
HEMATOCRIT: 30.4 % — AB (ref 35.0–47.0)
Hemoglobin: 10.6 g/dL — ABNORMAL LOW (ref 12.0–16.0)
Lymphocytes Relative: 17 %
Lymphs Abs: 1.1 10*3/uL (ref 1.0–3.6)
MCH: 33.2 pg (ref 26.0–34.0)
MCHC: 35 g/dL (ref 32.0–36.0)
MCV: 94.8 fL (ref 80.0–100.0)
MONO ABS: 0.7 10*3/uL (ref 0.2–0.9)
Monocytes Relative: 11 %
NEUTROS ABS: 4.5 10*3/uL (ref 1.4–6.5)
NEUTROS PCT: 68 %
PLATELETS: 219 10*3/uL (ref 150–440)
RBC: 3.2 MIL/uL — ABNORMAL LOW (ref 3.80–5.20)
RDW: 17.3 % — AB (ref 11.5–14.5)
WBC: 6.6 10*3/uL (ref 3.6–11.0)

## 2016-11-21 MED ORDER — DEXAMETHASONE SODIUM PHOSPHATE 10 MG/ML IJ SOLN
10.0000 mg | Freq: Once | INTRAMUSCULAR | Status: AC
  Administered 2016-11-21: 10 mg via INTRAVENOUS
  Filled 2016-11-21: qty 1

## 2016-11-21 MED ORDER — SODIUM CHLORIDE 0.9% FLUSH
10.0000 mL | Freq: Once | INTRAVENOUS | Status: AC
  Administered 2016-11-21: 10 mL via INTRAVENOUS
  Filled 2016-11-21: qty 10

## 2016-11-21 MED ORDER — SODIUM CHLORIDE 0.9 % IV SOLN
80.0000 mg/m2 | Freq: Once | INTRAVENOUS | Status: AC
  Administered 2016-11-21: 126 mg via INTRAVENOUS
  Filled 2016-11-21: qty 21

## 2016-11-21 MED ORDER — HEPARIN SOD (PORK) LOCK FLUSH 100 UNIT/ML IV SOLN
500.0000 [IU] | Freq: Once | INTRAVENOUS | Status: AC
  Administered 2016-11-21: 500 [IU] via INTRAVENOUS

## 2016-11-21 MED ORDER — DIPHENHYDRAMINE HCL 50 MG/ML IJ SOLN
50.0000 mg | Freq: Once | INTRAMUSCULAR | Status: AC
  Administered 2016-11-21: 50 mg via INTRAVENOUS
  Filled 2016-11-21: qty 1

## 2016-11-21 MED ORDER — FAMOTIDINE IN NACL 20-0.9 MG/50ML-% IV SOLN
20.0000 mg | Freq: Once | INTRAVENOUS | Status: AC
  Administered 2016-11-21: 20 mg via INTRAVENOUS
  Filled 2016-11-21: qty 50

## 2016-11-21 MED ORDER — SODIUM CHLORIDE 0.9 % IV SOLN
Freq: Once | INTRAVENOUS | Status: AC
  Administered 2016-11-21: 10:00:00 via INTRAVENOUS
  Filled 2016-11-21: qty 1000

## 2016-11-22 ENCOUNTER — Telehealth: Payer: Self-pay | Admitting: Internal Medicine

## 2016-11-22 NOTE — Telephone Encounter (Signed)
msg left for patient regarding plan. Also sent msg to scheduling team to arrange for additional appointments.

## 2016-11-22 NOTE — Telephone Encounter (Signed)
Recommend Granix q day- Monday & Tuesday [6th & 7th of august]. Please inform pt.

## 2016-11-25 ENCOUNTER — Other Ambulatory Visit: Payer: Self-pay | Admitting: *Deleted

## 2016-11-25 ENCOUNTER — Encounter: Payer: Self-pay | Admitting: *Deleted

## 2016-11-25 ENCOUNTER — Telehealth: Payer: Self-pay | Admitting: *Deleted

## 2016-11-25 ENCOUNTER — Inpatient Hospital Stay: Payer: BLUE CROSS/BLUE SHIELD

## 2016-11-25 VITALS — BP 89/57 | HR 88 | Temp 98.1°F | Resp 18

## 2016-11-25 DIAGNOSIS — C50511 Malignant neoplasm of lower-outer quadrant of right female breast: Secondary | ICD-10-CM | POA: Diagnosis not present

## 2016-11-25 DIAGNOSIS — Z171 Estrogen receptor negative status [ER-]: Principal | ICD-10-CM

## 2016-11-25 DIAGNOSIS — C50011 Malignant neoplasm of nipple and areola, right female breast: Secondary | ICD-10-CM

## 2016-11-25 MED ORDER — TBO-FILGRASTIM 480 MCG/0.8ML ~~LOC~~ SOSY
480.0000 ug | PREFILLED_SYRINGE | Freq: Once | SUBCUTANEOUS | Status: AC
  Administered 2016-11-25: 480 ug via SUBCUTANEOUS

## 2016-11-25 NOTE — Telephone Encounter (Signed)
-----   Message from Robert Bellow, MD sent at 11/21/2016  7:40 PM EDT ----- Please arrange for the patient to be seen by Audelia Hives, D.O. in [place] regarding a potential rotation flap coverage of the mastectomy site. The patient is out of town until Wednesday. Thank you

## 2016-11-25 NOTE — Telephone Encounter (Signed)
Patient has been scheduled for an appointment with Dr. Audelia Hives on 12-10-16 at 10:15 am (arrive 10 am).   My Chart message will be sent to patient with date and time of appointment since she is out of town till Wednesday.

## 2016-11-26 ENCOUNTER — Inpatient Hospital Stay: Payer: BLUE CROSS/BLUE SHIELD

## 2016-11-26 VITALS — BP 101/69 | HR 90 | Temp 97.6°F | Resp 17

## 2016-11-26 DIAGNOSIS — C50511 Malignant neoplasm of lower-outer quadrant of right female breast: Secondary | ICD-10-CM | POA: Diagnosis not present

## 2016-11-26 DIAGNOSIS — Z171 Estrogen receptor negative status [ER-]: Principal | ICD-10-CM

## 2016-11-26 MED ORDER — TBO-FILGRASTIM 480 MCG/0.8ML ~~LOC~~ SOSY
480.0000 ug | PREFILLED_SYRINGE | Freq: Once | SUBCUTANEOUS | Status: AC
  Administered 2016-11-26: 480 ug via SUBCUTANEOUS

## 2016-11-27 ENCOUNTER — Ambulatory Visit: Payer: BLUE CROSS/BLUE SHIELD

## 2016-11-28 ENCOUNTER — Inpatient Hospital Stay: Payer: BLUE CROSS/BLUE SHIELD

## 2016-11-28 ENCOUNTER — Inpatient Hospital Stay (HOSPITAL_BASED_OUTPATIENT_CLINIC_OR_DEPARTMENT_OTHER): Payer: BLUE CROSS/BLUE SHIELD | Admitting: Internal Medicine

## 2016-11-28 VITALS — BP 104/72 | HR 81 | Temp 97.6°F | Resp 18 | Ht 64.0 in | Wt 121.2 lb

## 2016-11-28 DIAGNOSIS — C50511 Malignant neoplasm of lower-outer quadrant of right female breast: Secondary | ICD-10-CM

## 2016-11-28 DIAGNOSIS — R590 Localized enlarged lymph nodes: Secondary | ICD-10-CM | POA: Diagnosis not present

## 2016-11-28 DIAGNOSIS — Z171 Estrogen receptor negative status [ER-]: Principal | ICD-10-CM

## 2016-11-28 DIAGNOSIS — Z79899 Other long term (current) drug therapy: Secondary | ICD-10-CM | POA: Diagnosis not present

## 2016-11-28 LAB — COMPREHENSIVE METABOLIC PANEL
ALT: 29 U/L (ref 14–54)
ANION GAP: 8 (ref 5–15)
AST: 26 U/L (ref 15–41)
Albumin: 4 g/dL (ref 3.5–5.0)
Alkaline Phosphatase: 81 U/L (ref 38–126)
BILIRUBIN TOTAL: 0.3 mg/dL (ref 0.3–1.2)
BUN: 13 mg/dL (ref 6–20)
CHLORIDE: 104 mmol/L (ref 101–111)
CO2: 26 mmol/L (ref 22–32)
Calcium: 9 mg/dL (ref 8.9–10.3)
Creatinine, Ser: 0.64 mg/dL (ref 0.44–1.00)
GFR calc Af Amer: >60 mL/min (ref >60)
Glucose, Bld: 95 mg/dL (ref 65–99)
POTASSIUM: 3.7 mmol/L (ref 3.5–5.1)
Sodium: 138 mmol/L (ref 135–145)
TOTAL PROTEIN: 6.7 g/dL (ref 6.5–8.1)

## 2016-11-28 LAB — CBC WITH DIFFERENTIAL/PLATELET
BASOS ABS: 0.1 10*3/uL (ref 0–0.1)
BASOS PCT: 1 %
EOS PCT: 1 %
Eosinophils Absolute: 0.1 10*3/uL (ref 0–0.7)
HCT: 27.9 % — ABNORMAL LOW (ref 35.0–47.0)
Hemoglobin: 9.6 g/dL — ABNORMAL LOW (ref 12.0–16.0)
Lymphocytes Relative: 8 %
Lymphs Abs: 1.1 10*3/uL (ref 1.0–3.6)
MCH: 33.1 pg (ref 26.0–34.0)
MCHC: 34.4 g/dL (ref 32.0–36.0)
MCV: 96.1 fL (ref 80.0–100.0)
MONO ABS: 0.6 10*3/uL (ref 0.2–0.9)
MONOS PCT: 4 %
Neutro Abs: 12.1 10*3/uL — ABNORMAL HIGH (ref 1.4–6.5)
Neutrophils Relative %: 86 %
PLATELETS: 131 10*3/uL — AB (ref 150–440)
RBC: 2.9 MIL/uL — ABNORMAL LOW (ref 3.80–5.20)
RDW: 17.3 % — AB (ref 11.5–14.5)
WBC: 14 10*3/uL — ABNORMAL HIGH (ref 3.6–11.0)

## 2016-11-28 MED ORDER — HEPARIN SOD (PORK) LOCK FLUSH 100 UNIT/ML IV SOLN
500.0000 [IU] | Freq: Once | INTRAVENOUS | Status: AC
  Administered 2016-11-28: 500 [IU] via INTRAVENOUS
  Filled 2016-11-28: qty 5

## 2016-11-28 MED ORDER — DIPHENHYDRAMINE HCL 50 MG/ML IJ SOLN
50.0000 mg | Freq: Once | INTRAMUSCULAR | Status: AC
  Administered 2016-11-28: 50 mg via INTRAVENOUS
  Filled 2016-11-28: qty 1

## 2016-11-28 MED ORDER — DEXAMETHASONE SODIUM PHOSPHATE 10 MG/ML IJ SOLN
10.0000 mg | Freq: Once | INTRAMUSCULAR | Status: AC
  Administered 2016-11-28: 10 mg via INTRAVENOUS
  Filled 2016-11-28: qty 1

## 2016-11-28 MED ORDER — FAMOTIDINE IN NACL 20-0.9 MG/50ML-% IV SOLN
20.0000 mg | Freq: Once | INTRAVENOUS | Status: AC
  Administered 2016-11-28: 20 mg via INTRAVENOUS
  Filled 2016-11-28: qty 50

## 2016-11-28 MED ORDER — PACLITAXEL CHEMO INJECTION 300 MG/50ML
80.0000 mg/m2 | Freq: Once | INTRAVENOUS | Status: AC
  Administered 2016-11-28: 126 mg via INTRAVENOUS
  Filled 2016-11-28: qty 21

## 2016-11-28 MED ORDER — SODIUM CHLORIDE 0.9% FLUSH
10.0000 mL | INTRAVENOUS | Status: DC | PRN
Start: 2016-11-28 — End: 2016-11-28
  Administered 2016-11-28: 10 mL via INTRAVENOUS
  Filled 2016-11-28: qty 10

## 2016-11-28 MED ORDER — SODIUM CHLORIDE 0.9 % IV SOLN
Freq: Once | INTRAVENOUS | Status: AC
  Administered 2016-11-28: 10:00:00 via INTRAVENOUS
  Filled 2016-11-28: qty 1000

## 2016-11-28 MED ORDER — DEXAMETHASONE SODIUM PHOSPHATE 10 MG/ML IJ SOLN: 10.0000 mg | Freq: Once | INTRAMUSCULAR | Status: DC

## 2016-11-28 MED ORDER — SODIUM CHLORIDE 0.9 % IV SOLN: 10.0000 mg | Freq: Once | INTRAVENOUS | Status: DC

## 2016-11-28 NOTE — Assessment & Plan Note (Addendum)
#   STAGE IV RECURRENT breast cancer/ TRIPLE NEGATIVE- inflammatory changes right breast/ PET scan shows- bilateral axillary adenopathy; left supraclavicular adenopathy. Patient currently status post cycle #3 day 8 chemotherapy. Patient able to tolerate chemotherapy better with Granix.  # Slight improvement/Stable- inflammatory area of the breast noted; no worsening lymphadenopathy. Clinically it felt patient had a better response while on carboplatin. I would recommend one more cycle of carboplatin chemotherapy- prior to proceeding with surgery.  Discussed with Dr.Byrnett. Would recommend a PET scan prior to next visit. Reviewed with patient- Foundation 1 shows no evidence of any targetable mutations.  # Proceed with cycle #3 day 15 of with Taxol; 12 [s/p granix] ;hemoglobin 9.5 platelets- 120s.     # follow up with me on 8/23rd- labs- carbo-Taxol [in 2 weeks]; labs-cbc in 1 week ; granix [16/17th/20/21st].

## 2016-11-28 NOTE — Progress Notes (Signed)
[facility] OFFICE PROGRESS NOTE  Patient Care Team: Copland, [name], MD (General Surgery) Copland, Ginette Otto as Referring Physician (Physician Assistant) [name], MD (Oncology)  Cancer Staging No matching staging information was found for the patient.   Oncology History   # DEC 2015-  Carcinoma of right breast T1 be N1 M0 tumor stage II diagnosis in December of 2015 (right lower and outer quadrant). Needle biopsy positive of the right, Axillary lymph node (December, 2015), positive for metastatic breast carcinoma. Estrogen receptor negative.  Progesterone receptor weakly positive.  HER-2/neu receptor negative. # JAN 2016- NEO-ADJ CHEMO- Cytoxan, Adriamycin,  followed by Taxol (neoadjuvant therapy) April 25, 2014., 3.finished 4 cycles of chemotherapy with Cytoxan and Adriamycin on 7 th  March, 2016 4.started Taxol on a weekly basis from March 29 5.  Patient will finish weekly Taxol on 13th of June, 2016 6.  Patient had lumpectomy and axillary node evaluation. ypT1b [35m]  ypN1 c (3 /12LN)MO [Dr.Byrnett].  (July, 2016) s/p RT.   # MAY 2018- RECURRENT/ IPSILATERAL BREAST- may 2018- PET- ipsilateral & contralateral axillary/supraclavicular; right breast thickening.   [name]    -------------------------------------------------------------------------- # [facility]- No targets**  7.  Genetic mutation.  No clinically significant mutation identified (December, 2015)  # MELANOMA left neck [Dr.Graham- 2011]     Carcin[place] of lower-outer quadrant of right breast in [place], [place]     INTERVAL HISTORY:  Kiara WINECOFF89y.o.  female  the above history of recurrent/ metastatic ER negative/HER-2/neu negative- currently undergoing chemotherapy with weekly Taxol/carboplatin- is post cycle #3 day- 8 is here for follow-up.  In the interim  patient was evaluated by surgery- recommended palliative mastectomy; also Recommended follow-up with plastic surgery.  Patient noted to have initial slight improvement of the breast lesion; She denies any new lumps or bumps. Denies any significant tingling and numbness. Denies any nausea vomiting. Denies any bone pain. No fever no chills.  REVIEW OF SYSTEMS:  A complete 10 point review of system is done which is negative except mentioned above/history of present illness.   PAST MEDICAL HISTORY :  Past Medical History:  Diagnosis Date  . BRCA negative 03/2014   [facility]  . Breast [place]   Right, T1b, N1 prYpT1b,N1a, ER neg, PR < 10 %, Her 2 neu not overexpressed.   . Cancer ([facility] 03-24-14   Right breast, microcalcifications. ER negative, PR less than 10%, HER-2/neu not amplified.  . Melanoma ([facility] Sept 2011   Left neck T1a, 0.35 mm. [facility]  .Marland KitchenPONV (postoperative nausea and vomiting)    PORT PLACEMENT ONLY    PAST SURGICAL HISTORY :   Past Surgical History:  Procedure Laterality Date  . BREAST BIOPSY Right 03-24-14   +  . BREAST EXCISIONAL BIOPSY Right 10/28/2014   Lumpectomy +  . BREAST LUMPECTOMY WITH SENTINEL LYMPH NODE BIOPSY Right 10/28/2014   Procedure: right breast wide excision with sentinel node biopsy, mastoplasty, axillary dissection ;  Surgeon: JRobert Bellow MD;  Location: [facility] ORS;  Service: General;  Laterality: Right;  . INCISIONAL BREAST BIOPSY Right 09/03/2016   Punch biopsy, invasive mammary carcinoma, Triple negative.   .Marland KitchenMELANOMA EXCISION  Nov 2011   neck  . PORT-A-CATH REMOVAL  2017  [facility]  . PORTACATH PLACEMENT N/A 09/24/2016  Procedure: INSERTION PORT-A-CATH;  Surgeon: Robert Bellow, MD;  Location: [facility] ORS;  Service: General;  Laterality: N/A;    FAMILY HISTORY :   Family History  Problem Relation Age of Onset  . Cancer Mother        lung ? mid 8     SOCIAL HISTORY:   Social History  Substance Use Topics  . Smoking status: Never Smoker  . Smokeless tobacco: Never Used  . Alcohol use No    ALLERGIES:  is allergic to sulfa antibiotics.  MEDICATIONS:  Current Outpatient Prescriptions  Medication Sig Dispense Refill  . loratadine (CLARITIN) 10 MG tablet Take 10 mg by mouth daily as needed (after onpro injections).    . Misc Natural Products (DANDELION ROOT PO) Take by mouth.    . ondansetron (ZOFRAN) 8 MG tablet Take 8 mg by mouth every 8 (eight) hours as needed for nausea or vomiting.    . TURMERIC PO Take 1,000 mg by mouth.     No current facility-administered medications for this visit.    Facility-Administered Medications Ordered in Other Visits  Medication Dose Route Frequency Provider Last Rate Last Dose  . sodium chloride 0.9 % injection 10 mL  10 mL Intravenous PRN Evlyn Kanner, NP   10 mL at 11/08/14 1207    PHYSICAL EXAMINATION: ECOG PERFORMANCE STATUS: 0 - Asymptomatic  BP 104/72   Pulse 81   Temp 97.6 F (36.4 C) (Tympanic)   Resp 18   Ht 5' 4"  (1.626 m)   Wt 121 lb 3.2 oz (55 kg)   BMI 20.80 kg/m   Filed Weights   11/28/16 0855  Weight: 121 lb 3.2 oz (55 kg)    GENERAL: Well-nourished well-developed; Alert, no distress and comfortable.   With her husband.  EYES: no pallor or icterus OROPHARYNX: no thrush or ulceration; good dentition  NECK: supple, no masses felt LYMPH:  no palpable lymphadenopathy in the cervical Or inguinal region. Positive for a centimeter sized lymph node in the left axilla. None on the right. LUNGS: clear to auscultation and  No wheeze or crackles HEART/CVS: regular rate & rhythm and no murmurs; No lower extremity edema ABDOMEN:abdomen soft, non-tender and normal bowel sounds Musculoskeletal:no cyanosis of digits and no clubbing  PSYCH: alert & oriented x 3 with fluent speech NEURO: no focal motor/sensory deficits SKIN:  Patient has raised papules over the right  breast; no definite mass noted. Edematous; mild tenderness noted  # image- 09/13/2016.    June 28th 2018.     July 26th 2018     LABORATORY DATA:  I have reviewed the data as listed    Component Value Date/Time   NA 138 11/28/2016 0834   NA 138 08/16/2014 1332   K 3.7 11/28/2016 0834   K 3.7 08/16/2014 1332   CL 104 11/28/2016 0834   CL 106 08/16/2014 1332   CO2 26 11/28/2016 0834   CO2 27 08/16/2014 1332   GLUCOSE 95 11/28/2016 0834   GLUCOSE 118 (H) 08/16/2014 1332   BUN 13 11/28/2016 0834   BUN 9 08/16/2014 1332   CREATININE 0.64 11/28/2016 0834   CREATININE 0.71 08/16/2014 1332   CALCIUM 9.0 11/28/2016 0834   CALCIUM 9.0 08/16/2014 1332   PROT 6.7 11/28/2016 0834   PROT 7.2 08/16/2014 1332   ALBUMIN 4.0 11/28/2016 0834   ALBUMIN 4.4 08/16/2014 1332   AST 26 11/28/2016 0834   AST 28 08/16/2014 1332   ALT 29 11/28/2016 0834   ALT  31 08/16/2014 1332   ALKPHOS 81 11/28/2016 0834   ALKPHOS 45 08/16/2014 1332   BILITOT 0.3 11/28/2016 0834   BILITOT 0.4 08/16/2014 1332   GFRNONAA >60 11/28/2016 0834   GFRNONAA >60 08/16/2014 1332   GFRAA >60 11/28/2016 0834   GFRAA >60 08/16/2014 1332    No results found for: SPEP, UPEP  Lab Results  Component Value Date   WBC 14.0 (H) 11/28/2016   NEUTROABS 12.1 (H) 11/28/2016   HGB 9.6 (L) 11/28/2016   HCT 27.9 (L) 11/28/2016   MCV 96.1 11/28/2016   PLT 131 (L) 11/28/2016      Chemistry      Component Value Date/Time   NA 138 11/28/2016 0834   NA 138 08/16/2014 1332   K 3.7 11/28/2016 0834   K 3.7 08/16/2014 1332   CL 104 11/28/2016 0834   CL 106 08/16/2014 1332   CO2 26 11/28/2016 0834   CO2 27 08/16/2014 1332   BUN 13 11/28/2016 0834   BUN 9 08/16/2014 1332   CREATININE 0.64 11/28/2016 0834   CREATININE 0.71 08/16/2014 1332      Component Value Date/Time   CALCIUM 9.0 11/28/2016 0834   CALCIUM 9.0 08/16/2014 1332   ALKPHOS 81 11/28/2016 0834   ALKPHOS 45 08/16/2014 1332   AST 26 11/28/2016 0834    AST 28 08/16/2014 1332   ALT 29 11/28/2016 0834   ALT 31 08/16/2014 1332   BILITOT 0.3 11/28/2016 0834   BILITOT 0.4 08/16/2014 1332       RADIOGRAPHIC STUDIES: I have personally reviewed the radiological images as listed and agreed with the findings in the report. No results found.   ASSESSMENT & PLAN:  Carcinoma of lower-outer quadrant of right breast in female, estrogen receptor negative ([place]) # STAGE IV RECURRENT breast cancer/ TRIPLE NEGATIVE- inflammatory changes right breast/ PET scan shows- bilateral axillary adenopathy; left supraclavicular adenopathy. Patient currently status post cycle #3 day 8 chemotherapy. Patient able to tolerate chemotherapy better with Granix.  # Slight improvement/Stable- inflammatory area of the breast noted; no worsening lymphadenopathy. Clinically it felt patient had a better response while on carboplatin. I would recommend one more cycle of carboplatin chemotherapy- prior to proceeding with surgery.  Discussed with Dr.Byrnett. Would recommend a PET scan prior to next visit. Reviewed with patient- Foundation 1 shows no evidence of any targetable mutations.  # Proceed with cycle #3 day 15 of with Taxol; 12 [s/p granix] ;hemoglobin 9.5 platelets- 120s.     # follow up with me on 8/23rd- labs- carbo-Taxol [in 2 weeks]; labs-cbc in 1 week ; granix [16/17th/20/21st].   Orders Placed This Encounter  Procedures  . NM PET Image Restag (PS) Skull Base To Thigh    Standing Status:   Future    Standing Expiration Date:   11/28/2017    Order Specific Question:   If indicated for the ordered procedure, I authorize the administration of a radiopharmaceutical per Radiology protocol    Answer:   Yes    Order Specific Question:   Is the patient pregnant?    Answer:   No    Order Specific Question:   Preferred imaging location?    Answer:   [facility]    Order Specific Question:   Radiology Contrast Protocol - do NOT remove file path    Answer:    [email or web address]   All questions were answered. The patient knows to call the clinic with any problems, questions or concerns.  Cammie Sickle, MD 12/01/2016 9:47 PM

## 2016-12-05 ENCOUNTER — Inpatient Hospital Stay: Payer: BLUE CROSS/BLUE SHIELD

## 2016-12-05 ENCOUNTER — Ambulatory Visit: Admission: RE | Admit: 2016-12-05 | Payer: BLUE CROSS/BLUE SHIELD | Source: Ambulatory Visit

## 2016-12-05 ENCOUNTER — Other Ambulatory Visit: Payer: Self-pay | Admitting: Internal Medicine

## 2016-12-05 ENCOUNTER — Ambulatory Visit: Payer: BLUE CROSS/BLUE SHIELD

## 2016-12-05 VITALS — BP 108/69 | HR 99 | Temp 97.0°F | Resp 16

## 2016-12-05 DIAGNOSIS — Z171 Estrogen receptor negative status [ER-]: Principal | ICD-10-CM

## 2016-12-05 DIAGNOSIS — C50511 Malignant neoplasm of lower-outer quadrant of right female breast: Secondary | ICD-10-CM

## 2016-12-05 LAB — CBC WITH DIFFERENTIAL/PLATELET
Basophils Absolute: 0.1 10*3/uL (ref 0–0.1)
Basophils Relative: 3 %
EOS PCT: 2 %
Eosinophils Absolute: 0 10*3/uL (ref 0–0.7)
HCT: 31.2 % — ABNORMAL LOW (ref 35.0–47.0)
HEMOGLOBIN: 10.7 g/dL — AB (ref 12.0–16.0)
LYMPHS ABS: 0.8 10*3/uL — AB (ref 1.0–3.6)
LYMPHS PCT: 43 %
MCH: 33.4 pg (ref 26.0–34.0)
MCHC: 34.4 g/dL (ref 32.0–36.0)
MCV: 97 fL (ref 80.0–100.0)
Monocytes Absolute: 0.2 10*3/uL (ref 0.2–0.9)
Monocytes Relative: 11 %
Neutro Abs: 0.8 10*3/uL — ABNORMAL LOW (ref 1.4–6.5)
Neutrophils Relative %: 41 %
PLATELETS: 120 10*3/uL — AB (ref 150–440)
RBC: 3.22 MIL/uL — AB (ref 3.80–5.20)
RDW: 17 % — ABNORMAL HIGH (ref 11.5–14.5)
WBC: 1.8 10*3/uL — AB (ref 3.6–11.0)

## 2016-12-05 LAB — COMPREHENSIVE METABOLIC PANEL
ALK PHOS: 70 U/L (ref 38–126)
ALT: 23 U/L (ref 14–54)
AST: 22 U/L (ref 15–41)
Albumin: 4.5 g/dL (ref 3.5–5.0)
Anion gap: 6 (ref 5–15)
BILIRUBIN TOTAL: 0.8 mg/dL (ref 0.3–1.2)
BUN: 16 mg/dL (ref 6–20)
CALCIUM: 9.2 mg/dL (ref 8.9–10.3)
CO2: 28 mmol/L (ref 22–32)
CREATININE: 0.74 mg/dL (ref 0.44–1.00)
Chloride: 100 mmol/L — ABNORMAL LOW (ref 101–111)
Glucose, Bld: 84 mg/dL (ref 65–99)
Potassium: 3.9 mmol/L (ref 3.5–5.1)
Sodium: 134 mmol/L — ABNORMAL LOW (ref 135–145)
TOTAL PROTEIN: 7.8 g/dL (ref 6.5–8.1)

## 2016-12-05 MED ORDER — TBO-FILGRASTIM 480 MCG/0.8ML ~~LOC~~ SOSY
480.0000 ug | PREFILLED_SYRINGE | Freq: Once | SUBCUTANEOUS | Status: AC
  Administered 2016-12-05: 480 ug via SUBCUTANEOUS

## 2016-12-06 ENCOUNTER — Other Ambulatory Visit: Payer: BLUE CROSS/BLUE SHIELD

## 2016-12-06 ENCOUNTER — Encounter: Payer: Self-pay | Admitting: Internal Medicine

## 2016-12-06 ENCOUNTER — Telehealth: Payer: Self-pay | Admitting: *Deleted

## 2016-12-06 ENCOUNTER — Inpatient Hospital Stay: Payer: BLUE CROSS/BLUE SHIELD

## 2016-12-06 DIAGNOSIS — Z171 Estrogen receptor negative status [ER-]: Principal | ICD-10-CM

## 2016-12-06 DIAGNOSIS — C50511 Malignant neoplasm of lower-outer quadrant of right female breast: Secondary | ICD-10-CM | POA: Diagnosis not present

## 2016-12-06 MED ORDER — TBO-FILGRASTIM 480 MCG/0.8ML ~~LOC~~ SOSY
480.0000 ug | PREFILLED_SYRINGE | Freq: Once | SUBCUTANEOUS | Status: AC
  Administered 2016-12-06: 480 ug via SUBCUTANEOUS

## 2016-12-06 MED ORDER — TBO-FILGRASTIM 480 MCG/0.8ML ~~LOC~~ SOSY
PREFILLED_SYRINGE | SUBCUTANEOUS | Status: AC
  Filled 2016-12-06: qty 0.8

## 2016-12-06 NOTE — Telephone Encounter (Signed)
-----   Message from Loa Socks sent at 12/06/2016 10:28 AM EDT ----- Regarding: PET denied Per [employer] PET request denied, for review please call [phone].  TY

## 2016-12-06 NOTE — Telephone Encounter (Signed)
I had to leave a vm for the nurse. She called me back an hour ago. Spoke with Doroteo Bradford. She stated that I could not perform a peer to peer. At this time, we had to appeal the denial and I had to fax records to the appeal team at [phone].  I did this per insurance request. She gave me her phone number if I needed to call her back at contact: [phone]

## 2016-12-06 NOTE — Telephone Encounter (Deleted)
-----   Message from Loa Socks sent at 12/06/2016 10:28 AM EDT ----- Regarding: PET denied Per [employer]IM/[employer]BS PET request denied, for review please call [phone].  TY

## 2016-12-09 ENCOUNTER — Encounter: Payer: Self-pay | Admitting: *Deleted

## 2016-12-09 ENCOUNTER — Ambulatory Visit: Payer: BLUE CROSS/BLUE SHIELD

## 2016-12-09 ENCOUNTER — Other Ambulatory Visit: Payer: Self-pay | Admitting: *Deleted

## 2016-12-09 ENCOUNTER — Inpatient Hospital Stay: Payer: BLUE CROSS/BLUE SHIELD

## 2016-12-09 VITALS — BP 104/67 | HR 69 | Temp 96.9°F | Resp 17

## 2016-12-09 DIAGNOSIS — Z171 Estrogen receptor negative status [ER-]: Principal | ICD-10-CM

## 2016-12-09 DIAGNOSIS — C50511 Malignant neoplasm of lower-outer quadrant of right female breast: Secondary | ICD-10-CM | POA: Diagnosis not present

## 2016-12-09 MED ORDER — TBO-FILGRASTIM 480 MCG/0.8ML ~~LOC~~ SOSY
480.0000 ug | PREFILLED_SYRINGE | Freq: Once | SUBCUTANEOUS | Status: AC
  Administered 2016-12-09: 480 ug via SUBCUTANEOUS

## 2016-12-09 MED ORDER — TBO-FILGRASTIM 480 MCG/0.8ML ~~LOC~~ SOSY
PREFILLED_SYRINGE | SUBCUTANEOUS | Status: AC
  Filled 2016-12-09: qty 0.8

## 2016-12-10 ENCOUNTER — Inpatient Hospital Stay: Payer: BLUE CROSS/BLUE SHIELD

## 2016-12-10 ENCOUNTER — Ambulatory Visit: Payer: BLUE CROSS/BLUE SHIELD

## 2016-12-10 VITALS — BP 98/60 | HR 83 | Temp 96.9°F | Resp 18

## 2016-12-10 DIAGNOSIS — Z171 Estrogen receptor negative status [ER-]: Principal | ICD-10-CM

## 2016-12-10 DIAGNOSIS — C50511 Malignant neoplasm of lower-outer quadrant of right female breast: Secondary | ICD-10-CM

## 2016-12-10 MED ORDER — TBO-FILGRASTIM 480 MCG/0.8ML ~~LOC~~ SOSY
480.0000 ug | PREFILLED_SYRINGE | Freq: Once | SUBCUTANEOUS | Status: AC
  Administered 2016-12-10: 480 ug via SUBCUTANEOUS
  Filled 2016-12-10: qty 0.8

## 2016-12-11 ENCOUNTER — Ambulatory Visit: Admission: RE | Admit: 2016-12-11 | Payer: BLUE CROSS/BLUE SHIELD | Source: Ambulatory Visit

## 2016-12-11 ENCOUNTER — Encounter: Payer: Self-pay | Admitting: General Surgery

## 2016-12-11 ENCOUNTER — Ambulatory Visit
Admission: RE | Admit: 2016-12-11 | Discharge: 2016-12-11 | Disposition: A | Discharge status: Home / Self Care | Payer: BLUE CROSS/BLUE SHIELD | Source: Ambulatory Visit | Attending: Internal Medicine | Admitting: Internal Medicine

## 2016-12-11 DIAGNOSIS — R234 Changes in skin texture: Secondary | ICD-10-CM | POA: Diagnosis not present

## 2016-12-11 DIAGNOSIS — Z171 Estrogen receptor negative status [ER-]: Secondary | ICD-10-CM | POA: Diagnosis not present

## 2016-12-11 DIAGNOSIS — C50511 Malignant neoplasm of lower-outer quadrant of right female breast: Secondary | ICD-10-CM | POA: Insufficient documentation

## 2016-12-11 MED ORDER — IOPAMIDOL (ISOVUE-300) INJECTION 61%
100.0000 mL | Freq: Once | INTRAVENOUS | Status: AC | PRN
  Administered 2016-12-11: 100 mL via INTRAVENOUS

## 2016-12-12 ENCOUNTER — Ambulatory Visit (INDEPENDENT_AMBULATORY_CARE_PROVIDER_SITE_OTHER): Payer: BLUE CROSS/BLUE SHIELD | Admitting: General Surgery

## 2016-12-12 ENCOUNTER — Inpatient Hospital Stay: Payer: BLUE CROSS/BLUE SHIELD

## 2016-12-12 ENCOUNTER — Inpatient Hospital Stay (HOSPITAL_BASED_OUTPATIENT_CLINIC_OR_DEPARTMENT_OTHER): Payer: BLUE CROSS/BLUE SHIELD | Admitting: Internal Medicine

## 2016-12-12 ENCOUNTER — Other Ambulatory Visit: Payer: BLUE CROSS/BLUE SHIELD

## 2016-12-12 ENCOUNTER — Encounter: Payer: Self-pay | Admitting: General Surgery

## 2016-12-12 VITALS — BP 98/62 | HR 88 | Resp 12 | Ht 64.0 in | Wt 120.0 lb

## 2016-12-12 VITALS — BP 115/80 | HR 86 | Temp 97.8°F | Resp 20 | Ht 64.0 in | Wt 121.6 lb

## 2016-12-12 DIAGNOSIS — C50511 Malignant neoplasm of lower-outer quadrant of right female breast: Secondary | ICD-10-CM

## 2016-12-12 DIAGNOSIS — Z79899 Other long term (current) drug therapy: Secondary | ICD-10-CM

## 2016-12-12 DIAGNOSIS — C50011 Malignant neoplasm of nipple and areola, right female breast: Secondary | ICD-10-CM

## 2016-12-12 DIAGNOSIS — R590 Localized enlarged lymph nodes: Secondary | ICD-10-CM | POA: Diagnosis not present

## 2016-12-12 DIAGNOSIS — Z85820 Personal history of malignant melanoma of skin: Secondary | ICD-10-CM | POA: Diagnosis not present

## 2016-12-12 DIAGNOSIS — Z923 Personal history of irradiation: Secondary | ICD-10-CM

## 2016-12-12 DIAGNOSIS — Z171 Estrogen receptor negative status [ER-]: Secondary | ICD-10-CM

## 2016-12-12 LAB — CBC WITH DIFFERENTIAL/PLATELET
BASOS PCT: 1 %
Basophils Absolute: 0 10*3/uL (ref 0–0.1)
EOS ABS: 0.1 10*3/uL (ref 0–0.7)
Eosinophils Relative: 1 %
HEMATOCRIT: 32.8 % — AB (ref 35.0–47.0)
HEMOGLOBIN: 11.3 g/dL — AB (ref 12.0–16.0)
LYMPHS ABS: 1.2 10*3/uL (ref 1.0–3.6)
Lymphocytes Relative: 15 %
MCH: 33.9 pg (ref 26.0–34.0)
MCHC: 34.5 g/dL (ref 32.0–36.0)
MCV: 98.4 fL (ref 80.0–100.0)
MONO ABS: 0.6 10*3/uL (ref 0.2–0.9)
MONOS PCT: 8 %
NEUTROS PCT: 75 %
Neutro Abs: 5.9 10*3/uL (ref 1.4–6.5)
Platelets: 183 10*3/uL (ref 150–440)
RBC: 3.33 MIL/uL — ABNORMAL LOW (ref 3.80–5.20)
RDW: 17.6 % — AB (ref 11.5–14.5)
WBC: 7.9 10*3/uL (ref 3.6–11.0)

## 2016-12-12 LAB — COMPREHENSIVE METABOLIC PANEL
ALK PHOS: 93 U/L (ref 38–126)
ALT: 15 U/L (ref 14–54)
ANION GAP: 6 (ref 5–15)
AST: 22 U/L (ref 15–41)
Albumin: 4.3 g/dL (ref 3.5–5.0)
BILIRUBIN TOTAL: 0.5 mg/dL (ref 0.3–1.2)
BUN: 12 mg/dL (ref 6–20)
CALCIUM: 9.2 mg/dL (ref 8.9–10.3)
CO2: 28 mmol/L (ref 22–32)
Chloride: 103 mmol/L (ref 101–111)
Creatinine, Ser: 0.76 mg/dL (ref 0.44–1.00)
GFR calc non Af Amer: >60 mL/min (ref >60)
Glucose, Bld: 97 mg/dL (ref 65–99)
POTASSIUM: 3.8 mmol/L (ref 3.5–5.1)
Sodium: 137 mmol/L (ref 135–145)
TOTAL PROTEIN: 7.1 g/dL (ref 6.5–8.1)

## 2016-12-12 MED ORDER — HEPARIN SOD (PORK) LOCK FLUSH 100 UNIT/ML IV SOLN
500.0000 [IU] | Freq: Once | INTRAVENOUS | Status: AC
  Administered 2016-12-12: 500 [IU] via INTRAVENOUS

## 2016-12-12 MED ORDER — SODIUM CHLORIDE 0.9% FLUSH
10.0000 mL | Freq: Once | INTRAVENOUS | Status: AC
  Administered 2016-12-12: 10 mL via INTRAVENOUS
  Filled 2016-12-12: qty 10

## 2016-12-12 NOTE — Progress Notes (Signed)
Patient ID: Kiara Grant, female   DOB: 05-16-1970, 46 y.o.   MRN: [id number]  Chief Complaint  Patient presents with  . Follow-up    HPI Kiara Grant is a 46 y.o. female.  Here for follow up and discuss right mastectomy. Currently receiving chemotherapy. She did see Dr Kiara Grant on 12-10-16 to discuss plastics and reconstructive involvement .  She is here with her husband, Kiara Grant.   HPI  Past Medical History:  Diagnosis Date  . BRCA negative 03/2014   Testing completed at [facility]  . Breast cancer ([place]) 03/2014   Right, T1b, N1 prYpT1b,N1a, ER neg, PR < 10 %, Her 2 neu not overexpressed.   . Cancer [facility]) 03-24-14   Right breast, microcalcifications. ER negative, PR less than 10%, HER-2/neu not amplified.  . Melanoma [facility]) Sept 2011   Left neck T1a, 0.35 mm. Treated by [facility]s [facility].  Marland Kitchen PONV (postoperative nausea and vomiting)    PORT PLACEMENT ONLY    Past Surgical History:  Procedure Laterality Date  . BREAST BIOPSY Right 03-24-14   +  . BREAST EXCISIONAL BIOPSY Right 10/28/2014   Lumpectomy +  . BREAST LUMPECTOMY WITH SENTINEL LYMPH NODE BIOPSY Right 10/28/2014   Procedure: right breast wide excision with sentinel node biopsy, mastoplasty, axillary dissection ;  Surgeon: Kiara Bellow, MD;  Location: [facility] ORS;  Service: General;  Laterality: Right;  . INCISIONAL BREAST BIOPSY Right 09/03/2016   Punch biopsy, invasive mammary carcinoma, Triple negative.   Marland Kitchen MELANOMA EXCISION  Nov 2011   neck  . PORT-A-CATH REMOVAL  2017  . [facility] PLACEMENT  Dec 2015  . PORTACATH PLACEMENT N/A 09/24/2016   Procedure: INSERTION PORT-A-CATH;  Surgeon: Kiara Bellow, MD;  Location: [facility] ORS;  Service: General;  Laterality: N/A;    Family History  Problem Relation Age of Onset  . Cancer Mother        lung ? mid 52    Social History Social History  Substance Use Topics  . Smoking status: Never Smoker  . Smokeless tobacco: Never Used  . Alcohol  use No    Allergies  Allergen Reactions  . Sulfa Antibiotics Nausea Only    Current Outpatient Prescriptions  Medication Sig Dispense Refill  . loratadine (CLARITIN) 10 MG tablet Take 10 mg by mouth daily as needed (after onpro injections).    . Misc Natural Products (DANDELION ROOT PO) Take by mouth.    . ondansetron (ZOFRAN) 8 MG tablet Take 8 mg by mouth every 8 (eight) hours as needed for nausea or vomiting.    . TURMERIC PO Take 1,000 mg by mouth.     No current facility-administered medications for this visit.    Facility-Administered Medications Ordered in Other Visits  Medication Dose Route Frequency Provider Last Rate Last Dose  . sodium chloride 0.9 % injection 10 mL  10 mL Intravenous PRN Evlyn Kanner, NP   10 mL at 11/08/14 1207    Review of Systems Review of Systems  Constitutional: Negative.   Respiratory: Negative.   Cardiovascular: Negative.     Blood pressure 98/62, pulse 88, resp. rate 12, height 5' 4"  (1.626 m), weight 120 lb (54.4 kg).  Physical Exam Physical Exam  Constitutional: She is oriented to person, place, and time. She appears well-developed and well-nourished.  HENT:  Mouth/Throat: Oropharynx is clear and moist.  Eyes: Conjunctivae are normal. No scleral icterus.  Neck: Neck supple.  Cardiovascular: Normal rate, regular rhythm and normal  heart sounds.   Pulmonary/Chest: Effort normal and breath sounds normal.    9.5 x 19 area involved.  Lymphadenopathy:    She has no cervical adenopathy.    She has no axillary adenopathy.       Right: No supraclavicular adenopathy present.       Left: Supraclavicular (1 cm palpable node corresponding to previous MRI and CT.) adenopathy present.  Neurological: She is alert and oriented to person, place, and time.  Skin: Skin is warm and dry.  Psychiatric: Her behavior is normal.    Data Reviewed 12/09/2016 CT of the chest abdomen and pelvis: IMPRESSION: Chest Impression:  1. Small axillary  lymph nodes hypermetabolic on comparison PET-CT scan are not significantly changed. One lymph node in LEFT axilla does measure slightly larger. 2. No mediastinal lymphadenopathy.  No suspicious pulmonary nodules. 3. Mild skin thickening in the RIGHT breast similar. 4. Consolidation in the RIGHT lung apex consistent with radiation change.  Abdomen / Pelvis Impression:  1. No evidence of metastatic disease in the abdomen pelvis. 2. No evidence skeletal metastatic disease.  Personal discussion with Dr. Rogue Bussing regarding response to most recent chemotherapy cycle.  CBC of 12/09/2016 shows improvement in her hemoglobin to 11.3 with a normal differential, white blood cell count 7900, platelet count 183,000. Improved from last week., Comprehensive metabolic panel is normal.  Phone conversation with Henrietta Hoover, D.O. from plastic surgery earlier this week.  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  Modest improvement in mediastinal disease. Stable left axillary disease.    Plan    Indications for prophylactic mastectomy to help control the cutaneous disease which seems to be progressing was reviewed. Indications for pre-op biopsies to help better outline the disease field with planned latissimus dorsi flap coverage reviewed. Possibility/likelihood of a simultaneous right axillary dissection and potential for aggravation of upper extremity swelling reviewed.      The patient will return to wall morning for 4 quadrant punch biopsy.  We'll coordinate surgical intervention with plastic surgery for the week of 12/23/2016.  HPI, Physical Exam, Assessment and Plan have been scribed under the direction and in the presence of Kiara Bellow, MD. Karie Fetch, RN  I have completed the exam and reviewed the above documentation for accuracy and completeness.  I agree with the above.  Haematologist has been used and any errors in dictation or transcription  are unintentional.  Hervey Ard, M.D., F.A.C.S.  Kiara Grant 12/13/2016, 7:27 AM

## 2016-12-12 NOTE — Assessment & Plan Note (Addendum)
#   STAGE IV RECURRENT breast cancer/ TRIPLE NEGATIVE- inflammatory changes right breast/ PET scan shows- bilateral axillary adenopathy; left supraclavicular adenopathy. Patient currently status post cycle #3 day 15 chemotherapy- 2 weeks ago. Patient able to tolerate chemotherapy better with Granix- however no significant improvement noted on clinical exam [C discussion below]. CT chest abdomen pelvis scan August 22- stable right/left adenopathy; improved mediastinal lymph nodes. No site of new disease.  # Given the slight worsening [lack of improvement]- with spread of inflammatory changes to the lateral/medial edges- I would consider this to be a suboptimal response to chemotherapy. Given the absence of any progressive visceral disease however recommend palliative mastectomy at this time. I discussed with Dr. Bary Castilla; he agrees.  # HOLD chemotherapy today. Discussed with Dr. Charissa Bash. Proceed with palliative mastectomy; needing skin flap/plastic surgery involvement.   # Patient will follow-up with me after surgery to resume chemotherapy. Anticipate 3-4 weeks postsurgical recovery prior to starting chemotherapy. I would recommend Eribulin as the next plan of option.   # I reviewed the blood work- with the patient in detail; also reviewed the imaging independently [as summarized above]; and with the patient in detail.    # 40 minutes face-to-face with the patient discussing the above plan of care; more than 50% of time spent on prognosis/ natural history; counseling and coordination.

## 2016-12-12 NOTE — Progress Notes (Signed)
[place] OFFICE PROGRESS NOTE  Patient Care Team: Copland, [name], MD (General Surgery) Copland, Ginette Otto as Referring Physician (Physician Assistant) [name], MD (Oncology)  Cancer Staging No matching staging information was found for the patient.   Oncology History   # DEC 2015-  Carcinoma of right breast T1 be N1 M0 tumor stage II diagnosis in December of 2015 (right lower and outer quadrant). Needle biopsy positive of the right, Axillary lymph node (December, 2015), positive for metastatic breast carcinoma. Estrogen receptor negative.  Progesterone receptor weakly positive.  HER-2/neu receptor negative. # JAN 2016- NEO-ADJ CHEMO- Cytoxan, Adriamycin,  followed by Taxol (neoadjuvant therapy) April 25, 2014., 3.finished 4 cycles of chemotherapy with Cytoxan and Adriamycin on 7 th  March, 2016 4.started Taxol on a weekly basis from March 29 5.  Patient will finish weekly Taxol on 13th of June, 2016 6.  Patient had lumpectomy and axillary node evaluation. ypT1b [70m]  ypN1 c (3 /12LN)MO [Dr.Byrnett].  (July, 2016) s/p RT.   # MAY 2018- RECURRENT/ IPSILATERAL BREAST- may 2018- PET- ipsilateral & contralateral axillary/supraclavicular; right breast thickening.   [name]    -------------------------------------------------------------------------- # [employer]- No targets**  7.  Genetic mutation.  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  female  the above history of recurrent/ metastatic ER negative/HER-2/neu negative- currently undergoing chemotherapy with weekly Taxol/carboplatin- is post cycle #3 day- 15 appx 2 weeks is here for follow-up/ Review the  results of the restaging CAT scans.  In the interim patient was evaluated by surgery- recommended palliative mastectomy; also met with with plastic surgery.    Patient denies any significant improvement of the chest wall/inflammatory breast lesion. She denies any new lumps or bumps. Denies any significant tingling and numbness. Denies any nausea vomiting. Denies any bone pain. No fever no chills. She denies any significant tingling and numbness.   REVIEW OF SYSTEMS:  A complete 10 point review of system is done which is negative except mentioned above/history of present illness.   PAST MEDICAL HISTORY :  Past Medical History:  Diagnosis Date  . BRCA negative 03/2014   [facility]  . Breast cancer ([facility] 03/2014   Right, T1b, N1 prYpT1b,N1a, ER neg, PR < 10 %, Her 2 neu not overexpressed.   . Cancer ([facility] 03-24-14   Right breast, microcalcifications. ER negative, PR less than 10%, HER-2/neu not amplified.  . [facility]anoma ([facility] Sept 2011   Left [facility], 0.35 mm. [facility]  .Marland KitchenPONV (postoperative nausea and vomiting)    PORT PLACEMENT ONLY    PAST SURGICAL HISTORY :   Past Surgical History:  Procedure Laterality Date  . BREAST BIOPSY Right 03-24-14   +  . BREAST EXCISIONAL BIOPSY Right 10/28/2014   Lumpectomy +  . BREAST LUMPECTOMY WITH SENTINEL LYMPH NODE BIOPSY Right 10/28/2014   Procedure: right breast wide excision with sentinel node biopsy, mastoplasty, axillary dissection ;  Surgeon: JRobert Bellow MD;  Location: [facility] ORS;  Service: General;  Laterality: Right;  . INCISIONAL BREAST BIOPSY Right 09/03/2016   Punch biopsy, invasive mammary carcinoma, Triple negative.   .Marland KitchenMELANOMA EXCISION  Nov 2011  neck  . PORT-A-CATH REMOVAL  2017  . [facility] PLACEMENT  Dec 2015  . PORTACATH PLACEMENT N/A 09/24/2016   Procedure: INSERTION PORT-A-CATH;  Surgeon: Robert Bellow, MD;  Location: [facility] ORS;  Service: General;  Laterality: N/A;      FAMILY HISTORY :   Family History  Problem Relation Age of Onset  . Cancer Mother        lung ? mid 65    SOCIAL HISTORY:   Social History  Substance Use Topics  . Smoking status: Never Smoker  . Smokeless tobacco: Never Used  . Alcohol use No    ALLERGIES:  is allergic to sulfa antibiotics.  MEDICATIONS:  Current Outpatient Prescriptions  Medication Sig Dispense Refill  . loratadine (CLARITIN) 10 MG tablet Take 10 mg by mouth daily as needed (after onpro injections).    . Misc Natural Products (DANDELION ROOT PO) Take by mouth.    . ondansetron (ZOFRAN) 8 MG tablet Take 8 mg by mouth every 8 (eight) hours as needed for nausea or vomiting.    . TURMERIC PO Take 1,000 mg by mouth.     No current facility-administered medications for this visit.    Facility-Administered Medications Ordered in Other Visits  Medication Dose Route Frequency Provider Last Rate Last Dose  . sodium chloride 0.9 % injection 10 mL  10 mL Intravenous PRN Evlyn Kanner, NP   10 mL at 11/08/14 1207    PHYSICAL EXAMINATION: ECOG PERFORMANCE STATUS: 0 - Asymptomatic  BP 115/80   Pulse 86   Temp 97.8 F (36.6 C) (Tympanic)   Resp 20   Ht 5' 4" (1.626 m)   Wt 121 lb 9.6 oz (55.2 kg)   BMI 20.87 kg/m   Filed Weights   12/12/16 0852  Weight: 121 lb 9.6 oz (55.2 kg)    GENERAL: Well-nourished well-developed; Alert, no distress and comfortable.   With her husband.  EYES: no pallor or icterus OROPHARYNX: no thrush or ulceration; good dentition  NECK: supple, no masses felt LYMPH:  no palpable lymphadenopathy in the cervical Or inguinal region. Positive for a centimeter sized lymph node in the left axilla. None on the right. LUNGS: clear to auscultation and  No wheeze or crackles HEART/CVS: regular rate & rhythm and no murmurs; No lower extremity edema ABDOMEN:abdomen soft, non-tender and normal bowel sounds Musculoskeletal:no cyanosis of digits and no clubbing  PSYCH: alert & oriented x  3 with fluent speech NEURO: no focal motor/sensory deficits SKIN:  Patient has raised papules over the right breast; no definite mass noted. Edematous; mild tenderness noted  # image- 09/13/2016.    June 28th 2018.     July 26th 2018   AUG 23rd, 2018         LABORATORY DATA:  I have reviewed the data as listed    Component Value Date/Time   NA 137 12/12/2016 0838   NA 138 08/16/2014 1332   K 3.8 12/12/2016 0838   K 3.7 08/16/2014 1332   CL 103 12/12/2016 0838   CL 106 08/16/2014 1332   CO2 28 12/12/2016 0838   CO2 27 08/16/2014 1332   GLUCOSE 97 12/12/2016 0838   GLUCOSE 118 (H) 08/16/2014 1332   BUN 12 12/12/2016 0838   BUN 9 08/16/2014 1332   CREATININE 0.76 12/12/2016 0838   CREATININE 0.71 08/16/2014 1332   CALCIUM 9.2 12/12/2016 0838   CALCIUM 9.0 08/16/2014 1332   PROT 7.1 12/12/2016 0838   PROT 7.2 08/16/2014 1332  ALBUMIN 4.3 12/12/2016 0838   ALBUMIN 4.4 08/16/2014 1332   AST 22 12/12/2016 0838   AST 28 08/16/2014 1332   ALT 15 12/12/2016 0838   ALT 31 08/16/2014 1332   ALKPHOS 93 12/12/2016 0838   ALKPHOS 45 08/16/2014 1332   BILITOT 0.5 12/12/2016 0838   BILITOT 0.4 08/16/2014 1332   GFRNONAA >60 12/12/2016 0838   GFRNONAA >60 08/16/2014 1332   GFRAA >60 12/12/2016 0838   GFRAA >60 08/16/2014 1332    No results found for: SPEP, UPEP  Lab Results  Component Value Date   WBC 7.9 12/12/2016   NEUTROABS 5.9 12/12/2016   HGB 11.3 (L) 12/12/2016   HCT 32.8 (L) 12/12/2016   MCV 98.4 12/12/2016   PLT 183 12/12/2016      Chemistry      Component Value Date/Time   NA 137 12/12/2016 0838   NA 138 08/16/2014 1332   K 3.8 12/12/2016 0838   K 3.7 08/16/2014 1332   CL 103 12/12/2016 0838   CL 106 08/16/2014 1332   CO2 28 12/12/2016 0838   CO2 27 08/16/2014 1332   BUN 12 12/12/2016 0838   BUN 9 08/16/2014 1332   CREATININE 0.76 12/12/2016 0838   CREATININE 0.71 08/16/2014 1332      Component Value Date/Time   CALCIUM 9.2 12/12/2016  0838   CALCIUM 9.0 08/16/2014 1332   ALKPHOS 93 12/12/2016 0838   ALKPHOS 45 08/16/2014 1332   AST 22 12/12/2016 0838   AST 28 08/16/2014 1332   ALT 15 12/12/2016 0838   ALT 31 08/16/2014 1332   BILITOT 0.5 12/12/2016 0838   BILITOT 0.4 08/16/2014 1332     IMPRESSION: Chest Impression:  1. Small axillary lymph nodes hypermetabolic on comparison PET-CT scan are not significantly changed. One lymph node in LEFT axilla does measure slightly larger. 2. No mediastinal lymphadenopathy.  No suspicious pulmonary nodules. 3. Mild skin thickening in the RIGHT breast similar. 4. Consolidation in the RIGHT lung apex consistent with radiation change.  Abdomen / Pelvis Impression:  1. No evidence of metastatic disease in the abdomen pelvis. 2. No evidence skeletal metastatic disease.   Electronically Signed   By: Suzy Bouchard M.D.   On: 12/11/2016 14:30  RADIOGRAPHIC STUDIES: I have personally reviewed the radiological images as listed and agreed with the findings in the report. Ct Chest W Contrast  Result Date: 12/11/2016 CLINICAL DATA:  RIGHT breast cancer. Radiation therapy and chemotherapy complete. Your recurrence in May 2018 down chemotherapy. Subsequent treatment evaluation. EXAM: CT CHEST, ABDOMEN, AND PELVIS WITH CONTRAST TECHNIQUE: Multidetector CT imaging of the chest, abdomen and pelvis was performed following the standard protocol during bolus administration of intravenous contrast. CONTRAST:  17m ISOVUE-300 IOPAMIDOL (ISOVUE-300) INJECTION 61% COMPARISON:  PET-CT scan 09/12/2016 FINDINGS: CT CHEST FINDINGS Cardiovascular: Port in the LEFT chest wall with tip in extent the distal SVC. No central pulmonary embolism. Mediastinum/Nodes: Small axillary lymph nodes which were hypermetabolic on comparison PET-CT scanner are similar. 7 mm lymph node compares to 10 mm (image 14, series 2) in the LEFT axilla. Adjacent 10 mm lymph node compares to 7 mm. In the RIGHT axilla small  lymph nodes are unchanged. No supraclavicular adenopathy. No internal mammary adenopathy. No mediastinal adenopathy. Small mediastinal lymph nodes hypermetabolic on comparison PET-CT scan are not identified. Lungs/Pleura: There is consolidation in the RIGHT upper lobe not changed from prior consistent radiation change. No new pulmonary nodules. Musculoskeletal: No aggressive osseous lesion. Mild skin thickening in the RIGHT  breast similar prior. CT ABDOMEN AND PELVIS FINDINGS Hepatobiliary: No focal hepatic lesion. No biliary ductal dilatation. Gallbladder is normal. Common bile duct is normal. Pancreas: Pancreas is normal. No ductal dilatation. No pancreatic inflammation. Spleen: Normal spleen Adrenals/urinary tract: Adrenal glands and kidneys are normal. The ureters and bladder normal. Stomach/Bowel: Stomach, small bowel, appendix, and cecum are normal. The colon and rectosigmoid colon are normal. Vascular/Lymphatic: Abdominal aorta is normal caliber. There is no retroperitoneal or periportal lymphadenopathy. No pelvic lymphadenopathy. Reproductive: Uterus and ovaries normal. Other: No peritoneal nodularity or omental nodularity. Musculoskeletal: No aggressive osseous lesion. IMPRESSION: Chest Impression: 1. Small axillary lymph nodes hypermetabolic on comparison PET-CT scan are not significantly changed. One lymph node in LEFT axilla does measure slightly larger. 2. No mediastinal lymphadenopathy.  No suspicious pulmonary nodules. 3. Mild skin thickening in the RIGHT breast similar. 4. Consolidation in the RIGHT lung apex consistent with radiation change. Abdomen / Pelvis Impression: 1. No evidence of metastatic disease in the abdomen pelvis. 2. No evidence skeletal metastatic disease. Electronically Signed   By: Suzy Bouchard M.D.   On: 12/11/2016 14:30   Ct Abdomen Pelvis W Contrast  Result Date: 12/11/2016 CLINICAL DATA:  RIGHT breast cancer. Radiation therapy and chemotherapy complete. Your recurrence  in May 2018 down chemotherapy. Subsequent treatment evaluation. EXAM: CT CHEST, ABDOMEN, AND PELVIS WITH CONTRAST TECHNIQUE: Multidetector CT imaging of the chest, abdomen and pelvis was performed following the standard protocol during bolus administration of intravenous contrast. CONTRAST:  169m ISOVUE-300 IOPAMIDOL (ISOVUE-300) INJECTION 61% COMPARISON:  PET-CT scan 09/12/2016 FINDINGS: CT CHEST FINDINGS Cardiovascular: Port in the LEFT chest wall with tip in extent the distal SVC. No central pulmonary embolism. Mediastinum/Nodes: Small axillary lymph nodes which were hypermetabolic on comparison PET-CT scanner are similar. 7 mm lymph node compares to 10 mm (image 14, series 2) in the LEFT axilla. Adjacent 10 mm lymph node compares to 7 mm. In the RIGHT axilla small lymph nodes are unchanged. No supraclavicular adenopathy. No internal mammary adenopathy. No mediastinal adenopathy. Small mediastinal lymph nodes hypermetabolic on comparison PET-CT scan are not identified. Lungs/Pleura: There is consolidation in the RIGHT upper lobe not changed from prior consistent radiation change. No new pulmonary nodules. Musculoskeletal: No aggressive osseous lesion. Mild skin thickening in the RIGHT breast similar prior. CT ABDOMEN AND PELVIS FINDINGS Hepatobiliary: No focal hepatic lesion. No biliary ductal dilatation. Gallbladder is normal. Common bile duct is normal. Pancreas: Pancreas is normal. No ductal dilatation. No pancreatic inflammation. Spleen: Normal spleen Adrenals/urinary tract: Adrenal glands and kidneys are normal. The ureters and bladder normal. Stomach/Bowel: Stomach, small bowel, appendix, and cecum are normal. The colon and rectosigmoid colon are normal. Vascular/Lymphatic: Abdominal aorta is normal caliber. There is no retroperitoneal or periportal lymphadenopathy. No pelvic lymphadenopathy. Reproductive: Uterus and ovaries normal. Other: No peritoneal nodularity or omental nodularity. Musculoskeletal: No  aggressive osseous lesion. IMPRESSION: Chest Impression: 1. Small axillary lymph nodes hypermetabolic on comparison PET-CT scan are not significantly changed. One lymph node in LEFT axilla does measure slightly larger. 2. No mediastinal lymphadenopathy.  No suspicious pulmonary nodules. 3. Mild skin thickening in the RIGHT breast similar. 4. Consolidation in the RIGHT lung apex consistent with radiation change. Abdomen / Pelvis Impression: 1. No evidence of metastatic disease in the abdomen pelvis. 2. No evidence skeletal metastatic disease. Electronically Signed   By: SSuzy BouchardM.D.   On: 12/11/2016 14:30     ASSESSMENT & PLAN:  Carcinoma of lower-outer quadrant of right breast in female, estrogen receptor negative (  [place]) # STAGE IV RECURRENT breast cancer/ TRIPLE NEGATIVE- inflammatory changes right breast/ PET scan shows- bilateral axillary adenopathy; left supraclavicular adenopathy. Patient currently status post cycle #3 day 15 chemotherapy- 2 weeks ago. Patient able to tolerate chemotherapy better with Granix- however no significant improvement noted on clinical exam [C discussion below]. CT chest abdomen pelvis scan August 22- stable right/left adenopathy; improved mediastinal lymph nodes. No site of new disease.  # Given the slight worsening [lack of improvement]- with spread of inflammatory changes to the lateral/medial edges- I would consider this to be a suboptimal response to chemotherapy. Given the absence of any progressive visceral disease however recommend palliative mastectomy at this time. I discussed with Dr. Bary Castilla; he agrees.  # HOLD chemotherapy today. Discussed with Dr. Charissa Bash. Proceed with palliative mastectomy; needing skin flap/plastic surgery involvement.   # Patient will follow-up with me after surgery to resume chemotherapy. Anticipate 3-4 weeks postsurgical recovery prior to starting chemotherapy. I would recommend Eribulin as the next plan of option.   # I reviewed  the blood work- with the patient in detail; also reviewed the imaging independently [as summarized above]; and with the patient in detail.    # 40 minutes face-to-face with the patient discussing the above plan of care; more than 50% of time spent on prognosis/ natural history; counseling and coordination.    No orders of the defined types were placed in this encounter.  All questions were answered. The patient knows to call the clinic with any problems, questions or concerns.      Cammie Sickle, MD 12/12/2016 11:27 PM

## 2016-12-13 ENCOUNTER — Encounter: Payer: Self-pay | Admitting: General Surgery

## 2016-12-13 ENCOUNTER — Ambulatory Visit: Payer: BLUE CROSS/BLUE SHIELD | Admitting: General Surgery

## 2016-12-13 VITALS — BP 122/80 | HR 74 | Resp 12 | Ht 64.0 in | Wt 120.0 lb

## 2016-12-13 DIAGNOSIS — Z171 Estrogen receptor negative status [ER-]: Principal | ICD-10-CM

## 2016-12-13 DIAGNOSIS — C50011 Malignant neoplasm of nipple and areola, right female breast: Secondary | ICD-10-CM | POA: Diagnosis not present

## 2016-12-13 NOTE — Progress Notes (Signed)
Patient ID: Kiara Grant, female   DOB: 1970-07-14, 46 y.o.   MRN: [id number]  Chief Complaint  Patient presents with  . Procedure    HPI Kiara Grant is a 46 y.o. female here today for right breast ski biopsy's.  HPI  Past Medical History:  Diagnosis Date  . BRCA negative 03/2014   Testing completed at [facility]  . Breast cancer ([place]) 03/2014   Right, T1b, N1 prYpT1b,N1a, ER neg, PR < 10 %, Her 2 neu not overexpressed.   . Cancer [facility]) 03-24-14   Right breast, microcalcifications. ER negative, PR less than 10%, HER-2/neu not amplified.  . Melanoma [facility]) Sept 2011   Left neck T1a, 0.35 mm. Treated by Mohs injury [facility].  Marland Kitchen PONV (postoperative nausea and vomiting)    PORT PLACEMENT ONLY    Past Surgical History:  Procedure Laterality Date  . BREAST BIOPSY Right 03-24-14   +  . BREAST EXCISIONAL BIOPSY Right 10/28/2014   Lumpectomy +  . BREAST LUMPECTOMY WITH SENTINEL LYMPH NODE BIOPSY Right 10/28/2014   Procedure: right breast wide excision with sentinel node biopsy, mastoplasty, axillary dissection ;  Surgeon: Robert Bellow, MD;  Location: [facility] ORS;  Service: General;  Laterality: Right;  . INCISIONAL BREAST BIOPSY Right 09/03/2016   Punch biopsy, invasive mammary carcinoma, Triple negative.   Marland Kitchen MELANOMA EXCISION  Nov 2011   neck  . PORT-A-CATH REMOVAL  2017  . [facility] PLACEMENT  Dec 2015  . PORTACATH PLACEMENT N/A 09/24/2016   Procedure: INSERTION PORT-A-CATH;  Surgeon: Robert Bellow, MD;  Location: [facility] ORS;  Service: General;  Laterality: N/A;    Family History  Problem Relation Age of Onset  . Cancer Mother        lung ? mid 34    Social History Social History  Substance Use Topics  . Smoking status: Never Smoker  . Smokeless tobacco: Never Used  . Alcohol use No    Allergies  Allergen Reactions  . Sulfa Antibiotics Nausea Only    Current Outpatient Prescriptions  Medication Sig Dispense Refill  . loratadine (CLARITIN) 10 MG  tablet Take 10 mg by mouth daily as needed (after onpro injections).    . Misc Natural Products (DANDELION ROOT PO) Take by mouth.    . ondansetron (ZOFRAN) 8 MG tablet Take 8 mg by mouth every 8 (eight) hours as needed for nausea or vomiting.    . TURMERIC PO Take 1,000 mg by mouth.     No current facility-administered medications for this visit.    Facility-Administered Medications Ordered in Other Visits  Medication Dose Route Frequency Provider Last Rate Last Dose  . sodium chloride 0.9 % injection 10 mL  10 mL Intravenous PRN Evlyn Kanner, NP   10 mL at 11/08/14 1207    Review of Systems Review of Systems  Constitutional: Negative.   Respiratory: Negative.   Cardiovascular: Negative.     Blood pressure 122/80, pulse 74, resp. rate 12, height 5' 4"  (1.626 m), weight 120 lb (54.4 kg).  Physical Exam Physical Exam  Pulmonary/Chest:      Data Reviewed Plans for quadrant biopsies to determine extent of cutaneous malignancy reviewed and acceptable to patient.  9 mL of 0.5% Xylocaine with 0.25% Marcaine with 1-200,000 epinephrine was used ChloraPrep was applied to the skin. 2.5 mm punch biopsy was were taken at the locations noted in the diagram above. These were sent in formalin for pathologic review. Each site was closed with a  4-0 Prolene simple suture followed by Telfa and Tegaderm. The procedure was well tolerated.   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    Plan    Plans are in process for salvage mastectomy and latissimus dorsi flap coverage.    HPI, Physical Exam, Assessment and Plan have been scribed under the direction and in the presence of Hervey Ard, MD.  Gaspar Cola, CMA  I have completed the exam and reviewed the above documentation for accuracy and completeness.  I agree with the above.  Haematologist has been used and any errors in dictation or transcription are unintentional.  Hervey Ard, M.D., F.A.C.S.   Robert Bellow 12/13/2016, 9:17 AM

## 2016-12-16 ENCOUNTER — Encounter: Payer: Self-pay | Admitting: *Deleted

## 2016-12-16 ENCOUNTER — Telehealth: Payer: Self-pay | Admitting: *Deleted

## 2016-12-16 ENCOUNTER — Other Ambulatory Visit: Payer: Self-pay | Admitting: General Surgery

## 2016-12-16 DIAGNOSIS — Z171 Estrogen receptor negative status [ER-]: Principal | ICD-10-CM

## 2016-12-16 DIAGNOSIS — C50911 Malignant neoplasm of unspecified site of right female breast: Secondary | ICD-10-CM

## 2016-12-16 NOTE — Telephone Encounter (Signed)
Patient's surgery has been scheduled for 12-25-16 at [facility] with Dr. Bary Castilla and Dr. Marla Roe.   This patient is aware and instructed to let us know if she has questions.

## 2016-12-19 ENCOUNTER — Encounter: Payer: Self-pay | Admitting: Hematology and Oncology

## 2016-12-20 ENCOUNTER — Ambulatory Visit: Payer: Self-pay | Admitting: Plastic Surgery

## 2016-12-20 ENCOUNTER — Ambulatory Visit (INDEPENDENT_AMBULATORY_CARE_PROVIDER_SITE_OTHER): Payer: BLUE CROSS/BLUE SHIELD

## 2016-12-20 ENCOUNTER — Encounter
Admission: RE | Admit: 2016-12-20 | Discharge: 2016-12-20 | Disposition: A | Discharge status: Home / Self Care | Payer: BLUE CROSS/BLUE SHIELD | Source: Ambulatory Visit | Attending: General Surgery | Admitting: General Surgery

## 2016-12-20 DIAGNOSIS — Z171 Estrogen receptor negative status [ER-]: Secondary | ICD-10-CM

## 2016-12-20 DIAGNOSIS — C50911 Malignant neoplasm of unspecified site of right female breast: Secondary | ICD-10-CM

## 2016-12-20 DIAGNOSIS — C50011 Malignant neoplasm of nipple and areola, right female breast: Secondary | ICD-10-CM

## 2016-12-20 NOTE — Progress Notes (Signed)
Patient ID: Kiara Grant, female   DOB: 09-Oct-1970, 46 y.o.   MRN: [id number]  Patient came in today for a wound check. The wound is clean, with no signs of infection noted. All four sutures removed. Wound edges approximated. Follow up as scheduled for surgery on 12/25/16.

## 2016-12-20 NOTE — Patient Instructions (Signed)
Your procedure is scheduled on: 12-25-16  Report to Same Day Surgery 2nd floor medical mall [facility] Entrance-take elevator on left to 2nd floor.  Check in with surgery information desk.) To find out your arrival time please call [phone] between 1PM - 3PM on 12-24-16  Remember: Instructions that are not followed completely may result in serious medical risk, up to and including death, or upon the discretion of your surgeon and anesthesiologist your surgery may need to be rescheduled.    _x___ 1. Do not eat food after midnight the night before your procedure. You may drink clear liquids up to 2 hours before you are scheduled to arrive at the hospital for your procedure.  Do not drink clear liquids within 2 hours of your scheduled arrival to the hospital.  Clear liquids include  --Water or Apple juice without pulp  --Clear carbohydrate beverage such as ClearFast or Gatorade  --Black Coffee or Clear Tea (No milk, no creamers, do not add anything to                  the coffee or Tea Type 1 and type 2 diabetics should only drink water.  No gum chewing or hard candies.     __x__ 2. No Alcohol for 24 hours before or after surgery.   __x__3. No Smoking for 24 prior to surgery.   ____  4. Bring all medications with you on the day of surgery if instructed.    __x__ 5. Notify your doctor if there is any change in your medical condition     (cold, fever, infections).     Do not wear jewelry, make-up, hairpins, clips or nail polish.  Do not wear lotions, powders, or perfumes. You may wear deodorant.  Do not shave 48 hours prior to surgery. Men may shave face and neck.  Do not bring valuables to the hospital.    [facility] is not responsible for any belongings or valuables.               Contacts, dentures or bridgework may not be worn into surgery.  Leave your suitcase in the car. After surgery it may be brought to your room.  For patients admitted to the hospital, discharge time is  determined by your                       treatment team.   Patients discharged the day of surgery will not be allowed to drive home.  You will need someone to drive you home and stay with you the night of your procedure.    Please read over the following fact sheets that you were given:   [facility] Preparing for Surgery and or MRSA Information   ____ Take anti-hypertensive (unless it includes a diuretic), cardiac, seizure, asthma,     anti-reflux and psychiatric medicines. These include:  1. NONE  2.  3.  4.  5.  6.  ____Fleets enema or Magnesium Citrate as directed.   _x___ Use CHG Soap or sage wipes as directed on instruction sheet   ____ Use inhalers on the day of surgery and bring to hospital day of surgery  ____ Stop Metformin and Janumet 2 days prior to surgery.    ____ Take 1/2 of usual insulin dose the night before surgery and none on the morning surgery.   ____ Follow recommendations from Cardiologist, Pulmonologist or PCP regarding  stopping Aspirin, Coumadin, Plavix ,Eliquis, Effient, or Pradaxa, and  Pletal.  X____Stop Anti-inflammatories such as Advil, Aleve, Ibuprofen, Motrin, Naproxen, Naprosyn, Goodies powders or aspirin products NOW-OK to take Tylenol    _x___ Stop supplements until after surgery-PT HAS ALREADY STOPPED HER DANDELION ROOT AND TURMERIC   ____ Bring C-Pap to the hospital.

## 2016-12-20 NOTE — H&P (Signed)
Kiara Grant is an 46 y.o. female.   Chief Complaint: right breast cancer HPI: Kiara Grant is a 46 yrs old wf here with her husband for evaluation and consultation for chest wall / breast reconstruction. 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  BRCA negative.  She underwent a lumpectomy 03/2014 with chemo and radiation with 12 nodes removed on Kiara right.  She had an excisional biopsy 10/2014 of Kiara reight breast and a skin biopsy 08/2016 all of which was positive for breast cancer.  She also had resection of a melanoma on her anterior neck area but did not need any additional treatment.  She has two teenage sons.  There is a significant amount of skin involvement in Kiara lower half of Kiara right breast.  There will likely not be enough skin and soft tissue to close Kiara area.  Kiara Grant is very realistic with her expectations and is not trying to undergo reconstruction.  She does not want to delay any needed treatment with a reconstruction.  She is interested in healthy coverage.  She is left handed.  She is 5 feet 4 inches tall and weight is 120 pounds.  Preop bra= 34 B.  She is scheduled for a CT tomorrow.  MEDS Current Outpatient Prescriptions:  .  lidocaine-prilocaine (EMLA) 2.5-2.5 % cream, , Disp: ,  .  loratadine (CLARITIN) 10 mg tablet, Take by mouth.,  .  ondansetron (ZOFRAN) 8 MG tablet, Take by mouth.,  .  prochlorperazine (COMPAZINE) 10 MG tablet, , Disp: ,  .  turmeric, bulk, 100 % Powd, Take by mouth., Disp: ,   Past Medical History:  Diagnosis Date  . BRCA negative 03/2014   Testing completed at [facility]  . Breast cancer ([place]) 03/2014   Right, T1b, N1 prYpT1b,N1a, ER neg, PR < 10 %, Her 2 neu not overexpressed.   . Cancer [facility]) 03-24-14   Right breast, microcalcifications. ER negative, PR less than 10%, HER-2/neu not amplified.  . Melanoma [facility]) Sept 2011   Left neck T1a, 0.35 mm. Treated by Mohs injury [facility].  Marland Kitchen PONV (postoperative  nausea and vomiting)    PORT PLACEMENT ONLY    Past Surgical History:  Procedure Laterality Date  . BREAST BIOPSY Right 03-24-14   +  . BREAST EXCISIONAL BIOPSY Right 10/28/2014   Lumpectomy +  . BREAST LUMPECTOMY WITH SENTINEL LYMPH NODE BIOPSY Right 10/28/2014   Procedure: right breast wide excision with sentinel node biopsy, mastoplasty, axillary dissection ;  Surgeon: Robert Bellow, MD;  Location: [facility] ORS;  Service: General;  Laterality: Right;  . INCISIONAL BREAST BIOPSY Right 09/03/2016   Punch biopsy, invasive mammary carcinoma, Triple negative.   Marland Kitchen MELANOMA EXCISION  Nov 2011   neck  . PORT-A-CATH REMOVAL  2017  . [facility] PLACEMENT  Dec 2015  . PORTACATH PLACEMENT N/A 09/24/2016   Procedure: INSERTION PORT-A-CATH;  Surgeon: Robert Bellow, MD;  Location: [facility] ORS;  Service: General;  Laterality: N/A;    Family History  Problem Relation Age of Onset  . Cancer Mother        lung ? mid 69   Social History:  reports that she has never smoked. She has never used smokeless tobacco. She reports that she does not drink alcohol or use drugs.  Allergies:  Allergies  Allergen Reactions  . Sulfa Antibiotics Nausea Only     (Not in a hospital admission)  No results found for this or any previous  visit (from Kiara past 48 hour(s)). No results found.  Review of Systems  Constitutional: Negative.   HENT: Negative.   Eyes: Negative.   Respiratory: Negative.   Cardiovascular: Negative.   Gastrointestinal: Negative.   Genitourinary: Negative.   Musculoskeletal: Negative.   Skin: Negative.   Neurological: Negative.   Psychiatric/Behavioral: Negative.     There were no vitals taken for this visit. Physical Exam  Constitutional: She is oriented to person, place, and time. She appears well-developed and well-nourished.  HENT:  Head: Normocephalic and atraumatic.  Eyes: Pupils are equal, round, and reactive to light. EOM are normal.  Cardiovascular: Normal rate.    Respiratory: She is in respiratory distress.  GI: Soft. She exhibits no distension. There is no tenderness.  Neurological: She is alert and oriented to person, place, and time.  Skin: Skin is warm. No rash noted. There is erythema. No pallor.  Psychiatric: She has a normal mood and affect. Judgment and thought content normal.     Assessment/Plan Once all reconstruction options were presented, a focused discussion was had regarding Kiara Grant's suitability for each of these procedures.  We focused on autologous reconstruction of Kiara breast soft tissue for coverage not for breast reconstruction.  Kiara TRAM is not a great option but is possible.  Kiara latissimus would be Kiara best option for coverage that is healthy and won't restriction motion.  I spoke with Dr. Bary Castilla and we will plan for mastectomy reconstruction of chest at Kiara same time.    Wallace Going, DO 12/20/2016, 2:11 PM

## 2016-12-24 ENCOUNTER — Encounter: Payer: Self-pay | Admitting: *Deleted

## 2016-12-24 MED ORDER — CEFAZOLIN SODIUM-DEXTROSE 2-4 GM/100ML-% IV SOLN: 2.0000 g | INTRAVENOUS | Status: DC

## 2016-12-24 MED ORDER — CEFAZOLIN SODIUM-DEXTROSE 2-4 GM/100ML-% IV SOLN
2.0000 g | INTRAVENOUS | Status: AC
  Administered 2016-12-25: 2 g via INTRAVENOUS

## 2016-12-25 ENCOUNTER — Observation Stay
Admission: RE | Admit: 2016-12-25 | Discharge: 2016-12-27 | Disposition: A | Discharge status: Home / Self Care | Payer: BLUE CROSS/BLUE SHIELD | Source: Ambulatory Visit | Attending: General Surgery | Admitting: General Surgery

## 2016-12-25 ENCOUNTER — Ambulatory Visit: Payer: BLUE CROSS/BLUE SHIELD | Admitting: Anesthesiology

## 2016-12-25 ENCOUNTER — Encounter
Admission: RE | Disposition: A | Discharge status: Home / Self Care | Payer: Self-pay | Source: Ambulatory Visit | Attending: General Surgery

## 2016-12-25 ENCOUNTER — Encounter: Payer: Self-pay | Admitting: *Deleted

## 2016-12-25 DIAGNOSIS — C50411 Malignant neoplasm of upper-outer quadrant of right female breast: Secondary | ICD-10-CM

## 2016-12-25 DIAGNOSIS — Z79899 Other long term (current) drug therapy: Secondary | ICD-10-CM | POA: Diagnosis not present

## 2016-12-25 DIAGNOSIS — C773 Secondary and unspecified malignant neoplasm of axilla and upper limb lymph nodes: Secondary | ICD-10-CM | POA: Insufficient documentation

## 2016-12-25 DIAGNOSIS — R51 Headache: Secondary | ICD-10-CM | POA: Diagnosis not present

## 2016-12-25 DIAGNOSIS — C50911 Malignant neoplasm of unspecified site of right female breast: Principal | ICD-10-CM | POA: Insufficient documentation

## 2016-12-25 DIAGNOSIS — Z9221 Personal history of antineoplastic chemotherapy: Secondary | ICD-10-CM | POA: Diagnosis not present

## 2016-12-25 DIAGNOSIS — Z923 Personal history of irradiation: Secondary | ICD-10-CM | POA: Diagnosis not present

## 2016-12-25 DIAGNOSIS — Z882 Allergy status to sulfonamides status: Secondary | ICD-10-CM | POA: Diagnosis not present

## 2016-12-25 DIAGNOSIS — Z809 Family history of malignant neoplasm, unspecified: Secondary | ICD-10-CM | POA: Insufficient documentation

## 2016-12-25 DIAGNOSIS — D649 Anemia, unspecified: Secondary | ICD-10-CM | POA: Insufficient documentation

## 2016-12-25 DIAGNOSIS — C7989 Secondary malignant neoplasm of other specified sites: Secondary | ICD-10-CM | POA: Diagnosis not present

## 2016-12-25 DIAGNOSIS — Z171 Estrogen receptor negative status [ER-]: Secondary | ICD-10-CM | POA: Insufficient documentation

## 2016-12-25 DIAGNOSIS — Z853 Personal history of malignant neoplasm of breast: Secondary | ICD-10-CM | POA: Diagnosis not present

## 2016-12-25 DIAGNOSIS — Z85820 Personal history of malignant melanoma of skin: Secondary | ICD-10-CM | POA: Diagnosis not present

## 2016-12-25 HISTORY — DX: Anemia, unspecified: D64.9

## 2016-12-25 HISTORY — DX: Personal history of antineoplastic chemotherapy: Z92.21

## 2016-12-25 HISTORY — PX: AXILLARY LYMPH NODE BIOPSY: SHX5737

## 2016-12-25 HISTORY — PX: LATISSIMUS FLAP TO BREAST: SHX5357

## 2016-12-25 HISTORY — PX: BREAST RECONSTRUCTION: SHX9

## 2016-12-25 HISTORY — PX: SIMPLE MASTECTOMY WITH AXILLARY SENTINEL NODE BIOPSY: SHX6098

## 2016-12-25 HISTORY — PX: MASTECTOMY WITH AXILLARY LYMPH NODE DISSECTION: SHX5661

## 2016-12-25 LAB — POCT PREGNANCY, URINE: Preg Test, Ur: NEGATIVE

## 2016-12-25 SURGERY — MASTECTOMY WITH AXILLARY LYMPH NODE DISSECTION
Anesthesia: General | Site: Breast | Laterality: Right

## 2016-12-25 MED ORDER — OXYCODONE HCL 5 MG PO TABS
5.0000 mg | ORAL_TABLET | ORAL | Status: DC | PRN
  Administered 2016-12-25 – 2016-12-27 (×7): 5 mg via ORAL
  Filled 2016-12-25 (×7): qty 1

## 2016-12-25 MED ORDER — ONDANSETRON HCL 4 MG/2ML IJ SOLN
INTRAMUSCULAR | Status: AC
  Administered 2016-12-25: 4 mg via INTRAVENOUS
  Filled 2016-12-25: qty 2

## 2016-12-25 MED ORDER — FENTANYL CITRATE (PF) 250 MCG/5ML IJ SOLN
INTRAMUSCULAR | Status: AC
  Filled 2016-12-25: qty 5

## 2016-12-25 MED ORDER — LIDOCAINE-EPINEPHRINE (PF) 1 %-1:200000 IJ SOLN
INTRAMUSCULAR | Status: AC
  Filled 2016-12-25: qty 30

## 2016-12-25 MED ORDER — PROMETHAZINE HCL 25 MG/ML IJ SOLN
6.2500 mg | INTRAMUSCULAR | Status: DC | PRN
  Administered 2016-12-25: 6.25 mg via INTRAVENOUS
  Filled 2016-12-25: qty 1

## 2016-12-25 MED ORDER — LIDOCAINE-EPINEPHRINE (PF) 1 %-1:200000 IJ SOLN
INTRAMUSCULAR | Status: DC | PRN
  Administered 2016-12-25: 10 mL

## 2016-12-25 MED ORDER — MIDAZOLAM HCL 2 MG/2ML IJ SOLN
INTRAMUSCULAR | Status: DC | PRN
  Administered 2016-12-25: 2 mg via INTRAVENOUS

## 2016-12-25 MED ORDER — SUGAMMADEX SODIUM 200 MG/2ML IV SOLN
INTRAVENOUS | Status: AC
  Filled 2016-12-25: qty 2

## 2016-12-25 MED ORDER — PHENYLEPHRINE HCL 10 MG/ML IJ SOLN
INTRAMUSCULAR | Status: AC
  Filled 2016-12-25: qty 1

## 2016-12-25 MED ORDER — KETOROLAC TROMETHAMINE 30 MG/ML IJ SOLN
INTRAMUSCULAR | Status: AC
  Filled 2016-12-25: qty 1

## 2016-12-25 MED ORDER — LIDOCAINE HCL (CARDIAC) 20 MG/ML IV SOLN
INTRAVENOUS | Status: DC | PRN
  Administered 2016-12-25: 80 mg via INTRAVENOUS

## 2016-12-25 MED ORDER — DIAZEPAM 5 MG PO TABS: 5.0000 mg | ORAL_TABLET | Freq: Four times a day (QID) | ORAL | Status: DC | PRN

## 2016-12-25 MED ORDER — ACETAMINOPHEN 10 MG/ML IV SOLN
1000.0000 mg | Freq: Four times a day (QID) | INTRAVENOUS | Status: AC
  Administered 2016-12-25 – 2016-12-26 (×4): 1000 mg via INTRAVENOUS
  Filled 2016-12-25 (×4): qty 100

## 2016-12-25 MED ORDER — LIDOCAINE HCL (PF) 2 % IJ SOLN
INTRAMUSCULAR | Status: AC
  Filled 2016-12-25: qty 10

## 2016-12-25 MED ORDER — LACTATED RINGERS IV SOLN
INTRAVENOUS | Status: DC
  Administered 2016-12-25 (×2): via INTRAVENOUS

## 2016-12-25 MED ORDER — KETOROLAC TROMETHAMINE 30 MG/ML IJ SOLN
INTRAMUSCULAR | Status: DC | PRN
  Administered 2016-12-25: 30 mg via INTRAVENOUS

## 2016-12-25 MED ORDER — LORATADINE 10 MG PO TABS: 10.0000 mg | ORAL_TABLET | Freq: Every day | ORAL | Status: DC | PRN

## 2016-12-25 MED ORDER — FENTANYL CITRATE (PF) 100 MCG/2ML IJ SOLN
25.0000 ug | INTRAMUSCULAR | Status: DC | PRN
  Administered 2016-12-25 (×4): 25 ug via INTRAVENOUS

## 2016-12-25 MED ORDER — MIDAZOLAM HCL 2 MG/2ML IJ SOLN
INTRAMUSCULAR | Status: AC
  Filled 2016-12-25: qty 2

## 2016-12-25 MED ORDER — MORPHINE SULFATE (PF) 2 MG/ML IV SOLN: 2.0000 mg | INTRAVENOUS | Status: DC | PRN

## 2016-12-25 MED ORDER — CEFAZOLIN SODIUM 1 G IJ SOLR
INTRAMUSCULAR | Status: AC
  Filled 2016-12-25: qty 20

## 2016-12-25 MED ORDER — EVICEL 5 ML EX KIT
PACK | CUTANEOUS | Status: DC | PRN
  Administered 2016-12-25: 5 mL

## 2016-12-25 MED ORDER — ACETAMINOPHEN 10 MG/ML IV SOLN
INTRAVENOUS | Status: DC | PRN
  Administered 2016-12-25: 1000 mg via INTRAVENOUS

## 2016-12-25 MED ORDER — LIDOCAINE HCL 2 % EX GEL
CUTANEOUS | Status: DC | PRN
  Administered 2016-12-25: 1 via TOPICAL

## 2016-12-25 MED ORDER — PROPOFOL 10 MG/ML IV BOLUS
INTRAVENOUS | Status: DC | PRN
  Administered 2016-12-25: 120 mg via INTRAVENOUS

## 2016-12-25 MED ORDER — FENTANYL CITRATE (PF) 100 MCG/2ML IJ SOLN
INTRAMUSCULAR | Status: DC | PRN
  Administered 2016-12-25: 100 ug via INTRAVENOUS
  Administered 2016-12-25: 50 ug via INTRAVENOUS
  Administered 2016-12-25: 25 ug via INTRAVENOUS
  Administered 2016-12-25 (×2): 50 ug via INTRAVENOUS
  Administered 2016-12-25: 25 ug via INTRAVENOUS
  Administered 2016-12-25: 50 ug via INTRAVENOUS

## 2016-12-25 MED ORDER — ONDANSETRON HCL 4 MG PO TABS
8.0000 mg | ORAL_TABLET | Freq: Three times a day (TID) | ORAL | Status: DC | PRN
  Filled 2016-12-25: qty 2

## 2016-12-25 MED ORDER — PROPOFOL 10 MG/ML IV BOLUS
INTRAVENOUS | Status: AC
  Filled 2016-12-25: qty 40

## 2016-12-25 MED ORDER — CEFAZOLIN SODIUM-DEXTROSE 2-3 GM-% IV SOLR
INTRAVENOUS | Status: DC | PRN
  Administered 2016-12-25: 2 g via INTRAVENOUS

## 2016-12-25 MED ORDER — ONDANSETRON HCL 4 MG/2ML IJ SOLN
4.0000 mg | Freq: Once | INTRAMUSCULAR | Status: AC | PRN
  Administered 2016-12-25: 4 mg via INTRAVENOUS

## 2016-12-25 MED ORDER — ROCURONIUM BROMIDE 100 MG/10ML IV SOLN
INTRAVENOUS | Status: DC | PRN
  Administered 2016-12-25: 40 mg via INTRAVENOUS
  Administered 2016-12-25 (×5): 20 mg via INTRAVENOUS
  Administered 2016-12-25: 10 mg via INTRAVENOUS

## 2016-12-25 MED ORDER — DEXAMETHASONE SODIUM PHOSPHATE 10 MG/ML IJ SOLN
INTRAMUSCULAR | Status: AC
  Filled 2016-12-25: qty 1

## 2016-12-25 MED ORDER — CEFAZOLIN SODIUM-DEXTROSE 2-4 GM/100ML-% IV SOLN
INTRAVENOUS | Status: AC
  Filled 2016-12-25: qty 100

## 2016-12-25 MED ORDER — PHENYLEPHRINE HCL 10 MG/ML IJ SOLN
INTRAMUSCULAR | Status: DC | PRN
  Administered 2016-12-25 (×2): 100 ug via INTRAVENOUS

## 2016-12-25 MED ORDER — CHLORHEXIDINE GLUCONATE CLOTH 2 % EX PADS
6.0000 | MEDICATED_PAD | Freq: Once | CUTANEOUS | Status: DC
  Administered 2016-12-25: 6 via TOPICAL

## 2016-12-25 MED ORDER — FENTANYL CITRATE (PF) 100 MCG/2ML IJ SOLN
INTRAMUSCULAR | Status: AC
  Filled 2016-12-25: qty 2

## 2016-12-25 MED ORDER — LACTATED RINGERS IV SOLN
INTRAVENOUS | Status: DC
  Administered 2016-12-25 – 2016-12-26 (×2): via INTRAVENOUS

## 2016-12-25 MED ORDER — DEXAMETHASONE SODIUM PHOSPHATE 10 MG/ML IJ SOLN
INTRAMUSCULAR | Status: DC | PRN
  Administered 2016-12-25: 10 mg via INTRAVENOUS

## 2016-12-25 MED ORDER — FAMOTIDINE 20 MG PO TABS
20.0000 mg | ORAL_TABLET | Freq: Once | ORAL | Status: AC
  Administered 2016-12-25: 20 mg via ORAL

## 2016-12-25 MED ORDER — TRAMADOL HCL 50 MG PO TABS: 50.0000 mg | ORAL_TABLET | Freq: Four times a day (QID) | ORAL | Status: DC | PRN

## 2016-12-25 MED ORDER — SUGAMMADEX SODIUM 200 MG/2ML IV SOLN
INTRAVENOUS | Status: DC | PRN
  Administered 2016-12-25: 150 mg via INTRAVENOUS

## 2016-12-25 MED ORDER — EPHEDRINE SULFATE 50 MG/ML IJ SOLN
INTRAMUSCULAR | Status: AC
  Filled 2016-12-25: qty 1

## 2016-12-25 MED ORDER — GLYCOPYRROLATE 0.2 MG/ML IJ SOLN
INTRAMUSCULAR | Status: AC
  Filled 2016-12-25: qty 1

## 2016-12-25 MED ORDER — ONDANSETRON HCL 4 MG/2ML IJ SOLN
INTRAMUSCULAR | Status: AC
  Filled 2016-12-25: qty 2

## 2016-12-25 MED ORDER — ENOXAPARIN SODIUM 40 MG/0.4ML ~~LOC~~ SOLN
40.0000 mg | SUBCUTANEOUS | Status: DC
  Administered 2016-12-26: 40 mg via SUBCUTANEOUS
  Filled 2016-12-25: qty 0.4

## 2016-12-25 MED ORDER — ROCURONIUM BROMIDE 50 MG/5ML IV SOLN
INTRAVENOUS | Status: AC
  Filled 2016-12-25: qty 1

## 2016-12-25 MED ORDER — FENTANYL CITRATE (PF) 100 MCG/2ML IJ SOLN
INTRAMUSCULAR | Status: AC
  Administered 2016-12-25: 25 ug via INTRAVENOUS
  Filled 2016-12-25: qty 2

## 2016-12-25 MED ORDER — LIDOCAINE HCL 2 % EX GEL
CUTANEOUS | Status: AC
  Filled 2016-12-25: qty 5

## 2016-12-25 MED ORDER — FAMOTIDINE 20 MG PO TABS
ORAL_TABLET | ORAL | Status: AC
  Filled 2016-12-25: qty 1

## 2016-12-25 MED ORDER — CEFAZOLIN SODIUM-DEXTROSE 1-4 GM/50ML-% IV SOLN
1.0000 g | Freq: Three times a day (TID) | INTRAVENOUS | Status: DC
  Administered 2016-12-25 – 2016-12-27 (×5): 1 g via INTRAVENOUS
  Filled 2016-12-25 (×7): qty 50

## 2016-12-25 MED ORDER — ONDANSETRON HCL 4 MG/2ML IJ SOLN
INTRAMUSCULAR | Status: DC | PRN
  Administered 2016-12-25: 4 mg via INTRAVENOUS

## 2016-12-25 MED ORDER — SUCCINYLCHOLINE CHLORIDE 20 MG/ML IJ SOLN
INTRAMUSCULAR | Status: AC
  Filled 2016-12-25: qty 1

## 2016-12-25 MED ORDER — ACETAMINOPHEN 10 MG/ML IV SOLN
INTRAVENOUS | Status: AC
  Filled 2016-12-25: qty 100

## 2016-12-25 MED ORDER — CHLORHEXIDINE GLUCONATE CLOTH 2 % EX PADS
6.0000 | MEDICATED_PAD | Freq: Once | CUTANEOUS | Status: AC
  Administered 2016-12-25: 6 via TOPICAL

## 2016-12-25 SURGICAL SUPPLY — 97 items
APPLIER CLIP 11 MED OPEN (CLIP)
APPLIER CLIP 13 LRG OPEN (CLIP)
BAG DECANTER FOR FLEXI CONT (MISCELLANEOUS) ×4 IMPLANT
BANDAGE ELASTIC 6 LF NS (GAUZE/BANDAGES/DRESSINGS) IMPLANT
BINDER BREAST LRG (GAUZE/BANDAGES/DRESSINGS) ×4 IMPLANT
BINDER BREAST XLRG (GAUZE/BANDAGES/DRESSINGS) IMPLANT
BLADE BOVIE TIP EXT 4 (BLADE) ×4 IMPLANT
BLADE PHOTON ILLUMINATED (MISCELLANEOUS) ×8 IMPLANT
BLADE SURG 15 STRL LF DISP TIS (BLADE) ×3 IMPLANT
BLADE SURG 15 STRL SS (BLADE) ×1
BLADE SURG 15 STRL SS SAFETY (BLADE) ×4 IMPLANT
BNDG GAUZE 4.5X4.1 6PLY STRL (MISCELLANEOUS) ×12 IMPLANT
BULB RESERV EVAC DRAIN JP 100C (MISCELLANEOUS) ×12 IMPLANT
CANISTER SUCT 1200ML W/VALVE (MISCELLANEOUS) ×8 IMPLANT
CHLORAPREP W/TINT 26ML (MISCELLANEOUS) ×12 IMPLANT
CLIP APPLIE 11 MED OPEN (CLIP) IMPLANT
CLIP APPLIE 13 LRG OPEN (CLIP) IMPLANT
CNTNR SPEC 2.5X3XGRAD LEK (MISCELLANEOUS) ×21
CONT SPEC 4OZ STER OR WHT (MISCELLANEOUS) ×7
CONTAINER SPEC 2.5X3XGRAD LEK (MISCELLANEOUS) ×21 IMPLANT
COUNTER NEEDLE 20/40 LG (NEEDLE) ×4 IMPLANT
DECANTER SPIKE VIAL GLASS SM (MISCELLANEOUS) ×4 IMPLANT
DERMABOND ADVANCED (GAUZE/BANDAGES/DRESSINGS) ×4
DERMABOND ADVANCED .7 DNX12 (GAUZE/BANDAGES/DRESSINGS) ×12 IMPLANT
DEVICE DUBIN SPECIMEN MAMMOGRA (MISCELLANEOUS) IMPLANT
DRAIN CHANNEL 19F RND (DRAIN) ×12 IMPLANT
DRAIN CHANNEL JP 15F RND 16 (MISCELLANEOUS) IMPLANT
DRAPE INCISE 23X17 IOBAN STRL (DRAPES) ×1
DRAPE INCISE IOBAN 23X17 STRL (DRAPES) ×3 IMPLANT
DRAPE LAPAROTOMY TRNSV 106X77 (MISCELLANEOUS) ×8 IMPLANT
DRAPE SHEET LG 3/4 BI-LAMINATE (DRAPES) ×4 IMPLANT
DRAPE UNIVERSAL (DRAPES) ×4 IMPLANT
DRSG AQUACEL AG ADV 3.5X10 (GAUZE/BANDAGES/DRESSINGS) ×4 IMPLANT
DRSG TEGADERM 2-3/8X2-3/4 SM (GAUZE/BANDAGES/DRESSINGS) ×4 IMPLANT
DRSG TELFA 3X8 NADH (GAUZE/BANDAGES/DRESSINGS) ×4 IMPLANT
ELECT BLADE 6.5 EXT (BLADE) ×4 IMPLANT
ELECT CAUTERY BLADE 6.4 (BLADE) ×4 IMPLANT
ELECT CAUTERY BLADE TIP 2.5 (TIP) ×8
ELECT REM PT RETURN 9FT ADLT (ELECTROSURGICAL) ×4
ELECTRODE CAUTERY BLDE TIP 2.5 (TIP) ×6 IMPLANT
ELECTRODE REM PT RTRN 9FT ADLT (ELECTROSURGICAL) ×3 IMPLANT
GAUZE FLUFF 18X24 1PLY STRL (GAUZE/BANDAGES/DRESSINGS) ×4 IMPLANT
GAUZE SPONGE 4X4 12PLY STRL (GAUZE/BANDAGES/DRESSINGS) ×8 IMPLANT
GLOVE BIO SURGEON STRL SZ 6.5 (GLOVE) ×8 IMPLANT
GLOVE BIO SURGEON STRL SZ7.5 (GLOVE) ×4 IMPLANT
GLOVE INDICATOR 8.0 STRL GRN (GLOVE) ×4 IMPLANT
GOWN STRL REUS W/ TWL LRG LVL3 (GOWN DISPOSABLE) ×18 IMPLANT
GOWN STRL REUS W/TWL LRG LVL3 (GOWN DISPOSABLE) ×6
IV NS 1000ML (IV SOLUTION) ×1
IV NS 1000ML BAXH (IV SOLUTION) ×3 IMPLANT
IV NS 500ML (IV SOLUTION) ×1
IV NS 500ML BAXH (IV SOLUTION) ×3 IMPLANT
KIT PREVENA INCISION MGT 13 (CANNISTER) ×4 IMPLANT
KIT RM TURNOVER STRD PROC AR (KITS) ×4 IMPLANT
LABEL OR SOLS (LABEL) ×4 IMPLANT
NEEDLE HYPO 22GX1.5 SAFETY (NEEDLE) ×4 IMPLANT
NEEDLE HYPO 25X1 1.5 SAFETY (NEEDLE) IMPLANT
PACK BASIN MINOR ARMC (MISCELLANEOUS) ×4 IMPLANT
PAD ABD DERMACEA PRESS 5X9 (GAUZE/BANDAGES/DRESSINGS) ×16 IMPLANT
PAD PREP 24X41 OB/GYN DISP (PERSONAL CARE ITEMS) ×8 IMPLANT
PIN SAFETY STRL (MISCELLANEOUS) ×8 IMPLANT
SHEARS FOC LG CVD HARMONIC 17C (MISCELLANEOUS) IMPLANT
SLEVE PROBE SENORX GAMMA FIND (MISCELLANEOUS) ×4 IMPLANT
SPONGE LAP 18X18 5 PK (GAUZE/BANDAGES/DRESSINGS) ×8 IMPLANT
STRIP CLOSURE SKIN 1/2X4 (GAUZE/BANDAGES/DRESSINGS) ×12 IMPLANT
SUT ETHILON 3-0 FS-10 30 BLK (SUTURE) ×4
SUT MNCRL 3-0 UNDYED SH (SUTURE) ×21 IMPLANT
SUT MNCRL 3-0 VIOLET CT-1 (SUTURE) ×6 IMPLANT
SUT MNCRL 4-0 (SUTURE) ×5
SUT MNCRL 4-0 27XMFL (SUTURE) ×15
SUT MNCRL AB 4-0 PS2 18 (SUTURE) ×32 IMPLANT
SUT MNCRL+ 5-0 UNDYED PC-3 (SUTURE) ×9 IMPLANT
SUT MON AB 2-0 CT1 36 (SUTURE) ×24 IMPLANT
SUT MONOCRYL 3-0 (SUTURE) ×2
SUT MONOCRYL 3-0 UNDYED (SUTURE) ×7
SUT MONOCRYL 5-0 (SUTURE) ×3
SUT PDS AB 1 CT1 27 (SUTURE) IMPLANT
SUT PDS AB 2-0 CT2 27 (SUTURE) IMPLANT
SUT SILK 0 (SUTURE)
SUT SILK 0 30XBRD TIE 6 (SUTURE) IMPLANT
SUT SILK 3 0 SH 30 (SUTURE) ×4 IMPLANT
SUT SILK 4 0 SH (SUTURE) ×8 IMPLANT
SUT VIC AB 2-0 CT1 27 (SUTURE) ×2
SUT VIC AB 2-0 CT1 TAPERPNT 27 (SUTURE) ×6 IMPLANT
SUT VIC AB 2-0 CT2 27 (SUTURE) ×4 IMPLANT
SUT VIC AB 3-0 SH 27 (SUTURE) ×1
SUT VIC AB 3-0 SH 27X BRD (SUTURE) ×3 IMPLANT
SUT VICRYL+ 3-0 144IN (SUTURE) IMPLANT
SUTURE EHLN 3-0 FS-10 30 BLK (SUTURE) ×3 IMPLANT
SUTURE MNCRL 4-0 27XMF (SUTURE) ×15 IMPLANT
SWABSTK COMLB BENZOIN TINCTURE (MISCELLANEOUS) ×4 IMPLANT
SYR 10ML LL (SYRINGE) ×8 IMPLANT
SYR BULB IRRIG 60ML STRL (SYRINGE) ×4 IMPLANT
SYR CONTROL 10ML (SYRINGE) IMPLANT
TAPE TRANSPORE STRL 2 31045 (GAUZE/BANDAGES/DRESSINGS) ×4 IMPLANT
TOWEL OR 17X26 4PK STRL BLUE (TOWEL DISPOSABLE) ×16 IMPLANT
WND VAC CANISTER 500ML (MISCELLANEOUS) ×4 IMPLANT

## 2016-12-25 NOTE — Transfer of Care (Signed)
Immediate Anesthesia Transfer of Care Note  Patient: Kiara Grant  Procedure(s) Performed: Procedure(s) with comments: MASTECTOMY WITH AXILLARY LYMPH NODE DISSECTION (Right) BREAST RECONSTRUCTION (Right) LATISSIMUS FLAP TO BREAST (Right) - Right Latissimus myocutaneous flap with rotational flap from abdomen SIMPLE MASTECTOMY (Right) AXILLARY LYMPH NODE BIOPSY (Left)  Patient Location: PACU  Anesthesia Type:General  Level of Consciousness: sedated  Airway & Oxygen Therapy: Patient Spontanous Breathing and Patient connected to face mask oxygen  Post-op Assessment: Report given to RN and Post -op Vital signs reviewed and stable  Post vital signs: Reviewed and stable  Last Vitals:  Vitals:   12/25/16 0617 12/25/16 1322  BP: 117/77 112/64  Pulse: 90 (!) 107  Resp: 12 16  Temp: 36.4 C 36.9 C  SpO2: 099% 833%    Complications: No apparent anesthesia complications

## 2016-12-25 NOTE — H&P (Signed)
No change in clinical condition or exam, with the exception of tenderness in the left axilla where nodes are now palpable.  Will plan for salvage mastectomy, possible axillary dissection based on discussion with Dr. Marla Roe and then excision of the left axillary lymph node.

## 2016-12-25 NOTE — Op Note (Addendum)
DATE OF OPERATION: 12/25/2016  LOCATION: [facility] Main OR  PREOPERATIVE DIAGNOSIS: Right Breast Cancer mastectomy with chest wall defect 25 x 28 cm  POSTOPERATIVE DIAGNOSIS: Same  PROCEDURE: 1. Latissimus myocutaneous flap to reconstruct the 10 x 15 cm defect right chest /  breast  2. Rotational flap from Abdomen to right chest 15 x 13 cm  SURGEON: Ines Warf Sanger Eliani Leclere, DO  EBL: 250 cc  SPECIMEN: None  DRAINS: Three total 19 blake round drains  CONDITION: Stable  COMPLICATIONS: None  INDICATION: The patient, Kiara Grant, is a 46 y.o. female born on 1970-09-25, is here for treatment after a mastectomy.    PROCEDURE DETAILS:  The patient was seen on the morning of her surgery and marked out for her flap.  She was given an IV and IV antibiotics. She was then taken to the operating room and given a general anesthetic. A standard time out was performed and all information was confirmed by those in the room. SCD's were placed.   General surgery performed their portion of the case that included a right mastectomy.  The patient was rendered to the plastic surgery team.  An ioban was placed over the mastectomy site to keep it clean and sterile. The patient was placed into the left lateral decubitus position on a bean bag with all key points padded. She was then prepped and draped in the standard sterile fashion using a chloroprep. The paddle design and position was confirmed. The procedure began by incising the margins of the paddle and dissecting out until all four margins of the muscle were identified. The muscle was then released inferiorly and anteriorly and care was taken not to pick up the serratus anteriorly or the paraspinous muscles posteriorly. The flap was then raised to the scapula and released and rotated medially. Care was taken to protect the vascular pedicle throughout this portion of the procedure. The paddle and muscle looked healthy throughout the case.  The  posterior and anterior pockets were then connected in the plane above the muscle. The muscle from the back and the skin paddle were then rotated into the chest pocket. The flap pedicle was inspected and there was no tension. Hemostasis of the back pocket was achieved with electrocautery.  Evicel was placed in the pocket. Two #19 drains were place and secured with 4-0 Silk. The back incision was closed with buried 2-0 Moncryl, followed by 4-0 Monocryl and 5-0 Monocryl.  Dermabond and a protective dressing was applied.   The patient was then repositioned onto her back and the chest was prepped and draped. The breast pocket was inspected and hemostases was achieved with electrocautery. The chest skin and soft tissue was undermined to the clavicle superiorly and to the umbilicus inferiorly to gain closure as the defect was ~ 25 x 28 cm.  The latissimus flap was positioned at the superior margin of the chest / breast wall and sutured into place with the 3-0 and 4-0 Monocryl.  This covered 10 x 15 cm of the defect.    The abdominal soft tissue was able to close the remaining 15 x 13 cm.  The decision was made to rotate and advance the abdominal soft tissue superiorly by 8 cm to gain coverage of the 15 x 13 cm remaining defect.  This was done with an incision at the abdominal area in the vertical direction to the left of midline.  The abdominal flap was then rotated to the right and advanced into the defect.  The flap was then closed with 2-0 Monocryl deep, followed by 4-0 Monocryl and the skin closed with 5-0 Monocryl.  A #19 drain was placed secured with 4-0 Silk. A prevena VAC was placed at the inferior incision site of the flap as it was not closed air tight to prevent too much tension on the flap.  The Dermabond was applied to the closed incisions.  ABDs and a loss fitting breast binder was applied.   The patient was rendered to the general surgery team for the left axillary lymph node dissection. The family was  notified at the end of the case.

## 2016-12-25 NOTE — Op Note (Signed)
Preoperative diagnosis: Cutaneous recurrence of right breast cancer with extensive chest wall spread. Contralateral axillary adenopathy.  Postoperative diagnosis: Same.  Operative procedure: Salvage mastectomy with excision of left axillary metastatic disease. Latissimus dorsi flap reconstruction of the chest wall.  Surgeons: Hervey Ard, M.D.; Audelia Hives, D.O.  Anesthesia: Gen. endotracheal.  Estimated blood loss: Less than 100 mL.  Clinical note: This 46 year old woman is 2 years after undergoing breast conservation surgery, sentinel node biopsy Dissection followed by whole breast radiation for a triple negative carcinoma in the 9:00 position of the right breast. She has developed cutaneous recurrence primarily in the inferior aspect of the breast and previous PET scan showed evidence of contralateral axillary metastatic disease as well as intrathoracic disease. Follow-up chest CT after her most recent cycles of chemotherapy of shown resolution of the intrathoracic disease but persistence of the left axillary disease. Clinical examination shows progression over the past 4 weeks of the cutaneous disease in the lower half of the right breast. She is felt be a candidate for salvage mastectomy as she is failed adjuvant chemotherapy and has previously received chest wall radiation. The patient was evaluated by Audelia Hives, D.O., from the plastic surgery service and plans are for a combined salvage mastectomy and latissimus flap reconstruction. The morning of surgery the patient reported that the axilla on the left sided become symptomatic and she was now able to palpate nodularity in this area and it was elected to proceed with excision of this foci of disease at the same setting. Dr. Eusebio Friendly portion will be dictated separately.  Operative note: The patient had SCD stockings for DVT prevention received Kefzol prior to the procedure. The right chest neck and axilla as well as the  upper abdomen were prepped with ChloraPrep and draped. Previous four-quadrant biopsies had showed no evidence of cutaneous disease at the 3, 6 and 9:00 positions. There was evidence of intralymphatic disease at the 12:00 position about 1 cm above the areola. A wide elliptical incision was made to encompass all known disease and frozen section of the 12:00 position was positive for intradermal lymphatic spread. An additional 1.5 cm ellipse of skin was excised and this also was positive for recurrent disease. A final 1.5 cm ellipse of skin was excised and the 12:00 position again was positive for intralymphatic spread. At this point the defect was a 19 x 24 cm wound and it was not felt possible to take additional tissue from the upper flap and obtain wound closure which was felt to be of primary importance to reinstitute adjuvant chemotherapy. The patient had undergone salvage mastectomy with elevation of flaps to include all the residual breast parenchyma and extending across the midline to the medial aspect of the left breast. Dissection was carried below the inframammary fold and to the serratus muscle laterally. The breast was elevated off the underlying pectoralis muscle taking the residual fascia with the specimen. The breast itself was sent fresh per protocol. After all known disease including biopsy of a 1 cm area of fibrotic tissue in the right axilla, frozen section report showing evidence of adenocarcinoma attention was turned by Dr. Marla Roe to coverage of the chest wall defect.  After the latissimus flap reconstruction attention was turned to the left axilla where palpable disease was evident and had become clinically symptomatic to the patient. A transverse incision was made and 4-1 cm nodal areas were excised with 2 located above the level of the axillary envelope and 2 below. Hemostasis was obtained with electrocautery.  The axillary envelope was closed with interrupted 2-0 Vicryl figure-of-eight  sutures and the adipose layer closed in a similar fashion. The skin was closed with a running 4-0 Vicryl subcuticular suture. Benzoin, Steri-Strips, Telfa and Tegaderm dressing applied.  Tegaderm dressing was applied over the 3 right chest wall drain sites and the wound VAC was placed to self suction. A bulky dressing was applied and the patient taken to the recovery room in stable condition.

## 2016-12-25 NOTE — Anesthesia Preprocedure Evaluation (Signed)
Anesthesia Evaluation  Patient identified by MRN, date of birth, ID band Patient awake    Reviewed: Allergy & Precautions, NPO status , Patient's Chart, lab work & pertinent test results, reviewed documented beta blocker date and time   History of Anesthesia Complications (+) PONV  Airway Mallampati: II  TM Distance: >3 FB     Dental  (+) Chipped   Pulmonary           Cardiovascular      Neuro/Psych  Headaches,    GI/Hepatic   Endo/Other    Renal/GU      Musculoskeletal   Abdominal   Peds  Hematology  (+) anemia ,   Anesthesia Other Findings   Reproductive/Obstetrics                             Anesthesia Physical Anesthesia Plan  ASA: II  Anesthesia Plan: General   Post-op Pain Management:    Induction: Intravenous  PONV Risk Score and Plan:   Airway Management Planned: LMA  Additional Equipment:   Intra-op Plan:   Post-operative Plan:   Informed Consent: I have reviewed the patients History and Physical, chart, labs and discussed the procedure including the risks, benefits and alternatives for the proposed anesthesia with the patient or authorized representative who has indicated his/her understanding and acceptance.     Plan Discussed with: CRNA  Anesthesia Plan Comments:         Anesthesia Quick Evaluation

## 2016-12-25 NOTE — H&P (View-Only) (Signed)
Kiara Grant is an 46 y.o. female.   Chief Complaint: right breast cancer HPI: The patient is a 46 yrs old wf here with her husband for evaluation and consultation for chest wall / breast reconstruction. 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  BRCA negative.  She underwent a lumpectomy 03/2014 with chemo and radiation with 12 nodes removed on the right.  She had an excisional biopsy 10/2014 of the reight breast and a skin biopsy 08/2016 all of which was positive for breast cancer.  She also had resection of a melanoma on her anterior neck area but did not need any additional treatment.  She has two teenage sons.  There is a significant amount of skin involvement in the lower half of the right breast.  There will likely not be enough skin and soft tissue to close the area.  The patient is very realistic with her expectations and is not trying to undergo reconstruction.  She does not want to delay any needed treatment with a reconstruction.  She is interested in healthy coverage.  She is left handed.  She is 5 feet 4 inches tall and weight is 120 pounds.  Preop bra= 34 B.  She is scheduled for a CT tomorrow.  MEDS Current Outpatient Prescriptions:  .  lidocaine-prilocaine (EMLA) 2.5-2.5 % cream, , Disp: ,  .  loratadine (CLARITIN) 10 mg tablet, Take by mouth.,  .  ondansetron (ZOFRAN) 8 MG tablet, Take by mouth.,  .  prochlorperazine (COMPAZINE) 10 MG tablet, , Disp: ,  .  turmeric, bulk, 100 % Powd, Take by mouth., Disp: ,   Past Medical History:  Diagnosis Date  . BRCA negative 03/2014   Testing completed at [facility]  . Breast cancer ([place]) 03/2014   Right, T1b, N1 prYpT1b,N1a, ER neg, PR < 10 %, Her 2 neu not overexpressed.   . Cancer [facility]) 03-24-14   Right breast, microcalcifications. ER negative, PR less than 10%, HER-2/neu not amplified.  . Melanoma [facility]) Sept 2011   Left neck T1a, 0.35 mm. Treated by Mohs injury [facility].  Marland Kitchen PONV (postoperative  nausea and vomiting)    PORT PLACEMENT ONLY    Past Surgical History:  Procedure Laterality Date  . BREAST BIOPSY Right 03-24-14   +  . BREAST EXCISIONAL BIOPSY Right 10/28/2014   Lumpectomy +  . BREAST LUMPECTOMY WITH SENTINEL LYMPH NODE BIOPSY Right 10/28/2014   Procedure: right breast wide excision with sentinel node biopsy, mastoplasty, axillary dissection ;  Surgeon: Robert Bellow, MD;  Location: [facility] ORS;  Service: General;  Laterality: Right;  . INCISIONAL BREAST BIOPSY Right 09/03/2016   Punch biopsy, invasive mammary carcinoma, Triple negative.   Marland Kitchen MELANOMA EXCISION  Nov 2011   neck  . PORT-A-CATH REMOVAL  2017  . [facility] PLACEMENT  Dec 2015  . PORTACATH PLACEMENT N/A 09/24/2016   Procedure: INSERTION PORT-A-CATH;  Surgeon: Robert Bellow, MD;  Location: [facility] ORS;  Service: General;  Laterality: N/A;    Family History  Problem Relation Age of Onset  . Cancer Mother        lung ? mid 53   Social History:  reports that she has never smoked. She has never used smokeless tobacco. She reports that she does not drink alcohol or use drugs.  Allergies:  Allergies  Allergen Reactions  . Sulfa Antibiotics Nausea Only     (Not in a hospital admission)  No results found for this or any previous  visit (from the past 48 hour(s)). No results found.  Review of Systems  Constitutional: Negative.   HENT: Negative.   Eyes: Negative.   Respiratory: Negative.   Cardiovascular: Negative.   Gastrointestinal: Negative.   Genitourinary: Negative.   Musculoskeletal: Negative.   Skin: Negative.   Neurological: Negative.   Psychiatric/Behavioral: Negative.     There were no vitals taken for this visit. Physical Exam  Constitutional: She is oriented to person, place, and time. She appears well-developed and well-nourished.  HENT:  Head: Normocephalic and atraumatic.  Eyes: Pupils are equal, round, and reactive to light. EOM are normal.  Cardiovascular: Normal rate.    Respiratory: She is in respiratory distress.  GI: Soft. She exhibits no distension. There is no tenderness.  Neurological: She is alert and oriented to person, place, and time.  Skin: Skin is warm. No rash noted. There is erythema. No pallor.  Psychiatric: She has a normal mood and affect. Judgment and thought content normal.     Assessment/Plan Once all reconstruction options were presented, a focused discussion was had regarding the patient's suitability for each of these procedures.  We focused on autologous reconstruction of the breast soft tissue for coverage not for breast reconstruction.  The TRAM is not a great option but is possible.  The latissimus would be the best option for coverage that is healthy and won't restriction motion.  I spoke with Dr. Bary Castilla and we will plan for mastectomy reconstruction of chest at the same time.    Wallace Going, DO 12/20/2016, 2:11 PM

## 2016-12-25 NOTE — Anesthesia Postprocedure Evaluation (Signed)
Anesthesia Post Note  Patient: Kiara Grant  Procedure(s) Performed: Procedure(s) (LRB): MASTECTOMY WITH AXILLARY LYMPH NODE DISSECTION (Right) BREAST RECONSTRUCTION (Right) LATISSIMUS FLAP TO BREAST (Right) SIMPLE MASTECTOMY (Right) AXILLARY LYMPH NODE BIOPSY (Left)  Patient location during evaluation: PACU Anesthesia Type: General Level of consciousness: awake and alert Pain management: pain level controlled Vital Signs Assessment: post-procedure vital signs reviewed and stable Respiratory status: spontaneous breathing, nonlabored ventilation, respiratory function stable and patient connected to nasal cannula oxygen Cardiovascular status: blood pressure returned to baseline and stable Postop Assessment: no signs of nausea or vomiting Anesthetic complications: no     Last Vitals:  Vitals:   12/25/16 1322 12/25/16 1329  BP: 112/64   Pulse: (!) 107 96  Resp: 16 15  Temp: 36.9 C   SpO2: 100% 100%    Last Pain:  Vitals:   12/25/16 1329  TempSrc:   PainSc: 7                  Keaten Mashek S

## 2016-12-25 NOTE — Anesthesia Procedure Notes (Signed)
Procedure Name: Intubation Date/Time: 12/25/2016 7:29 AM Performed by: Doreen Salvage Pre-anesthesia Checklist: Patient identified, Patient being monitored, Timeout performed, Emergency Drugs available and Suction available Patient Re-evaluated:Patient Re-evaluated prior to induction Oxygen Delivery Method: Circle system utilized Preoxygenation: Pre-oxygenation with 100% oxygen Induction Type: IV induction Ventilation: Mask ventilation without difficulty Laryngoscope Size: Mac and 3 Grade View: Grade I Tube type: Oral Tube size: 7.0 mm Number of attempts: 1 Airway Equipment and Method: Stylet Placement Confirmation: ETT inserted through vocal cords under direct vision,  positive ETCO2 and breath sounds checked- equal and bilateral Secured at: 21 cm Tube secured with: Tape Dental Injury: Teeth and Oropharynx as per pre-operative assessment

## 2016-12-25 NOTE — Interval H&P Note (Signed)
History and Physical Interval Note:  12/25/2016 7:02 AM  Kiara Grant  has presented today for surgery, with the diagnosis of breast cancer  The various methods of treatment have been discussed with the patient and family. After consideration of risks, benefits and other options for treatment, the patient has consented to  Procedure(s): MASTECTOMY WITH AXILLARY LYMPH NODE DISSECTION (Right) BREAST RECONSTRUCTION (Right) as a surgical intervention .  The patient's history has been reviewed, patient examined, no change in status, stable for surgery.  I have reviewed the patient's chart and labs.  Questions were answered to the patient's satisfaction.     Wallace Going

## 2016-12-25 NOTE — Anesthesia Post-op Follow-up Note (Signed)
Anesthesia QCDR form completed.        

## 2016-12-25 NOTE — Brief Op Note (Signed)
12/25/2016  1:16 PM  PATIENT:  Kiara Grant  46 y.o. female  PRE-OPERATIVE DIAGNOSIS:  breast cancer  POST-OPERATIVE DIAGNOSIS:  breast cancer  PROCEDURE:  Procedure(s) with comments: MASTECTOMY WITH AXILLARY LYMPH NODE DISSECTION (Right) BREAST RECONSTRUCTION (Right) LATISSIMUS FLAP TO BREAST (Right) - Right Latissimus myocutaneous flap with rotational flap from abdomen SIMPLE MASTECTOMY (Right) AXILLARY LYMPH NODE BIOPSY (Left)  SURGEON:  Surgeon(s) and Role: Panel 1:    * Lucita Montoya, [name], MD - Primary  Panel 2:    * Dillingham, Loel Lofty, DO - Primary     ANESTHESIA:   general  EBL:  Total I/O In: 1000 [I.V.:1000] Out: 300 [Urine:200; Blood:100]  BLOOD ADMINISTERED:none  DRAINS: (3) Jackson-Pratt drain(s) with closed bulb suction in the wounds   LOCAL MEDICATIONS USED:  NONE  SPECIMEN:  Excision  DISPOSITION OF SPECIMEN:  PATHOLOGY  COUNTS:  YES  TOURNIQUET:  * No tourniquets in log *  DICTATION: .Dragon Dictation  PLAN OF CARE: Admit for overnight observation  PATIENT DISPOSITION:  PACU - hemodynamically stable.   Delay start of Pharmacological VTE agent (>24hrs) due to surgical blood loss or risk of bleeding: no

## 2016-12-26 ENCOUNTER — Encounter: Payer: Self-pay | Admitting: General Surgery

## 2016-12-26 DIAGNOSIS — C50911 Malignant neoplasm of unspecified site of right female breast: Secondary | ICD-10-CM | POA: Diagnosis not present

## 2016-12-26 LAB — CBC
HCT: 28 % — ABNORMAL LOW (ref 35.0–47.0)
Hemoglobin: 9.7 g/dL — ABNORMAL LOW (ref 12.0–16.0)
MCH: 34.5 pg — AB (ref 26.0–34.0)
MCHC: 34.7 g/dL (ref 32.0–36.0)
MCV: 99.7 fL (ref 80.0–100.0)
PLATELETS: 169 10*3/uL (ref 150–440)
RBC: 2.81 MIL/uL — ABNORMAL LOW (ref 3.80–5.20)
RDW: 14.4 % (ref 11.5–14.5)
WBC: 6.1 10*3/uL (ref 3.6–11.0)

## 2016-12-26 LAB — CREATININE, SERUM
Creatinine, Ser: 0.81 mg/dL (ref 0.44–1.00)
GFR calc Af Amer: >60 mL/min (ref >60)
GFR calc non Af Amer: >60 mL/min (ref >60)

## 2016-12-26 MED ORDER — ACETAMINOPHEN 325 MG PO TABS
650.0000 mg | ORAL_TABLET | ORAL | Status: DC
  Administered 2016-12-26 – 2016-12-27 (×4): 650 mg via ORAL
  Filled 2016-12-26 (×4): qty 2

## 2016-12-26 NOTE — Progress Notes (Signed)
Pain well controlled with present regimen of IV tylenol and po oxycodone.  Low grade temp today, feels warm, but PO and AX temps < 98.4. Lungs: Clear. Flaps: Healthy. Drain sites: Clean, sero sang fluid. Reviewed OR findings. Case discussed w/ Dr. Rogue Bussing.  Will change to PO tylenol, get Incentive ordered yesterday, continue IV Kefzol per Plastics protocol and plan on D.C tomorrow.

## 2016-12-27 DIAGNOSIS — C50911 Malignant neoplasm of unspecified site of right female breast: Secondary | ICD-10-CM | POA: Diagnosis not present

## 2016-12-27 LAB — SURGICAL PATHOLOGY

## 2016-12-27 MED ORDER — OXYCODONE HCL 5 MG PO TABS: 5.0000 mg | ORAL_TABLET | ORAL | 0 refills | Status: DC | PRN

## 2016-12-27 NOTE — Final Progress Note (Signed)
AVSS. Modest pain. Drain volume decreasing. Lungs: Clear. Inspirex at 1800. Flap healthy. Tegaderm and wound vac dressings intact. Reviewed need for adequate fluids at home, no driving until approved by Dr. Marla Roe.

## 2016-12-27 NOTE — Care Management (Signed)
Patient to discharge home today with Prevena Vac in place. Patient to discharge with JP's in place.  Bedside RN to provide education on JP's prior to discharge.  Patient to follow up outpatient with surgery.

## 2016-12-27 NOTE — Progress Notes (Signed)
MD ordered patient to be discharged home.  Discharge instructions were reviewed with the patient and she voiced understanding.  Follow-up appointment was made.  Prescriptions given to the patient.  IV was removed with catheter intact.  Wound vac canister was switched to home vac.  Patient sent home with cups to empty JP dain, sheet to record the output and extra gauze if needed.  All patients questions were answered.  Patient left via wheelchair escorted by auxillary.

## 2016-12-28 ENCOUNTER — Telehealth: Payer: Self-pay | Admitting: General Surgery

## 2016-12-28 NOTE — Discharge Summary (Signed)
Physician Discharge Summary  Patient ID: LONIA ROANE MRN: [id number] DOB/AGE: [birth year] 46 y.o.  Admit date: 12/25/2016 Discharge date: 12/28/2016  Admission Diagnoses:Recurrent breast cancer  Discharge Diagnoses:  Active Problems:   Breast cancer [facility])   Discharged Condition: good  Hospital Course: The patient has extensive recurrence in the right breast with evidence of metastatic disease to the contralateral axilla. Preoperative biopsies had shown the edge of disease to be clear except superiorly, and plans were for salvage mastectomy and latissimus flap reconstruction due to the tumor volume and rapid progression across the chest wall. During her preoperative evaluation and presentation for surgery the left axillary nodal disease had become symptomatic and palpable. It was elected to excise this at the same time.  The patient was taken to the operating room and a salvage mastectomy completed. Multiple frozen section showed persistent disease superiorly and it was decided to not attempt to obtain clear margins as this would leave a large wound and delay reinstitution of hemo-therapy.  The patient did remarkably well with moderate pain. The latissimus flap remained healthy and there was no evidence of infection or inflammation. She was advanced to a regular diet and tolerated this well. Nausea well controlled with antiemetics.  Consults: None  Significant Diagnostic Studies: Pathology showed all margins involved with lymphangitic spread of tumor in spite of preoperative biopsies showing these areas to be negative. 4 axillary nodes on the left side positive for metastatic disease. Dense tissue in the right axilla positive for metastatic disease.  Treatments: surgery: Mastectomy with latissimus flap coverage.  Discharge Exam: Blood pressure 122/64, pulse 94, temperature 98.4 F (36.9 C), temperature source Oral, resp. rate 14, height 5\' 4"  (1.626 m), weight 120 lb (54.4 kg), last  menstrual period 04/16/2016, SpO2 96 %. General appearance: alert Resp: clear to auscultation bilaterally Chest wall: no tenderness Cardio: regular rate and rhythm, S1, S2 normal, no murmur, click, rub or gallop Incision/Wound: Clean and dry. Wound VAC and JP drains in place.  The patient had previously been instructed on the use of Keflex, 500 mg 4 times a day until follow-up with Dr. Marla Roe.  Disposition: 01-Home or Self Care  Discharge Instructions    Diet - low sodium heart healthy    Complete by:  As directed    Discharge instructions    Complete by:  As directed    Empty and record each drain separately 2-3 x/ day. Bring record to all office appointments.  Tylenol: One extra strength tablet 5 x/ day for the next 3 days, then as needed for comfort.  Oxycodone: IF needed for pain. This medication may constipate.  Valium as prescribed by Dr. Marla Roe if needed for muscle spasms.   Laxative of choice if needed.  Keep dressings dry.  Follow up with Dr. Marla Roe as scheduled.   Increase activity slowly    Complete by:  As directed      Allergies as of 12/27/2016      Reactions   Hydrocodone Nausea And Vomiting   Sulfa Antibiotics Nausea Only      Medication List    TAKE these medications   DANDELION ROOT PO Take 1 tablet by mouth 2 (two) times daily.   loratadine 10 MG tablet Commonly known as:  CLARITIN Take 10 mg by mouth daily as needed (after onpro injections).   ondansetron 8 MG tablet Commonly known as:  ZOFRAN Take 8 mg by mouth every 8 (eight) hours as needed for nausea or vomiting.   oxyCODONE 5  MG immediate release tablet Commonly known as:  Oxy IR/ROXICODONE Take 1 tablet (5 mg total) by mouth every 4 (four) hours as needed for moderate pain.   Turmeric 500 MG Caps Take 500 mg by mouth 2 (two) times daily.            Discharge Care Instructions        Start     Ordered   12/27/16 0000  oxyCODONE (OXY IR/ROXICODONE) 5 MG immediate  release tablet  Every 4 hours PRN     12/27/16 1041   12/27/16 0000  Increase activity slowly     12/27/16 1041   12/27/16 0000  Diet - low sodium heart healthy     12/27/16 1041   12/27/16 0000  Discharge instructions    Comments:  Empty and record each drain separately 2-3 x/ day. Bring record to all office appointments.  Tylenol: One extra strength tablet 5 x/ day for the next 3 days, then as needed for comfort.  Oxycodone: IF needed for pain. This medication may constipate.  Valium as prescribed by Dr. Marla Roe if needed for muscle spasms.   Laxative of choice if needed.  Keep dressings dry.  Follow up with Dr. Marla Roe as scheduled.   12/27/16 1041     Follow-up Information    Jerzy Crotteau, [name], MD. Go on 01/08/2017.   Specialties:  General Surgery, Radiology Why:  @10am  Contact information: [place] [address] [phone]        Wallace Going, DO. Go on 01/01/2017.   Specialty:  Plastic Surgery Why:  Dr. Marla Roe, Wednesday, 9/12 at 4 p.m. [phone]) [phone] Contact information: [place] [address] [phone]           Signed: Robert Bellow 12/28/2016, 10:40 AM

## 2016-12-28 NOTE — Telephone Encounter (Signed)
Pathology results reviewed. 9 cm of tumor within the breast itself. All margins positive (in spite of previous negative skin biopsies well within the wide excision site.  Did well last night. Minimal pain. Biotics.  Follow-up with plastics September 11, follow-up here September 19.

## 2017-01-08 ENCOUNTER — Encounter: Payer: Self-pay | Admitting: General Surgery

## 2017-01-08 ENCOUNTER — Ambulatory Visit (INDEPENDENT_AMBULATORY_CARE_PROVIDER_SITE_OTHER): Payer: BLUE CROSS/BLUE SHIELD | Admitting: General Surgery

## 2017-01-08 VITALS — BP 132/76 | HR 66 | Resp 12 | Ht 64.0 in | Wt 119.0 lb

## 2017-01-08 DIAGNOSIS — Z171 Estrogen receptor negative status [ER-]: Secondary | ICD-10-CM

## 2017-01-08 DIAGNOSIS — C50011 Malignant neoplasm of nipple and areola, right female breast: Secondary | ICD-10-CM

## 2017-01-08 NOTE — Progress Notes (Signed)
Patient ID: Kiara Grant, female   DOB: 1970/08/22, 46 y.o.   MRN: [id number]  Chief Complaint  Patient presents with  . Routine Post Op    HPI Kiara Grant is a 46 y.o. female is here today for a post op left mastectomy done 12/25/16. Patient saw Dr. Marla Roe yesterday 01/07/18 and removed drain1 that was on her right side, still has 2 drains in place. Drain sheet present.  Her husband Richardson Landry is present. HPI  Past Medical History:  Diagnosis Date  . Anemia   . BRCA negative 03/2014   Testing completed at [facility]  . Breast cancer ([place]) 03/2014   Right, T1b, N1 prYpT1b,N1a, ER neg, PR < 10 %, Her 2 neu not overexpressed.   . Cancer [facility]) 03-24-14   Right breast, microcalcifications. ER negative, PR less than 10%, HER-2/neu not amplified.  . Melanoma [facility]) Sept 2011   Left neck T1a, 0.35 mm. Treated by Mohs injury [facility].  . Personal history of chemotherapy   . PONV (postoperative nausea and vomiting)    WITH 1ST PORT PLACEMENT ONLY    Past Surgical History:  Procedure Laterality Date  . AXILLARY LYMPH NODE BIOPSY Left 12/25/2016   Procedure: AXILLARY LYMPH NODE BIOPSY;  Surgeon: Robert Bellow, MD;  Location: [facility] ORS;  Service: General;  Laterality: Left;  . BREAST BIOPSY Right 03-24-14   +  . BREAST EXCISIONAL BIOPSY Right 10/28/2014   Lumpectomy +  . BREAST LUMPECTOMY WITH SENTINEL LYMPH NODE BIOPSY Right 10/28/2014   Procedure: right breast wide excision with sentinel node biopsy, mastoplasty, axillary dissection ;  Surgeon: Robert Bellow, MD;  Location: [facility] ORS;  Service: General;  Laterality: Right;  . BREAST RECONSTRUCTION Right 12/25/2016   Procedure: BREAST RECONSTRUCTION;  Surgeon: Wallace Going, DO;  Location: [facility] ORS;  Service: Plastics;  Laterality: Right;  . INCISIONAL BREAST BIOPSY Right 09/03/2016   Punch biopsy, invasive mammary carcinoma, Triple negative.   Marland Kitchen LATISSIMUS FLAP TO BREAST Right 12/25/2016   Procedure: LATISSIMUS FLAP  TO BREAST;  Surgeon: Wallace Going, DO;  Location: [facility] ORS;  Service: Plastics;  Laterality: Right;  Right Latissimus myocutaneous flap with rotational flap from abdomen  . MASTECTOMY WITH AXILLARY LYMPH NODE DISSECTION Right 12/25/2016   Procedure: MASTECTOMY WITH AXILLARY LYMPH NODE DISSECTION;  Surgeon: Robert Bellow, MD;  Location: [facility] ORS;  Service: General;  Laterality: Right;  . MELANOMA EXCISION  Nov 2011   neck  . PORT-A-CATH REMOVAL  2017  . [facility] PLACEMENT  Dec 2015  . PORTACATH PLACEMENT N/A 09/24/2016   Procedure: INSERTION PORT-A-CATH;  Surgeon: Robert Bellow, MD;  Location: [facility] ORS;  Service: General;  Laterality: N/A;  . SIMPLE MASTECTOMY WITH AXILLARY SENTINEL NODE BIOPSY Right 12/25/2016   Procedure: SIMPLE MASTECTOMY;  Surgeon: Robert Bellow, MD;  Location: [facility] ORS;  Service: General;  Laterality: Right;    Family History  Problem Relation Age of Onset  . Cancer Mother        lung ? mid 34    Social History Social History  Substance Use Topics  . Smoking status: Never Smoker  . Smokeless tobacco: Never Used  . Alcohol use No    Allergies  Allergen Reactions  . Hydrocodone Nausea And Vomiting  . Sulfa Antibiotics Nausea Only    Current Outpatient Prescriptions  Medication Sig Dispense Refill  . loratadine (CLARITIN) 10 MG tablet Take 10 mg by mouth daily as needed (after onpro injections).    Marland Kitchen  Misc Natural Products (DANDELION ROOT PO) Take 1 tablet by mouth 2 (two) times daily.     . ondansetron (ZOFRAN) 8 MG tablet Take 8 mg by mouth every 8 (eight) hours as needed for nausea or vomiting.    Marland Kitchen oxyCODONE (OXY IR/ROXICODONE) 5 MG immediate release tablet Take 1 tablet (5 mg total) by mouth every 4 (four) hours as needed for moderate pain. 30 tablet 0  . Turmeric 500 MG CAPS Take 500 mg by mouth 2 (two) times daily.     No current facility-administered medications for this visit.    Facility-Administered Medications Ordered in Other  Visits  Medication Dose Route Frequency Provider Last Rate Last Dose  . sodium chloride 0.9 % injection 10 mL  10 mL Intravenous PRN Evlyn Kanner, NP   10 mL at 11/08/14 1207    Review of Systems Review of Systems  Constitutional: Negative.   Respiratory: Negative.   Cardiovascular: Negative.     Blood pressure 132/76, pulse 66, resp. rate 12, height 5' 4"  (1.626 m), weight 119 lb (54 kg).  Physical Exam Physical Exam  Constitutional: She is oriented to person, place, and time. She appears well-developed and well-nourished.  Eyes: Conjunctivae are normal. No scleral icterus.  Neck: Neck supple.  Pulmonary/Chest:         drained fluid left axilla  Neurological: She is alert and oriented to person, place, and time.  Skin: Skin is warm and dry.    Data Reviewed A. SKIN, RIGHT BREAST AT 12:00; BIOPSY:  - METASTATIC CARCINOMA INVOLVING DERMAL LYMPHATICS.   B. SKIN, RIGHT BREAST, SPECIMEN 2; EXCISION:  - METASTATIC CARCINOMA INVOLVING DERMAL AND SUBCUTANEOUS LYMPHATICS.   C. BREAST, RIGHT; MASTECTOMY:  - INVASIVE MAMMARY CARCINOMA, GRADE 3, WITH INNUMERABLE NODULES  INFILTRATING CENTRAL BREAST AND NIPPLE/AREOLA, SPANNING AT LEAST 9 CM.  - EXTENSIVE LYMPHATIC INVASION INVOLVING SKIN AND BREAST, SEE COMMENT.   Comment:  Sections of the breast skin margins at 3:00, 6:00, and 9:00 show  carcinoma within dermal lymphatics. Carcinoma is present less than 0.2  mm from the cauterized deep margin, but no invasion of skeletal muscle  is seen.   D. SKIN, RIGHT BREAST STITCH AT 12:00 SPECIMEN 3; EXCISION:  - METASTATIC CARCINOMA INVOLVING DERMAL AND SUBCUTANEOUS LYMPHATICS.   E. SOFT TISSUE, RIGHT AXILLA; EXCISION:  - METASTATIC CARCINOMA.   F. LYMPH NODES, LEFT AXILLA; EXCISION:  - METASTATIC CARCINOMA IN FOUR OF FOUR LYMPH NODES (4/4).   Assessment    Doing well status post salvage mastectomy with latissimus flap reconstruction.    Plan    Follow-up next week  with Dr. Marla Roe.  Follow up in 2 weeks.      HPI, Physical Exam, Assessment and Plan have been scribed under the direction and in the presence of Hervey Ard, MD.  Verlene Mayer, CMA  I have completed the exam and reviewed the above documentation for accuracy and completeness.  I agree with the above.  Haematologist has been used and any errors in dictation or transcription are unintentional.  Hervey Ard, M.D., F.A.C.S.    Robert Bellow 01/08/2017, 5:09 PM

## 2017-01-08 NOTE — Patient Instructions (Signed)
Follow-up in 2 weeks

## 2017-01-15 ENCOUNTER — Telehealth: Payer: Self-pay | Admitting: Internal Medicine

## 2017-01-15 NOTE — Telephone Encounter (Signed)
FYI- Spoke to Dr. Bary Castilla; pt has recently met with plastic surgery Dr.Dillingham [wants to hold off chemo for 3 more weeks from 9/26]. Pt has appt to see me on 10/02. Will discuss chemo options with pt at next visit.

## 2017-01-20 ENCOUNTER — Ambulatory Visit (INDEPENDENT_AMBULATORY_CARE_PROVIDER_SITE_OTHER): Payer: BLUE CROSS/BLUE SHIELD | Admitting: General Surgery

## 2017-01-20 ENCOUNTER — Encounter: Payer: Self-pay | Admitting: General Surgery

## 2017-01-20 VITALS — BP 100/60 | HR 78 | Resp 14 | Ht 64.0 in | Wt 119.0 lb

## 2017-01-20 DIAGNOSIS — Z171 Estrogen receptor negative status [ER-]: Secondary | ICD-10-CM | POA: Diagnosis not present

## 2017-01-20 DIAGNOSIS — C50011 Malignant neoplasm of nipple and areola, right female breast: Secondary | ICD-10-CM | POA: Diagnosis not present

## 2017-01-20 NOTE — Patient Instructions (Signed)
You may shower. Remove your waterproof dressing in two days.

## 2017-01-20 NOTE — Progress Notes (Signed)
Patient ID: Kiara Grant, female   DOB: May 02, 1970, 46 y.o.   MRN: [id number]  Chief Complaint  Patient presents with  . Routine Post Op    right mastectomy    HPI Kiara Grant is a 46 y.o. female here for a post op right mastectomy with immediate reconstructions. She reports that she is doing well but still taking her pain medication at night to sleep. She does report some right arm swelling. She is here today with her friend Suanne Marker.  HPI  Past Medical History:  Diagnosis Date  . Anemia   . BRCA negative 03/2014   Testing completed at [facility]  . Breast cancer ([place]) 03/2014   Right, T1b, N1 prYpT1b,N1a, ER neg, PR < 10 %, Her 2 neu not overexpressed.   . Cancer [facility]) 03-24-14   Right breast, microcalcifications. ER negative, PR less than 10%, HER-2/neu not amplified.  . Melanoma [facility]) Sept 2011   Left neck T1a, 0.35 mm. Treated by Mohs injury [facility].  . Personal history of chemotherapy   . PONV (postoperative nausea and vomiting)    WITH 1ST PORT PLACEMENT ONLY    Past Surgical History:  Procedure Laterality Date  . AXILLARY LYMPH NODE BIOPSY Left 12/25/2016   Procedure: AXILLARY LYMPH NODE BIOPSY;  Surgeon: Robert Bellow, MD;  Location: [facility] ORS;  Service: General;  Laterality: Left;  . BREAST BIOPSY Right 03-24-14   +  . BREAST EXCISIONAL BIOPSY Right 10/28/2014   Lumpectomy +  . BREAST LUMPECTOMY WITH SENTINEL LYMPH NODE BIOPSY Right 10/28/2014   Procedure: right breast wide excision with sentinel node biopsy, mastoplasty, axillary dissection ;  Surgeon: Robert Bellow, MD;  Location: [facility] ORS;  Service: General;  Laterality: Right;  . BREAST RECONSTRUCTION Right 12/25/2016   Procedure: BREAST RECONSTRUCTION;  Surgeon: Wallace Going, DO;  Location: [facility] ORS;  Service: Plastics;  Laterality: Right;  . INCISIONAL BREAST BIOPSY Right 09/03/2016   Punch biopsy, invasive mammary carcinoma, Triple negative.   Marland Kitchen LATISSIMUS FLAP TO BREAST Right 12/25/2016    Procedure: LATISSIMUS FLAP TO BREAST;  Surgeon: Wallace Going, DO;  Location: [facility] ORS;  Service: Plastics;  Laterality: Right;  Right Latissimus myocutaneous flap with rotational flap from abdomen  . MASTECTOMY WITH AXILLARY LYMPH NODE DISSECTION Right 12/25/2016   Procedure: MASTECTOMY WITH AXILLARY LYMPH NODE DISSECTION;  Surgeon: Robert Bellow, MD;  Location: [facility] ORS;  Service: General;  Laterality: Right;  . MELANOMA EXCISION  Nov 2011   neck  . PORT-A-CATH REMOVAL  2017  . [facility] PLACEMENT  Dec 2015  . PORTACATH PLACEMENT N/A 09/24/2016   Procedure: INSERTION PORT-A-CATH;  Surgeon: Robert Bellow, MD;  Location: [facility] ORS;  Service: General;  Laterality: N/A;  . SIMPLE MASTECTOMY WITH AXILLARY SENTINEL NODE BIOPSY Right 12/25/2016   Procedure: SIMPLE MASTECTOMY;  Surgeon: Robert Bellow, MD;  Location: [facility] ORS;  Service: General;  Laterality: Right;    Family History  Problem Relation Age of Onset  . Cancer Mother        lung ? mid 28    Social History Social History  Substance Use Topics  . Smoking status: Never Smoker  . Smokeless tobacco: Never Used  . Alcohol use No    Allergies  Allergen Reactions  . Hydrocodone Nausea And Vomiting  . Sulfa Antibiotics Nausea Only    Current Outpatient Prescriptions  Medication Sig Dispense Refill  . diazepam (VALIUM) 2 MG tablet Take 2 mg by mouth as needed.  0  . loratadine (CLARITIN) 10 MG tablet Take 10 mg by mouth daily as needed (after onpro injections).    . Misc Natural Products (DANDELION ROOT PO) Take 1 tablet by mouth 2 (two) times daily.     . ondansetron (ZOFRAN) 8 MG tablet Take 8 mg by mouth every 8 (eight) hours as needed for nausea or vomiting.    Marland Kitchen oxyCODONE (OXY IR/ROXICODONE) 5 MG immediate release tablet Take 1 tablet (5 mg total) by mouth every 4 (four) hours as needed for moderate pain. 30 tablet 0  . Turmeric 500 MG CAPS Take 500 mg by mouth 2 (two) times daily.     No current  facility-administered medications for this visit.    Facility-Administered Medications Ordered in Other Visits  Medication Dose Route Frequency Provider Last Rate Last Dose  . sodium chloride 0.9 % injection 10 mL  10 mL Intravenous PRN Evlyn Kanner, NP   10 mL at 11/08/14 1207    Review of Systems Review of Systems  Constitutional: Negative.   Respiratory: Negative.   Cardiovascular: Negative.     Blood pressure 100/60, pulse 78, resp. rate 14, height 5' 4"  (1.626 m), weight 119 lb (54 kg).  Physical Exam Physical Exam  Constitutional: She is oriented to person, place, and time. She appears well-developed and well-nourished.  Pulmonary/Chest:    Neurological: She is alert and oriented to person, place, and time.  Skin: Skin is warm and dry.    Data Reviewed Dr. Eusebio Friendly notes.  Assessment    Doing well status post latissimus flap for attempted breast salvage for recurrent right breast cancer.  New dermal changes suggestive of progressive cancer.    Plan    Patient was amenable to a punch biopsy of the area in the lower aspect of the chest wall. 1 mL of 1% plain Xylocaine utilized well tolerated. 2.5 mm punch biopsy obtained. Defect closed with a single 4-0 Prolene suture. Telfa and Tegaderm dressing applied.  The suture needs to be removed in a week. If she is back to Dr. Eusebio Friendly office will ask her to do this at that time, otherwise the patient will return here for suture removal.  She'll be contacted when pathology is available.     Follow up appointment to be announced.    HPI, Physical Exam, Assessment and Plan have been scribed under the direction and in the presence of Robert Bellow, MD  Concepcion Living, LPN  I have completed the exam and reviewed the above documentation for accuracy and completeness.  I agree with the above.  Haematologist has been used and any errors in dictation or transcription are unintentional.  Hervey Ard, M.D., F.A.C.S.   Robert Bellow 01/20/2017, 9:36 PM

## 2017-01-21 ENCOUNTER — Telehealth: Payer: Self-pay | Admitting: Internal Medicine

## 2017-01-21 ENCOUNTER — Inpatient Hospital Stay: Payer: BLUE CROSS/BLUE SHIELD

## 2017-01-21 ENCOUNTER — Other Ambulatory Visit: Payer: Self-pay

## 2017-01-21 ENCOUNTER — Inpatient Hospital Stay: Payer: BLUE CROSS/BLUE SHIELD | Attending: Internal Medicine | Admitting: Internal Medicine

## 2017-01-21 VITALS — BP 108/72 | HR 83 | Temp 96.7°F | Resp 18 | Wt 117.2 lb

## 2017-01-21 DIAGNOSIS — Z9221 Personal history of antineoplastic chemotherapy: Secondary | ICD-10-CM

## 2017-01-21 DIAGNOSIS — C770 Secondary and unspecified malignant neoplasm of lymph nodes of head, face and neck: Secondary | ICD-10-CM | POA: Diagnosis not present

## 2017-01-21 DIAGNOSIS — Z9011 Acquired absence of right breast and nipple: Secondary | ICD-10-CM | POA: Diagnosis not present

## 2017-01-21 DIAGNOSIS — C50511 Malignant neoplasm of lower-outer quadrant of right female breast: Secondary | ICD-10-CM | POA: Diagnosis not present

## 2017-01-21 DIAGNOSIS — Z5111 Encounter for antineoplastic chemotherapy: Secondary | ICD-10-CM | POA: Diagnosis not present

## 2017-01-21 DIAGNOSIS — Z85820 Personal history of malignant melanoma of skin: Secondary | ICD-10-CM | POA: Diagnosis not present

## 2017-01-21 DIAGNOSIS — Z79899 Other long term (current) drug therapy: Secondary | ICD-10-CM | POA: Diagnosis not present

## 2017-01-21 DIAGNOSIS — Z171 Estrogen receptor negative status [ER-]: Secondary | ICD-10-CM | POA: Diagnosis not present

## 2017-01-21 LAB — CBC WITH DIFFERENTIAL/PLATELET
Basophils Absolute: 0.1 10*3/uL (ref 0–0.1)
Basophils Relative: 1 %
EOS PCT: 4 %
Eosinophils Absolute: 0.2 10*3/uL (ref 0–0.7)
HCT: 37.7 % (ref 35.0–47.0)
Hemoglobin: 12.9 g/dL (ref 12.0–16.0)
LYMPHS PCT: 21 %
Lymphs Abs: 1 10*3/uL (ref 1.0–3.6)
MCH: 32.6 pg (ref 26.0–34.0)
MCHC: 34.1 g/dL (ref 32.0–36.0)
MCV: 95.5 fL (ref 80.0–100.0)
MONO ABS: 0.4 10*3/uL (ref 0.2–0.9)
MONOS PCT: 8 %
NEUTROS ABS: 3.2 10*3/uL (ref 1.4–6.5)
Neutrophils Relative %: 66 %
PLATELETS: 175 10*3/uL (ref 150–440)
RBC: 3.95 MIL/uL (ref 3.80–5.20)
RDW: 12.4 % (ref 11.5–14.5)
WBC: 4.8 10*3/uL (ref 3.6–11.0)

## 2017-01-21 LAB — COMPREHENSIVE METABOLIC PANEL
ALT: 11 U/L — AB (ref 14–54)
AST: 21 U/L (ref 15–41)
Albumin: 4.3 g/dL (ref 3.5–5.0)
Alkaline Phosphatase: 59 U/L (ref 38–126)
Anion gap: 9 (ref 5–15)
BUN: 12 mg/dL (ref 6–20)
CHLORIDE: 99 mmol/L — AB (ref 101–111)
CO2: 30 mmol/L (ref 22–32)
CREATININE: 0.79 mg/dL (ref 0.44–1.00)
Calcium: 9.4 mg/dL (ref 8.9–10.3)
GFR calc Af Amer: >60 mL/min (ref >60)
Glucose, Bld: 100 mg/dL — ABNORMAL HIGH (ref 65–99)
POTASSIUM: 4.5 mmol/L (ref 3.5–5.1)
SODIUM: 138 mmol/L (ref 135–145)
Total Bilirubin: 0.5 mg/dL (ref 0.3–1.2)
Total Protein: 8 g/dL (ref 6.5–8.1)

## 2017-01-21 NOTE — Progress Notes (Signed)
DISCONTINUE ON PATHWAY REGIMEN - Breast     A cycle is every 28 days (3 weeks on and 1 week off):     Paclitaxel   **Always confirm dose/schedule in your pharmacy ordering system**    REASON: Disease Progression PRIOR TREATMENT: [id number]: Paclitaxel 80 mg/m2 D1, 8, 15 q28 Days Until Progression or Unacceptable Toxicity TREATMENT RESPONSE: Progressive Disease (PD)  Breast - No Medical Intervention - Off Treatment.  Patient Characteristics: Metastatic Chemotherapy, HER2 Negative/Unknown/Equivocal, ER Negative/Unknown, Second Line, Prior Paclitaxel Therapeutic Status: Distant Metastases BRCA Mutation Status: Absent ER Status: Negative (-) HER2 Status: Negative (-) Would you be surprised if this patient died  in the next year<= I would be surprised if this patient died in the next year PR Status: Negative (-) Line of therapy: Second [employer]

## 2017-01-21 NOTE — Progress Notes (Signed)
Here for follow up.stated she is doing well. Taking pain meds at hs

## 2017-01-21 NOTE — Telephone Encounter (Signed)
Spoke to pt re: my discussion with Dr.Dellingham. She agrees with current plan.

## 2017-01-21 NOTE — Progress Notes (Signed)
DISCONTINUE ON PATHWAY REGIMEN - Breast  No Medical Intervention - Off Treatment.  REASON: Disease Progression PRIOR TREATMENT: Off Treatment  START ON PATHWAY REGIMEN - Breast     A cycle is every 21 days:     Capecitabine   **Always confirm dose/schedule in your pharmacy ordering system**    Patient Characteristics: Metastatic Chemotherapy, HER2 Negative/Unknown/Equivocal, ER Negative/Unknown, Second Line, Prior Paclitaxel Therapeutic Status: Distant Metastases BRCA Mutation Status: Absent ER Status: Negative (-) HER2 Status: Negative (-) Would you be surprised if this patient died  in the next year<= I would be surprised if this patient died in the next year PR Status: Negative (-) Line of therapy: Second Line Intent of Therapy: Non-Curative / Palliative Intent, Discussed with Patient

## 2017-01-21 NOTE — Progress Notes (Signed)
[facility] OFFICE PROGRESS NOTE  Patient Care Team: Copland, [name], MD (General Surgery) Copland, Ginette Otto as Referring Physician (Physician Assistant) [name], MD (Oncology)  Cancer Staging No matching staging information was found for the patient.   Oncology History   # DEC 2015-  Carcinoma of right breast T1 be N1 M0 tumor stage II diagnosis in December of 2015 (right lower and outer quadrant). Needle biopsy positive of the right, Axillary lymph node (December, 2015), positive for metastatic breast carcinoma. Estrogen receptor negative.  Progesterone receptor weakly positive.  HER-2/neu receptor negative. # JAN 2016- NEO-ADJ CHEMO- Cytoxan, Adriamycin,  followed by Taxol (neoadjuvant therapy) April 25, 2014., 3.finished 4 cycles of chemotherapy with Cytoxan and Adriamycin on 7 th  March, 2016 4.started Taxol on a weekly basis from March 29 5.  Patient will finish weekly Taxol on 13th of June, 2016 6.  Patient had lumpectomy and axillary node evaluation. ypT1b [46m]  ypN1 c (3 /12LN)MO [Dr.Byrnett].  (July, 2016) s/p RT.   # MAY 2018- RECURRENT/ IPSILATERAL BREAST- may 2018- PET- ipsilateral & contralateral axillary/supraclavicular; right breast thickening.   [name]    -------------------------------------------------------------------------- # [employer]- No targets**  7.  Genetic mutation.  No clinically significant mutation identified (December, 2015)  # MELANOMA left neck [Dr.Graham- 2011]     Carcinoma of lower-outer quadrant of right breast in female, estrogen receptor negative ([place]     INTERVAL HISTORY:  Kiara MACIEJEWSKI89y.o.  female  the above history of recurrent/ metastatic ER negative/HER-2/neu negative- currently s/p chemotherapy with weekly Taxol/carboplatin- is post cycle #3 day- 184 Last treatment August 9th.  Patient underwent  palliative mastectomy; with reconstruction/flap- given the [place] on [place] 5[place]th.  Unfortunately margins were positive.   Patient noted to have worsening rash superior to the incision/ and also inferiorly- approximately 1 week ago.  Patient was seen by Dr. BCharissa Bashyesterday morning- had a punch biopsy of the inferior lesion. She was evaluated by plastic surgery this morning.   She complains of mild back pain. Otherwise denies any headaches. Denies any nausea vomiting.  Denies any significant tingling and numbness. Denies any nausea vomiting. Denies any bone pain. No fever no chills.   REVIEW OF SYSTEMS:  A complete 10 point review of system is done which is negative except mentioned above/history of present illness.   PAST MEDICAL HISTORY :  Past Medical History:  Diagnosis Date  . Anemia   . BRCA negative 03/2014   [facility]  . [place]   Right, T1b, N1 prYpT1b,N1a, ER neg, PR < 10 %, Her 2 neu not overexpressed.   . Cancer ([facility] 03-24-14   Right breast, microcalcifications. ER negative, PR less than 10%, HER-2/neu not amplified.  . Melanoma ([facility] Sept 2011   Left neck T1a, 0.35 mm. [facility]  . Personal history of chemotherapy   . PONV (postoperative nausea and vomiting)    WITH 1ST PORT PLACEMENT ONLY    PAST SURGICAL HISTORY :   Past Surgical History:  Procedure Laterality Date  . AXILLARY LYMPH NODE BIOPSY Left 12/25/2016   Procedure: AXILLARY LYMPH NODE BIOPSY;  Surgeon: BRobert Bellow MD;  Location: [facility] ORS;  Service: General;  Laterality: Left;  . BREAST BIOPSY Right 03-24-14   +  . BREAST EXCISIONAL BIOPSY Right  10/28/2014   Lumpectomy +  . BREAST LUMPECTOMY WITH SENTINEL LYMPH NODE BIOPSY Right 10/28/2014   Procedure: right breast wide excision with sentinel node biopsy, mastoplasty, axillary dissection ;  Surgeon: Robert Bellow, MD;  Location: [facility]  ORS;  Service: General;  Laterality: Right;  . BREAST RECONSTRUCTION Right 12/25/2016   Procedure: BREAST RECONSTRUCTION;  Surgeon: Wallace Going, DO;  Location: [facility] ORS;  Service: Plastics;  Laterality: Right;  . INCISIONAL BREAST BIOPSY Right 09/03/2016   Punch biopsy, invasive mammary carcinoma, Triple negative.   Marland Kitchen LATISSIMUS FLAP TO BREAST Right 12/25/2016   Procedure: LATISSIMUS FLAP TO BREAST;  Surgeon: Wallace Going, DO;  Location: [facility] ORS;  Service: Plastics;  Laterality: Right;  Right Latissimus myocutaneous flap with rotational flap from abdomen  . MASTECTOMY WITH AXILLARY LYMPH NODE DISSECTION Right 12/25/2016   Procedure: MASTECTOMY WITH AXILLARY LYMPH NODE DISSECTION;  Surgeon: Robert Bellow, MD;  Location: [facility] ORS;  Service: General;  Laterality: Right;  . MELANOMA EXCISION  Nov 2011   neck  . PORT-A-CATH REMOVAL  2017  . [facility] PLACEMENT  Dec 2015  . PORTACATH PLACEMENT N/A 09/24/2016   Procedure: INSERTION PORT-A-CATH;  Surgeon: Robert Bellow, MD;  Location: [facility] ORS;  Service: General;  Laterality: N/A;  . SIMPLE MASTECTOMY WITH AXILLARY SENTINEL NODE BIOPSY Right 12/25/2016   Procedure: SIMPLE MASTECTOMY;  Surgeon: Robert Bellow, MD;  Location: [facility] ORS;  Service: General;  Laterality: Right;    FAMILY HISTORY :   Family History  Problem Relation Age of Onset  . Cancer Mother        lung ? mid 19    SOCIAL HISTORY:   Social History  Substance Use Topics  . Smoking status: Never Smoker  . Smokeless tobacco: Never Used  . Alcohol use No    ALLERGIES:  is allergic to hydrocodone and sulfa antibiotics.  MEDICATIONS:  Current Outpatient Prescriptions  Medication Sig Dispense Refill  . oxyCODONE (OXY IR/ROXICODONE) 5 MG immediate release tablet Take 1 tablet (5 mg total) by mouth every 4 (four) hours as needed for moderate pain. 30 tablet 0  . diazepam (VALIUM) 2 MG tablet Take 2 mg by mouth as needed.  0  . loratadine (CLARITIN) 10 MG  tablet Take 10 mg by mouth daily as needed (after onpro injections).    . Misc Natural Products (DANDELION ROOT PO) Take 1 tablet by mouth 2 (two) times daily.     . ondansetron (ZOFRAN) 8 MG tablet Take 8 mg by mouth every 8 (eight) hours as needed for nausea or vomiting.    . Turmeric 500 MG CAPS Take 500 mg by mouth 2 (two) times daily.     No current facility-administered medications for this visit.    Facility-Administered Medications Ordered in Other Visits  Medication Dose Route Frequency Provider Last Rate Last Dose  . sodium chloride 0.9 % injection 10 mL  10 mL Intravenous PRN Evlyn Kanner, NP   10 mL at 11/08/14 1207    PHYSICAL EXAMINATION: ECOG PERFORMANCE STATUS: 0 - Asymptomatic  BP 108/72 (BP Location: Left Arm, Patient Position: Sitting) Comment: left arm  Pulse 83   Temp (!) 96.7 F (35.9 C) (Tympanic)   Resp 18   Wt 117 lb 2.8 oz (53.1 kg)   BMI 20.11 kg/m   Filed Weights   01/21/17 1015  Weight: 117 lb 2.8 oz (53.1 kg)    GENERAL: Well-nourished well-developed; Alert, no distress and comfortable.   With her  husband.  EYES: no pallor or icterus OROPHARYNX: no thrush or ulceration; good dentition  NECK: supple, no masses felt LYMPH:  no palpable lymphadenopathy in the cervical Or inguinal region. Positive for a centimeter sized lymph node in the left axilla. None on the right. LUNGS: clear to auscultation and  No wheeze or crackles HEART/CVS: regular rate & rhythm and no murmurs; No lower extremity edema ABDOMEN:abdomen soft, non-tender and normal bowel sounds Musculoskeletal:no cyanosis of digits and no clubbing  PSYCH: alert & oriented x 3 with fluent speech NEURO: no focal motor/sensory deficits SKIN:  Mastectomy and flap noted. Multiple erythematous/papules noted superior margin [above the clavicle]; inferior margin.   LABORATORY DATA:  I have reviewed the data as listed    Component Value Date/Time   NA 138 01/21/2017 1048   NA 138 08/16/2014  1332   K 4.5 01/21/2017 1048   K 3.7 08/16/2014 1332   CL 99 (L) 01/21/2017 1048   CL 106 08/16/2014 1332   CO2 30 01/21/2017 1048   CO2 27 08/16/2014 1332   GLUCOSE 100 (H) 01/21/2017 1048   GLUCOSE 118 (H) 08/16/2014 1332   BUN 12 01/21/2017 1048   BUN 9 08/16/2014 1332   CREATININE 0.79 01/21/2017 1048   CREATININE 0.71 08/16/2014 1332   CALCIUM 9.4 01/21/2017 1048   CALCIUM 9.0 08/16/2014 1332   PROT 8.0 01/21/2017 1048   PROT 7.2 08/16/2014 1332   ALBUMIN 4.3 01/21/2017 1048   ALBUMIN 4.4 08/16/2014 1332   AST 21 01/21/2017 1048   AST 28 08/16/2014 1332   ALT 11 (L) 01/21/2017 1048   ALT 31 08/16/2014 1332   ALKPHOS 59 01/21/2017 1048   ALKPHOS 45 08/16/2014 1332   BILITOT 0.5 01/21/2017 1048   BILITOT 0.4 08/16/2014 1332   GFRNONAA >60 01/21/2017 1048   GFRNONAA >60 08/16/2014 1332   GFRAA >60 01/21/2017 1048   GFRAA >60 08/16/2014 1332    No results found for: SPEP, UPEP  Lab Results  Component Value Date   WBC 4.8 01/21/2017   NEUTROABS 3.2 01/21/2017   HGB 12.9 01/21/2017   HCT 37.7 01/21/2017   MCV 95.5 01/21/2017   PLT 175 01/21/2017      Chemistry      Component Value Date/Time   NA 138 01/21/2017 1048   NA 138 08/16/2014 1332   K 4.5 01/21/2017 1048   K 3.7 08/16/2014 1332   CL 99 (L) 01/21/2017 1048   CL 106 08/16/2014 1332   CO2 30 01/21/2017 1048   CO2 27 08/16/2014 1332   BUN 12 01/21/2017 1048   BUN 9 08/16/2014 1332   CREATININE 0.79 01/21/2017 1048   CREATININE 0.71 08/16/2014 1332      Component Value Date/Time   CALCIUM 9.4 01/21/2017 1048   CALCIUM 9.0 08/16/2014 1332   ALKPHOS 59 01/21/2017 1048   ALKPHOS 45 08/16/2014 1332   AST 21 01/21/2017 1048   AST 28 08/16/2014 1332   ALT 11 (L) 01/21/2017 1048   ALT 31 08/16/2014 1332   BILITOT 0.5 01/21/2017 1048   BILITOT 0.4 08/16/2014 1332     IMPRESSION: Chest Impression:  1. Small axillary lymph nodes hypermetabolic on comparison PET-CT scan are not significantly  changed. One lymph node in LEFT axilla does measure slightly larger. 2. No mediastinal lymphadenopathy.  No suspicious pulmonary nodules. 3. Mild skin thickening in the RIGHT breast similar. 4. Consolidation in the RIGHT lung apex consistent with radiation change.  Abdomen / Pelvis Impression:  1. No  evidence of metastatic disease in the abdomen pelvis. 2. No evidence skeletal metastatic disease.   Electronically Signed   By: Suzy Bouchard M.D.   On: 12/11/2016 14:30  RADIOGRAPHIC STUDIES: I have personally reviewed the radiological images as listed and agreed with the findings in the report. No results found.   ASSESSMENT & PLAN:  Carcinoma of lower-outer quadrant of right breast in female, estrogen receptor negative ([place]) # STAGE IV RECURRENT breast cancer/ TRIPLE NEGATIVE- poor response to [place] Taxol chemotherapy [last chemo on 8/09]]; currently status post palliative mastectomy/flap.   # In the interim- unfortunately patient is noticed to have progressive cutaneous disease. Discussed with the patient/husband my concerns the need to start systemic chemotherapy as soon as possible.  # I would recommend starting Eribulin D1 day 8 every 21 days. Discussed the potential side effects including but not limited to neutropenia tingling and numbness.   # I would recommend a PET scan-with a progressive disease as a recent August 2018 CT scan did not show any significant visceral disease. We will get labs today.  # Discussed with Dr.Dillingham- who recommends waiting at least 2 more weeks to help heal the incision.   # patient will follow up with me in 1 week; chemo [tentative].    Orders Placed This Encounter  Procedures  . NM PET Image Restag (PS) Skull Base To Thigh    Standing Status:   Future    Standing Expiration Date:   01/21/2018    Order Specific Question:   If indicated for the ordered procedure, I authorize the administration of a radiopharmaceutical per Radiology  protocol    Answer:   Yes    Order Specific Question:   Is the patient pregnant?    Answer:   No    Order Specific Question:   Preferred imaging location?    Answer:   [facility]    Order Specific Question:   Radiology Contrast Protocol - do NOT remove file path    Answer:   [email or web address]  . CBC with Differential    Standing Status:   Future    Number of Occurrences:   1    Standing Expiration Date:   01/21/2018  . Comprehensive metabolic panel    Standing Status:   Future    Number of Occurrences:   1    Standing Expiration Date:   01/21/2018   All questions were answered. The patient knows to call the clinic with any problems, questions or concerns.      Cammie Sickle, MD 01/21/2017 4:57 PM

## 2017-01-21 NOTE — Assessment & Plan Note (Addendum)
#   STAGE IV RECURRENT breast cancer/ TRIPLE NEGATIVE- poor response to [place] Taxol chemotherapy [last chemo on 8/09]]; currently status post palliative mastectomy/flap.   # In the interim- unfortunately patient is noticed to have progressive cutaneous disease. Discussed with the patient/husband my concerns the need to start systemic chemotherapy as soon as possible.  # I would recommend starting Eribulin D1 day 8 every 21 days. Discussed the potential side effects including but not limited to neutropenia tingling and numbness.   # I would recommend a PET scan-with a progressive disease as a recent August 2018 CT scan did not show any significant visceral disease. We will get labs today.  # Discussed with Dr.Dillingham- who recommends waiting at least 2 more weeks to help heal the incision.   # patient will follow up with me in 1 week; chemo [tentative].

## 2017-01-22 NOTE — Telephone Encounter (Signed)
Noted  

## 2017-01-23 ENCOUNTER — Telehealth: Payer: Self-pay | Admitting: *Deleted

## 2017-01-23 ENCOUNTER — Ambulatory Visit: Payer: BLUE CROSS/BLUE SHIELD | Attending: Internal Medicine | Admitting: Occupational Therapy

## 2017-01-23 DIAGNOSIS — I890 Lymphedema, not elsewhere classified: Secondary | ICD-10-CM | POA: Insufficient documentation

## 2017-01-23 DIAGNOSIS — M6281 Muscle weakness (generalized): Secondary | ICD-10-CM | POA: Insufficient documentation

## 2017-01-23 DIAGNOSIS — L905 Scar conditions and fibrosis of skin: Secondary | ICD-10-CM | POA: Insufficient documentation

## 2017-01-23 DIAGNOSIS — M25611 Stiffness of right shoulder, not elsewhere classified: Secondary | ICD-10-CM | POA: Diagnosis present

## 2017-01-23 DIAGNOSIS — M25511 Pain in right shoulder: Secondary | ICD-10-CM | POA: Diagnosis present

## 2017-01-23 NOTE — Telephone Encounter (Signed)
Patient called her insurance and opened an appeal for her pet scan. She will personally contact me in the am to determine the next steps in her care for Dr. Rogue Bussing to appeal her claim.

## 2017-01-24 ENCOUNTER — Other Ambulatory Visit: Payer: Self-pay | Admitting: *Deleted

## 2017-01-24 DIAGNOSIS — Z171 Estrogen receptor negative status [ER-]: Principal | ICD-10-CM

## 2017-01-24 DIAGNOSIS — C50511 Malignant neoplasm of lower-outer quadrant of right female breast: Secondary | ICD-10-CM

## 2017-01-24 NOTE — Patient Instructions (Signed)
-  AAROM for shoulder flexion , abd on wall  pect stretch in corner on wall  Upper trap and scalenes stretch on R and L  In chair Scapula retraction AROM  scaption against wall with scapula on wall up to 90 degrees  10 reps  2 x day  Can do golf club over head in supine - flexion , horizontal ABD and ADD - ext rotation   Compression sleeve on during day on R arm - with gauntlet

## 2017-01-24 NOTE — Telephone Encounter (Signed)
Pt returned phone call - she states that her insurance Approved the pet scan.

## 2017-01-24 NOTE — Therapy (Signed)
[facility] [address], [place], [address] Phone: [phone]   Fax:  [phone]  Occupational Therapy Evaluation  Patient Details  Name: Kiara Grant MRN: [id number] Date of Birth: 1971-01-07 Referring Provider: Dr Rogue Bussing   Encounter Date: 01/23/2017      OT End of Session - 01/24/17 1440    Visit Number 1   Number of Visits 12   Date for OT Re-Evaluation 03/07/17   OT Start Time 1001   OT Stop Time 1100   OT Time Calculation (min) 59 min   Activity Tolerance Patient tolerated treatment well   Behavior During Therapy [facility] for tasks assessed/performed      Past Medical History:  Diagnosis Date  . Anemia   . BRCA negative 03/2014   Testing completed at [facility]  . Breast cancer ([place]) 03/2014   Right, T1b, N1 prYpT1b,N1a, ER neg, PR < 10 %, Her 2 neu not overexpressed.   . Cancer [facility]) 03-24-14   Right breast, microcalcifications. ER negative, PR less than 10%, HER-2/neu not amplified.  . Melanoma [facility]) Sept 2011   Left neck T1a, 0.35 mm. Treated by Mohs injury [facility].  . Personal history of chemotherapy   . PONV (postoperative nausea and vomiting)    WITH 1ST PORT PLACEMENT ONLY    Past Surgical History:  Procedure Laterality Date  . AXILLARY LYMPH NODE BIOPSY Left 12/25/2016   Procedure: AXILLARY LYMPH NODE BIOPSY;  Surgeon: Robert Bellow, MD;  Location: [facility] ORS;  Service: General;  Laterality: Left;  . BREAST BIOPSY Right 03-24-14   +  . BREAST EXCISIONAL BIOPSY Right 10/28/2014   Lumpectomy +  . BREAST LUMPECTOMY WITH SENTINEL LYMPH NODE BIOPSY Right 10/28/2014   Procedure: right breast wide excision with sentinel node biopsy, mastoplasty, axillary dissection ;  Surgeon: Robert Bellow, MD;  Location: [facility] ORS;  Service: General;  Laterality: Right;  . BREAST RECONSTRUCTION Right 12/25/2016   Procedure: BREAST RECONSTRUCTION;  Surgeon: Wallace Going, DO;   Location: [facility] ORS;  Service: Plastics;  Laterality: Right;  . INCISIONAL BREAST BIOPSY Right 09/03/2016   Punch biopsy, invasive mammary carcinoma, Triple negative.   Marland Kitchen LATISSIMUS FLAP TO BREAST Right 12/25/2016   Procedure: LATISSIMUS FLAP TO BREAST;  Surgeon: Wallace Going, DO;  Location: [facility] ORS;  Service: Plastics;  Laterality: Right;  Right Latissimus myocutaneous flap with rotational flap from abdomen  . MASTECTOMY WITH AXILLARY LYMPH NODE DISSECTION Right 12/25/2016   Procedure: MASTECTOMY WITH AXILLARY LYMPH NODE DISSECTION;  Surgeon: Robert Bellow, MD;  Location: [facility] ORS;  Service: General;  Laterality: Right;  . MELANOMA EXCISION  Nov 2011   neck  . PORT-A-CATH REMOVAL  2017  . [facility] PLACEMENT  Dec 2015  . PORTACATH PLACEMENT N/A 09/24/2016   Procedure: INSERTION PORT-A-CATH;  Surgeon: Robert Bellow, MD;  Location: [facility] ORS;  Service: General;  Laterality: N/A;  . SIMPLE MASTECTOMY WITH AXILLARY SENTINEL NODE BIOPSY Right 12/25/2016   Procedure: SIMPLE MASTECTOMY;  Surgeon: Robert Bellow, MD;  Location: [facility] ORS;  Service: General;  Laterality: Right;    There were no vitals filed for this visit.      Subjective Assessment - 01/23/17 2121    Subjective  I did not see you for about 3 months - I was little busy - had mastectomy and reconstruction on my R breast - and lumpectomy on L - did wear my compression sleeve on arm , doing  okay from surgery - it was  about 4 wks ago - but now I cannot lift my arm - shoulder stiff - the DR wants me to have therapy for shoulder  ROM  - going back to work next week 1/2 day    Pertinent History  She was diagnosed with right breast cancer, triple negative in 2015. BRCA negative. She underwent a lumpectomy 03/2014 with chemo and radiation with 12 nodes removed on the right. She had an excisional biopsy 10/2014 of the right breast and a skin biopsy 08/2016 all of which was positive for breast cancer. She also had resection of a  melanoma on her anterior neck. She has two teenage sons- had on 9/5 R  mastectomy and reconstruction latissimus flap to breast    Patient Stated Goals I just want to keep the swelling under control in my L arm  and get my motion better in shoulder    Currently in Pain? Yes   Pain Score 2    Pain Location Shoulder   Pain Orientation Right   Pain Descriptors / Indicators Aching   Pain Type Surgical pain             LYMPHEDEMA/ONCOLOGY QUESTIONNAIRE - 01/23/17 0914      Right Upper Extremity Lymphedema   15 cm Proximal to Olecranon Process 28.7 cm   10 cm Proximal to Olecranon Process 27.8 cm   Olecranon Process 24.5 cm   15 cm Proximal to Ulnar Styloid Process 24 cm   10 cm Proximal to Ulnar Styloid Process 21.3 cm   Just Proximal to Ulnar Styloid Process 15.2 cm   Across Hand at [employer] 18.5 cm   At [place] of 2nd Digit 5.9 cm   At [facility] of Thumb 6 cm     assess her over the counter compression sleeve and gauntlet - pt wearing it about 6 hrs a day per pt - fit well  Measurements about the same compare to 3 months ago - pt to cont wearing - will assess if need night time compression  Holding off on jovipak breast pad secondary to reconstruction month ago on R breast and chest  Scar healing - Assess shoulder AROM R and L  See clinical impression HEP provided and review with pt  AAROM shoulder flexion and ABd on wall  External rotation stretch on wall in corner  Retraction AROM of scapula Scaption to 90 degrees with scapula on wall -  Stretch for L and R upper traps , scalenes -   Can also do some AAROM and AROM supine with golf club over head for flexion , horizontal ABD and ADD , ext rotation  Graston tool nr 2 and 4 for sweeping over R and L upper traps - [place] at 20%, 3.3MHZ, .8 intensity  For 4 min  Static at end over upper traps - 2 trigger points on R                    OT Education - 01/23/17 2121    Education provided Yes   Person(s) Educated  Patient;Other (comment)  friend   Methods Explanation;Demonstration;Tactile cues;Verbal cues;Handout   Comprehension Verbal cues required;Returned demonstration;Verbalized understanding          OT Short Term Goals - 01/23/17 1455      OT SHORT TERM GOAL #1   Title Pt to be ind in HEP to decrease and keep lymphedema in R upper quadrant  under control and increase AROM  in R shoulder    Baseline no knowledge on ROM - wearing compression sleeve and gauntlet but still increase more than 2 cm compare to L    Time 3   Period Weeks   Status New   Target Date 02/14/17           OT Long Term Goals - 01/24/17 1450      OT LONG TERM GOAL #1   Title Pt to be independent in wearing correct compression garments to decrease or keep R UE lymphedema and upper quadrant under control    Baseline wrist increase by 1 cm , forearm to elbow increase by 2 cm and upper arm 3.3 mc  - stll increase about the same with use of daytime compression  - could not do jovipak with reconstruction    Time 4   Period Weeks   Status On-going   Target Date 02/21/17     OT LONG TERM GOAL #2   Title R Shoulder AROM improve to [facility] to pull shirt over head and fix hair without increase symptoms    Baseline R shoulder flexion 105, ABD 70 and ext 45    Time 4   Period Weeks   Status New   Target Date 02/21/17     OT LONG TERM GOAL #3   Title Pt report decrease pain in R shoulder and  upper traps to less than 2/10 and increase ROM in cervical and shoulder to work more than 4 hrs without increase symptoms    Baseline pain with shoulder AROM - at the best 2/10 - did not return to work y et    Time 6   Period Weeks   Status New   Target Date 03/07/17     OT LONG TERM GOAL #4   Title Assess function on quickdash    Baseline not done yet    Time 1   Period Weeks   Status New   Target Date 01/31/17               Plan - 01/23/17 1442    Clinical Impression Statement Pt was seen 3 months ago for R upper  quadrant lymphedema - was recommend a compression sleeve and gauntlet - and jovipak breast pad - she now return after having R mastectomy  and recontruction with latissimus flap on 12/25/16 - healing great - still one area on medial R breast that has dressing - pt present with decrease  AROM and PROM in R shoulder , upper traps tight and spasm - decrease cerival rotation and lat flexion , and pain  - R shoulder flexion 105, ABD 70 ext 45 degrees , cervical flexion to L 25, R 30 ,  - pt's R UE  cont to be increase in circumference  compare to L arm by  2.8 cm proximal  to 1 cm at wrist    Occupational performance deficits (Please refer to evaluation for details): ADL's;IADL's;Work;Play;Leisure;Rest and Sleep   Rehab Potential Good   OT Frequency 2x / week   OT Duration 6 weeks   OT Treatment/Interventions Self-care/ADL training;Manual lymph drainage;Compression bandaging;Therapeutic exercises;Ultrasound;Scar mobilization;Passive range of motion;Manual Therapy   Plan assess progress with HEP and update as needed    Clinical Decision Making Limited treatment options, no task modification necessary   OT Home Exercise Plan see pt instructions    Consulted and Agree with Plan of Care Patient  Friend      Patient will benefit from skilled therapeutic intervention  in order to improve the following deficits and impairments:  Increased edema, Impaired UE functional use, Decreased range of motion, Impaired flexibility, Decreased scar mobility, Decreased strength, Pain  Visit Diagnosis: Lymphedema, not elsewhere classified - Plan: Ot plan of care cert/re-cert  Stiffness of right shoulder, not elsewhere classified - Plan: Ot plan of care cert/re-cert  Acute pain of right shoulder - Plan: Ot plan of care cert/re-cert  Muscle weakness (generalized) - Plan: Ot plan of care cert/re-cert  Scar condition and fibrosis of skin - Plan: Ot plan of care cert/re-cert    Problem List Patient Active Problem List    Diagnosis Date Noted  . Breast cancer ([place]) 12/25/2016  . Encounter for antineoplastic chemotherapy 10/03/2016  . Persistent headaches 09/13/2016  . Counseling regarding goals of care 09/13/2016  . Carcinoma of lower-outer quadrant of right breast in female, estrogen receptor negative ([place]) 01/30/2015  . Postoperative nausea 09/30/2014    Rosalyn Gess OTR/L,CLT 01/24/2017, 3:12 PM  [facility] [address] [place], [address] Phone: [phone]   Fax:  [phone]  Name: Kiara Grant MRN: [id number] Date of Birth: [birth year]

## 2017-01-24 NOTE — Progress Notes (Signed)
Orders entered for urine pregnancy test prior to pet scan on Monday- per radiology protocol.

## 2017-01-27 ENCOUNTER — Telehealth: Payer: Self-pay | Admitting: *Deleted

## 2017-01-27 ENCOUNTER — Encounter
Admission: RE | Admit: 2017-01-27 | Discharge: 2017-01-27 | Disposition: A | Discharge status: Home / Self Care | Payer: BLUE CROSS/BLUE SHIELD | Source: Ambulatory Visit | Attending: Internal Medicine | Admitting: Internal Medicine

## 2017-01-27 ENCOUNTER — Other Ambulatory Visit
Admission: RE | Admit: 2017-01-27 | Discharge: 2017-01-27 | Disposition: A | Discharge status: Home / Self Care | Payer: BLUE CROSS/BLUE SHIELD | Source: Ambulatory Visit | Attending: Internal Medicine | Admitting: Internal Medicine

## 2017-01-27 DIAGNOSIS — Z171 Estrogen receptor negative status [ER-]: Secondary | ICD-10-CM | POA: Diagnosis present

## 2017-01-27 DIAGNOSIS — C50511 Malignant neoplasm of lower-outer quadrant of right female breast: Secondary | ICD-10-CM | POA: Diagnosis present

## 2017-01-27 LAB — GLUCOSE, CAPILLARY: GLUCOSE-CAPILLARY: 80 mg/dL (ref 65–99)

## 2017-01-27 LAB — PREGNANCY, URINE: Preg Test, Ur: NEGATIVE

## 2017-01-27 MED ORDER — FLUDEOXYGLUCOSE F - 18 (FDG) INJECTION
12.7400 | Freq: Once | INTRAVENOUS | Status: AC | PRN
  Administered 2017-01-27: 12.74 via INTRAVENOUS

## 2017-01-27 NOTE — Telephone Encounter (Signed)
Called PET report:     She has appointment tomorrow for FU IMPRESSION: 1. Unfortunately there is evidence of breast cancer progression. 2. New focal metabolic activity within the LEFT breast tissue. 3. New hypermetabolic LEFT and RIGHT small level IV neck lymph nodes consistent metastatic disease. 4. Mild increase in hypermetabolic mediastinal lymph nodes. 5. Stable bilateral hypermetabolic axial lymph nodes. 6. Small upper lobe pulmonary nodules are too small to characterize by FDG PET imaging. Recommend attention on follow-up. 7. Nodular Pleural thickening in the RIGHT upper lobe is concerning residual disease. 8. Small focus of metabolic activity anterior to the RIGHT hepatic lobe is favored a metastatic lymph node. 9. Long segment of metabolic activity associated the terminal ileum is favored benign 10. No evidence skeletal metastasis. These results will be called to the ordering clinician or representative by the Radiologist Assistant, and communication documented in the PACS or zVision Dashboard.   Electronically Signed   By: Suzy Bouchard M.D.   On: 01/27/2017 09:58

## 2017-01-27 NOTE — Telephone Encounter (Signed)
Pt has an apt tom. In clinic to discuss results

## 2017-01-28 ENCOUNTER — Inpatient Hospital Stay (HOSPITAL_BASED_OUTPATIENT_CLINIC_OR_DEPARTMENT_OTHER): Payer: BLUE CROSS/BLUE SHIELD | Admitting: Internal Medicine

## 2017-01-28 ENCOUNTER — Ambulatory Visit: Payer: BLUE CROSS/BLUE SHIELD | Admitting: General Surgery

## 2017-01-28 ENCOUNTER — Ambulatory Visit: Payer: BLUE CROSS/BLUE SHIELD | Admitting: Occupational Therapy

## 2017-01-28 ENCOUNTER — Telehealth: Payer: Self-pay | Admitting: Internal Medicine

## 2017-01-28 ENCOUNTER — Inpatient Hospital Stay: Payer: BLUE CROSS/BLUE SHIELD

## 2017-01-28 VITALS — BP 104/67 | HR 82 | Resp 18

## 2017-01-28 VITALS — BP 121/79 | HR 81 | Temp 98.1°F | Resp 16 | Wt 119.5 lb

## 2017-01-28 DIAGNOSIS — C770 Secondary and unspecified malignant neoplasm of lymph nodes of head, face and neck: Secondary | ICD-10-CM

## 2017-01-28 DIAGNOSIS — Z79899 Other long term (current) drug therapy: Secondary | ICD-10-CM | POA: Diagnosis not present

## 2017-01-28 DIAGNOSIS — T451X5A Adverse effect of antineoplastic and immunosuppressive drugs, initial encounter: Principal | ICD-10-CM

## 2017-01-28 DIAGNOSIS — D701 Agranulocytosis secondary to cancer chemotherapy: Secondary | ICD-10-CM

## 2017-01-28 DIAGNOSIS — C50511 Malignant neoplasm of lower-outer quadrant of right female breast: Secondary | ICD-10-CM

## 2017-01-28 DIAGNOSIS — Z9011 Acquired absence of right breast and nipple: Secondary | ICD-10-CM

## 2017-01-28 DIAGNOSIS — Z171 Estrogen receptor negative status [ER-]: Secondary | ICD-10-CM

## 2017-01-28 DIAGNOSIS — Z5181 Encounter for therapeutic drug level monitoring: Secondary | ICD-10-CM

## 2017-01-28 MED ORDER — SODIUM CHLORIDE 0.9% FLUSH
10.0000 mL | INTRAVENOUS | Status: DC | PRN
  Administered 2017-01-28: 10 mL
  Filled 2017-01-28: qty 10

## 2017-01-28 MED ORDER — SODIUM CHLORIDE 0.9 % IV SOLN
Freq: Once | INTRAVENOUS | Status: AC
  Administered 2017-01-28: 11:00:00 via INTRAVENOUS
  Filled 2017-01-28: qty 1000

## 2017-01-28 MED ORDER — PROCHLORPERAZINE MALEATE 10 MG PO TABS
10.0000 mg | ORAL_TABLET | Freq: Once | ORAL | Status: AC
  Administered 2017-01-28: 10 mg via ORAL
  Filled 2017-01-28: qty 1

## 2017-01-28 MED ORDER — SODIUM CHLORIDE 0.9 % IV SOLN
1.4000 mg/m2 | Freq: Once | INTRAVENOUS | Status: AC
  Administered 2017-01-28: 2.15 mg via INTRAVENOUS
  Filled 2017-01-28: qty 4.3

## 2017-01-28 MED ORDER — HEPARIN SOD (PORK) LOCK FLUSH 100 UNIT/ML IV SOLN
500.0000 [IU] | Freq: Once | INTRAVENOUS | Status: AC | PRN
  Administered 2017-01-28: 500 [IU]
  Filled 2017-01-28: qty 5

## 2017-01-28 NOTE — Progress Notes (Signed)
[name], [name], MD (General Surgery) Copland, Ginette Otto as Referring Physician (Physician Assistant) [name], MD (Oncology)  Cancer Staging No matching staging information was found for the patient.   Oncology History   # DEC 2015-  Carcinoma of right breast T1 be N1 M0 tumor stage II diagnosis in December of 2015 (right lower and outer quadrant). Needle biopsy positive of the right, Axillary lymph node (December, 2015), positive for metastatic breast carcinoma. Estrogen receptor negative.  Progesterone receptor weakly positive.  HER-2/neu receptor negative. # JAN 2016- NEO-ADJ CHEMO- Cytoxan, Adriamycin,  followed by Taxol (neoadjuvant therapy) April 25, 2014., 3.finished 4 cycles of chemotherapy with Cytoxan and Adriamycin on 7 th  March, 2016 4.started Taxol on a weekly basis from March 29 5.  Patient will finish weekly Taxol on 13th of June, 2016 6.  Patient had lumpectomy and axillary node evaluation. ypT1b [20m]  ypN1 c (3 /12LN)MO [Dr.Byrnett].  (July, 2016) s/p RT.   # MAY 2018- RECURRENT/ IPSILATERAL BREAST- may 2018- PET- ipsilateral & contralateral axillary/supraclavicular; right breast thickening.   [name]; poor response; [name]; [name]; [name]  # 01/28/2017- ERIBULIN    -------------------------------------------------------------------------- # [facility]- No targets**  7.  Genetic mutation.  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  female  the above history of recurrent/ metastatic ER negative/HER-2/neu  negative- currently s/p Right palliative mastectomy; reconstruction/flap [sep 5th]. Patient had poor response to preoperative carbotaxol chemotherapy. Patient is here to review the results of her PET scan/ and also proceed with Eribulin.  Unfortunately patient continues to have worsening rash superiorly; inferiorly and laterally.  She also notes to have noted swelling in the neck.  She was evaluated by plastic surgery this morning. Otherwise denies any headaches. Denies any nausea vomiting.  Denies any significant tingling and numbness. Denies any nausea vomiting. Denies any bone pain. No fever no chills.   REVIEW OF SYSTEMS:  A complete 10 point review of system is done which is negative except mentioned above/history of present illness.   PAST MEDICAL HISTORY :  Past Medical History:  Diagnosis Date  . Anemia   . BRCA negative 03/2014   [facility]  . [place]   Right, T1b, N1 prYpT1b,N1a, ER neg, PR < 10 %, Her 2 neu not overexpressed.   . Cancer ([facility] 03-24-14   Right breast, microcalcifications. ER negative, PR less than 10%, HER-2/neu not amplified.  . [facility]anoma ([facility] Sept 2011   Left neck T1a, 0.35 mm[facility]  . Personal history of chemotherapy   . PONV (postoperative nausea and vomiting)    WITH 1ST PORT PLACEMENT ONLY    PAST SURGICAL HISTORY :   Past Surgical History:  Procedure Laterality Date  . AXILLARY LYMPH NODE BIOPSY Left 12/25/2016   Procedure: AXILLARY LYMPH NODE BIOPSY;  Surgeon: BRobert Bellow MD;  Location: [facility] ORS;  Service: General;  Laterality: Left;  . BREAST BIOPSY Right 03-24-14   +  . BREAST EXCISIONAL BIOPSY Right 10/28/2014   Lumpectomy +  .  BREAST LUMPECTOMY WITH SENTINEL LYMPH NODE BIOPSY Right 10/28/2014   Procedure: right breast wide excision with sentinel node biopsy, mastoplasty, axillary dissection ;  Surgeon: Robert Bellow, MD;  Location: [facility] ORS;  Service: General;   Laterality: Right;  . BREAST RECONSTRUCTION Right 12/25/2016   Procedure: BREAST RECONSTRUCTION;  Surgeon: Wallace Going, DO;  Location: [facility] ORS;  Service: Plastics;  Laterality: Right;  . INCISIONAL BREAST BIOPSY Right 09/03/2016   Punch biopsy, invasive mammary carcinoma, Triple negative.   Marland Kitchen LATISSIMUS FLAP TO BREAST Right 12/25/2016   Procedure: LATISSIMUS FLAP TO BREAST;  Surgeon: Wallace Going, DO;  Location: [facility] ORS;  Service: Plastics;  Laterality: Right;  Right Latissimus myocutaneous flap with rotational flap from abdomen  . MASTECTOMY WITH AXILLARY LYMPH NODE DISSECTION Right 12/25/2016   Procedure: MASTECTOMY WITH AXILLARY LYMPH NODE DISSECTION;  Surgeon: Robert Bellow, MD;  Location: [facility] ORS;  Service: General;  Laterality: Right;  . MELANOMA EXCISION  Nov 2011   neck  . PORT-A-CATH REMOVAL  2017  . [facility] PLACEMENT  Dec 2015  . PORTACATH PLACEMENT N/A 09/24/2016   Procedure: INSERTION PORT-A-CATH;  Surgeon: Robert Bellow, MD;  Location: [facility] ORS;  Service: General;  Laterality: N/A;  . SIMPLE MASTECTOMY WITH AXILLARY SENTINEL NODE BIOPSY Right 12/25/2016   Procedure: SIMPLE MASTECTOMY;  Surgeon: Robert Bellow, MD;  Location: [facility] ORS;  Service: General;  Laterality: Right;    FAMILY HISTORY :   Family History  Problem Relation Age of Onset  . Cancer Mother        lung ? mid 95    SOCIAL HISTORY:   Social History  Substance Use Topics  . Smoking status: Never Smoker  . Smokeless tobacco: Never Used  . Alcohol use No    ALLERGIES:  is allergic to hydrocodone and sulfa antibiotics.  MEDICATIONS:  Current Outpatient Prescriptions  Medication Sig Dispense Refill  . diazepam (VALIUM) 2 MG tablet Take 2 mg by mouth as needed.  0  . loratadine (CLARITIN) 10 MG tablet Take 10 mg by mouth daily as needed (after onpro injections).    . Misc Natural Products (DANDELION ROOT PO) Take 1 tablet by mouth 2 (two) times daily.     . ondansetron (ZOFRAN)  8 MG tablet Take 8 mg by mouth every 8 (eight) hours as needed for nausea or vomiting.    Marland Kitchen oxyCODONE (OXY IR/ROXICODONE) 5 MG immediate release tablet Take 1 tablet (5 mg total) by mouth every 4 (four) hours as needed for moderate pain. 30 tablet 0  . Turmeric 500 MG CAPS Take 500 mg by mouth 2 (two) times daily.     No current facility-administered medications for this visit.    Facility-Administered Medications Ordered in Other Visits  Medication Dose Route Frequency Provider Last Rate Last Dose  . heparin lock flush 100 unit/mL  500 Units Intracatheter Once PRN Kiara Dalton R, MD      . sodium chloride 0.9 % injection 10 mL  10 mL Intravenous PRN Kiara Kanner, NP   10 mL at 11/08/14 1207  . sodium chloride flush (NS) 0.9 % injection 10 mL  10 mL Intracatheter PRN Kiara Sickle, MD   10 mL at 01/28/17 1054    PHYSICAL EXAMINATION: ECOG PERFORMANCE STATUS: 0 - Asymptomatic  BP 121/79 (BP Location: Left Arm, Patient Position: Sitting)   Pulse 81   Temp 98.1 F (36.7 C) (Tympanic)   Resp 16   Wt 119 lb 7.8 oz (  54.2 kg)   BMI 20.51 kg/m   Filed Weights   01/28/17 0953  Weight: 119 lb 7.8 oz (54.2 kg)    GENERAL: Well-nourished well-developed; Alert, no distress and comfortable.   With her husband.  EYES: no pallor or icterus OROPHARYNX: no thrush or ulceration; good dentition  NECK: supple, no masses felt LYMPH:  no palpable lymphadenopathy in the cervical Or inguinal region. Positive for a centimeter sized lymph node in the left axilla. None on the right. LUNGS: clear to auscultation and  No wheeze or crackles HEART/CVS: regular rate & rhythm and no murmurs; No lower extremity edema ABDOMEN:abdomen soft, non-tender and normal bowel sounds Musculoskeletal:no cyanosis of digits and no clubbing  PSYCH: alert & oriented x 3 with fluent speech NEURO: no focal motor/sensory deficits SKIN:  Mastectomy and flap noted. Multiple erythematous/papules noted superior  margin [above the clavicle]; inferior margin.   LABORATORY DATA:  I have reviewed the data as listed    Component Value Date/Time   NA 138 01/21/2017 1048   NA 138 08/16/2014 1332   K 4.5 01/21/2017 1048   K 3.7 08/16/2014 1332   CL 99 (L) 01/21/2017 1048   CL 106 08/16/2014 1332   CO2 30 01/21/2017 1048   CO2 27 08/16/2014 1332   GLUCOSE 100 (H) 01/21/2017 1048   GLUCOSE 118 (H) 08/16/2014 1332   BUN 12 01/21/2017 1048   BUN 9 08/16/2014 1332   CREATININE 0.79 01/21/2017 1048   CREATININE 0.71 08/16/2014 1332   CALCIUM 9.4 01/21/2017 1048   CALCIUM 9.0 08/16/2014 1332   PROT 8.0 01/21/2017 1048   PROT 7.2 08/16/2014 1332   ALBUMIN 4.3 01/21/2017 1048   ALBUMIN 4.4 08/16/2014 1332   AST 21 01/21/2017 1048   AST 28 08/16/2014 1332   ALT 11 (L) 01/21/2017 1048   ALT 31 08/16/2014 1332   ALKPHOS 59 01/21/2017 1048   ALKPHOS 45 08/16/2014 1332   BILITOT 0.5 01/21/2017 1048   BILITOT 0.4 08/16/2014 1332   GFRNONAA >60 01/21/2017 1048   GFRNONAA >60 08/16/2014 1332   GFRAA >60 01/21/2017 1048   GFRAA >60 08/16/2014 1332    No results found for: SPEP, UPEP  Lab Results  Component Value Date   WBC 4.8 01/21/2017   NEUTROABS 3.2 01/21/2017   HGB 12.9 01/21/2017   HCT 37.7 01/21/2017   MCV 95.5 01/21/2017   PLT 175 01/21/2017      Chemistry      Component Value Date/Time   NA 138 01/21/2017 1048   NA 138 08/16/2014 1332   K 4.5 01/21/2017 1048   K 3.7 08/16/2014 1332   CL 99 (L) 01/21/2017 1048   CL 106 08/16/2014 1332   CO2 30 01/21/2017 1048   CO2 27 08/16/2014 1332   BUN 12 01/21/2017 1048   BUN 9 08/16/2014 1332   CREATININE 0.79 01/21/2017 1048   CREATININE 0.71 08/16/2014 1332   01-28-2017    Component Value Date/Time   CALCIUM 9.4 01/21/2017 1048   CALCIUM 9.0 08/16/2014 1332   ALKPHOS 59 01/21/2017 1048   ALKPHOS 45 08/16/2014 1332   AST 21 01/21/2017 1048   AST 28 08/16/2014 1332   ALT 11 (L) 01/21/2017 1048   ALT 31 08/16/2014 1332    BILITOT 0.5 01/21/2017 1048   BILITOT 0.4 08/16/2014 1332           IMPRESSION: Chest Impression:  1. Small axillary lymph nodes hypermetabolic on comparison PET-CT scan are not significantly changed. One lymph  node in LEFT axilla does measure slightly larger. 2. No mediastinal lymphadenopathy.  No suspicious pulmonary nodules. 3. Mild skin thickening in the RIGHT breast similar. 4. Consolidation in the RIGHT lung apex consistent with radiation change.  Abdomen / Pelvis Impression:  1. No evidence of metastatic disease in the abdomen pelvis. 2. No evidence skeletal metastatic disease.   Electronically Signed   By: Suzy Bouchard M.D.   On: 12/11/2016 14:30  --------------------------------------------------------------    IMPRESSION: 1. Unfortunately there is evidence of breast cancer progression. 2. New focal metabolic activity within the LEFT breast tissue. 3. New hypermetabolic LEFT and RIGHT small level IV neck lymph nodes consistent metastatic disease. 4. Mild increase in hypermetabolic mediastinal lymph nodes. 5. Stable bilateral hypermetabolic axial lymph nodes. 6. Small upper lobe pulmonary nodules are too small to characterize by FDG PET imaging. Recommend attention on follow-up. 7. Nodular Pleural thickening in the RIGHT upper lobe is concerning residual disease. 8. Small focus of metabolic activity anterior to the RIGHT hepatic lobe is favored a metastatic lymph node. 9. Long segment of metabolic activity associated the terminal ileum is favored benign 10. No evidence skeletal metastasis. These results will be called to the ordering clinician or representative by the Radiologist Assistant, and communication documented in the PACS or zVision Dashboard.   Electronically Signed   By: Suzy Bouchard M.D.   On: 01/27/2017 09:58  RADIOGRAPHIC STUDIES: I have personally reviewed the radiological images as listed and agreed with the findings in  the report. Nm Pet Image Restag (ps) Skull Base To Thigh  Result Date: 01/27/2017 CLINICAL DATA:  SUBSEQUENT treatment strategy for recurrent RIGHT BREAST CARCINOMA. EXAM: NUCLEAR MEDICINE PET SKULL BASE TO THIGH TECHNIQUE: 12.7 mCi F-18 FDG was injected intravenously. Full-ring PET imaging was performed from the skull base to thigh after the radiotracer. CT data was obtained and used for attenuation correction and anatomic localization. FASTING BLOOD GLUCOSE:  Value: 80 mg/dl COMPARISON:  CT 12/11/2016, PET-CT 09/12/2016 FINDINGS: NECK New bilateral level IV hypermetabolic lymph nodes on the LEFT and RIGHT. Example node on the RIGHT with SUV max equal 5.4. These nodes are small measuring approximately 5 mm short axis. CHEST New hypermetabolic mass within the LEFT breast tissue. This metabolic focus is well-defined FDG PET imaging but not seen on CT imaging. Activity is high with SUV max equal 7.8. Again demonstrated bilateral hypermetabolic axillary lymph nodes. There isincreased in hypermetabolic anterior mediastinal lymph nodes with SUV max equal 5.3 in a high LEFT paratracheal lymph node. Hypermetabolic LEFT supraclavicular node similar comparison exam. There several small pulmonary nodules not clearly seen on comparison CT. For example 3 mm nodule along the RIGHT oblique fissure in the RIGHT upper lobe measuring 73 image 73, series 3. Small LEFT upper lobe nodule measuring 4 mm (image 64, series 3). Nodular thickening within the postradiation radiation change in the anterior RIGHT upper lobe measuring 8 mm (image 64, series 3). This is slightly more prominent in size and has metabolic activity SUV max of 3.0. This is adjacent to a sub pectoralis hypermetabolic lymph node. ABDOMEN/PELVIS Small focus of activity along the anterior margin of the liver. Burtis Junes this representing a hypermetabolic lymph node measuring 10 mm on image 127, series 3 with SUV max equal 3.1. No clear activity within liver. Adrenal glands  are normal. No hypermetabolic abdominopelvic lymph nodes. There is intense activity associated with the terminal ileum without mass lesion identified. Uterus and ovaries are normal. SKELETON No evidence skeletal metastasis. IMPRESSION: 1. Unfortunately there  is evidence of breast cancer progression. 2. New focal metabolic activity within the LEFT breast tissue. 3. New hypermetabolic LEFT and RIGHT small level IV neck lymph nodes consistent metastatic disease. 4. Mild increase in hypermetabolic mediastinal lymph nodes. 5. Stable bilateral hypermetabolic axial lymph nodes. 6. Small upper lobe pulmonary nodules are too small to characterize by FDG PET imaging. Recommend attention on follow-up. 7. Nodular Pleural thickening in the RIGHT upper lobe is concerning residual disease. 8. Small focus of metabolic activity anterior to the RIGHT hepatic lobe is favored a metastatic lymph node. 9. Long segment of metabolic activity associated the terminal ileum is favored benign 10. No evidence skeletal metastasis. These results will be called to the ordering clinician or representative by the Radiologist Assistant, and communication documented in the PACS or zVision Dashboard. Electronically Signed   By: Suzy Bouchard M.D.   On: 01/27/2017 09:58     ASSESSMENT & PLAN:  Carcinoma of lower-outer quadrant of right breast in female, estrogen receptor negative ([place]) # STAGE IV RECURRENT breast cancer/ TRIPLE NEGATIVE- poor response to [place] Taxol chemotherapy [last chemo on 8/09]; currently status post palliative mastectomy/flap.   # In the interim- unfortunately patient is noticed to have progressive cutaneous disease; oct 8th PET scan- progressive disease- bilateral neck lymph nodes; mediastinal lymph nodes; left breast/axillary uptake; question focus anterior to liver.   # Discussed with the patient husband regarding starting chemotherapy today. They understand risk of possible infection especially after his recent  graft/surgery. Also discussed with Dr.Crystal- will plan to HOLD Rt given widespread progressive disease.   # I would recommend starting Eribulin D1 day 8 every 21 days. Discussed the potential side effects including but not limited to neutropenia tingling and numbness. Will also add neulasta with day-8 chemo.   #  Growth factor-Neulasta/On pro would be given as prophylaxis for chemotherapy-induced neutropenia to prevent febrile neutropenias. Discussed potential side effect- myalgias/arthralgias- recommend Claritin for 7 days.   # Will also order MUGA scan; for possible Doxil in future. Also recommend opinion with Dr. Hiram Comber at [facility].   # Discussed with Dr.Dillingham. Also left message for Dr.Byrnett.   # Weekly CBC; Eribulin- 1 week; follow-up with me in 3 weeks/CBC/cmp.    Orders Placed This Encounter  Procedures  . NM Cardiac [place] Rest    Standing Status:   Future    Standing Expiration Date:   01/28/2018    Order Specific Question:   caspofungin (CANCIDAS) frequency    Answer:   Q 24H    Order Specific Question:   Preferred imaging location?    Answer:   [facility]    Order Specific Question:   Radiology Contrast Protocol - do NOT remove file path    Answer:   [email or web address]    Order Specific Question:   Reason for Exam additional comments    Answer:   breast cancer; cardiotoxic chemo  . CBC with Differential/Platelet    Standing Status:   Future    Standing Expiration Date:   01/28/2018  . Comprehensive metabolic panel    Standing Status:   Future    Standing Expiration Date:   01/28/2018  . CBC with Differential/Platelet    Standing Status:   Future    Standing Expiration Date:   01/28/2018  . CBC with Differential/Platelet    Standing Status:   Future    Standing Expiration Date:   01/28/2018  . Comprehensive metabolic panel    Standing Status:   Future  Standing Expiration Date:   01/28/2018   All questions were answered. The patient  knows to call the clinic with any problems, questions or concerns.      Kiara Sickle, MD 01/28/2017 11:34 AM

## 2017-01-28 NOTE — Progress Notes (Signed)
Patient is here today for a follow up.

## 2017-01-28 NOTE — Telephone Encounter (Signed)
Robin, please schedule pt for Onpro

## 2017-01-28 NOTE — Telephone Encounter (Signed)
I added onpro for day 8 [starting- oct 16th].   Discussed with patient the potential side effect- myalgias/arthralgias with onpro- recommend Claritin for 7 days; start oct 16th.

## 2017-01-28 NOTE — Addendum Note (Signed)
Addended by: Sandria Bales B on: 01/28/2017 02:48 PM   Modules accepted: Orders

## 2017-01-28 NOTE — Patient Instructions (Signed)
Eribulin solution for injection  What is this medicine?  ERIBULIN (er e bu lin) is a chemotherapy drug. It is used to treat breast cancer and liposarcoma.  This medicine may be used for other purposes; ask your health care provider or pharmacist if you have questions.  COMMON BRAND NAME(S): [place]  What should I tell my health care provider before I take this medicine?  They need to know if you have any of these conditions:  -heart disease  -history of irregular heartbeat  -kidney disease  -liver disease  -low blood counts, like low white cell, platelet, or red cell counts  -low levels of potassium or magnesium in the blood  -an unusual or allergic reaction to eribulin, other medicines, foods, dyes, or preservatives  -pregnant or trying to get pregnant  -breast-feeding  How should I use this medicine?  This medicine is for infusion into a vein. It is given by a health care professional in a hospital or clinic setting.  Talk to your pediatrician regarding the use of this medicine in children. Special care may be needed.  Overdosage: If you think you have taken too much of this medicine contact a poison control center or emergency room at once.  NOTE: This medicine is only for you. Do not share this medicine with others.  What if I miss a dose?  It is important not to miss your dose. Call your doctor or health care professional if you are unable to keep an appointment.  What may interact with this medicine?  Do not take this medicine with any of the following medications:  -amiodarone  -astemizole  -arsenic trioxide  -bepridil  -bretylium  -chloroquine  -chlorpromazine  -cisapride  -clarithromycin  -dextromethorphan,  quinidine  -disopyramide  -dofetilide  -droperidol  -dronedarone  -erythromycin  -grepafloxacin  -halofantrine  -haloperidol  -ibutilide  -levomethadyl  -mesoridazine  -methadone  -pentamidine  -procainamide  -quinidine  -pimozide  -posaconazole  -probucol  -propafenone  -saquinavir  -sotalol  -sparfloxacin  -terfenadine  -thioridazine  -troleandomycin  -ziprasidone  This list may not describe all possible interactions. Give your health care provider a list of all the medicines, herbs, non-prescription drugs, or dietary supplements you use. Also tell them if you smoke, drink alcohol, or use illegal drugs. Some items may interact with your medicine.  What should I watch for while using this medicine?  This drug may make you feel generally unwell. This is not uncommon, as chemotherapy can affect healthy cells as well as cancer cells. Report any side effects. Continue your course of treatment even though you feel ill unless your doctor tells you to stop.  Call your doctor or health care professional for advice if you get a fever, chills or sore throat, or other symptoms of a cold or flu. Do not treat yourself. This drug decreases your body's ability to fight infections. Try to avoid being around people who are sick.  This medicine may increase your risk to bruise or bleed. Call your doctor or health care professional if you notice any unusual bleeding.  You may need blood work done while you are taking this medicine.  Do not become pregnant while taking this medicine or for 2 weeks after stopping it. Women should inform their doctor if they wish to become pregnant or think they might be pregnant. Men should not father a child while taking this medicine and for 3.5 months after stopping it. There is a potential for serious side effects to an unborn child.   Talk to your health care professional or pharmacist for more information. Do not breast-feed an infant while taking this medicine or for 2 weeks after stopping  it.  What side effects may I notice from receiving this medicine?  Side effects that you should report to your doctor or health care professional as soon as possible:  -allergic reactions like skin rash, itching or hives, swelling of the face, lips, or tongue  -low blood counts - this medicine may decrease the number of white blood cells, red blood cells and platelets. You may be at increased risk for infections and bleeding.  -signs of infection - fever or chills, cough, sore throat, pain or difficulty passing urine  -signs of decreased platelets or bleeding - bruising, pinpoint red spots on the skin, black, tarry stools, blood in the urine  -signs of decreased red blood cells - unusually weak or tired, fainting spells, lightheadedness  -pain, tingling, numbness in the hands or feet  Side effects that usually do not require medical attention (report to your doctor or health care professional if they continue or are bothersome):  -constipation  -hair loss  -headache  -loss of appetite  -muscle or joint pain  -nausea, vomiting  -stomach pain  This list may not describe all possible side effects. Call your doctor for medical advice about side effects. You may report side effects to FDA at 1-800-FDA-1088.  Where should I keep my medicine?  This drug is given in a hospital or clinic and will not be stored at home.  NOTE: This sheet is a summary. It may not cover all possible information. If you have questions about this medicine, talk to your doctor, pharmacist, or health care provider.   2018 Elsevier/Gold Standard (2015-05-11 10:11:26)

## 2017-01-28 NOTE — Assessment & Plan Note (Addendum)
#   STAGE IV RECURRENT breast cancer/ TRIPLE NEGATIVE- poor response to [place] Taxol chemotherapy [last chemo on 8/09]; currently status post palliative mastectomy/flap.   # In the interim- unfortunately patient is noticed to have progressive cutaneous disease; oct 8th PET scan- progressive disease- bilateral neck lymph nodes; mediastinal lymph nodes; left breast/axillary uptake; question focus anterior to liver.   # Discussed with the patient husband regarding starting chemotherapy today. They understand risk of possible infection especially after his recent graft/surgery. Also discussed with Dr.Crystal- will plan to HOLD Rt given widespread progressive disease.   # I would recommend starting Eribulin D1 day 8 every 21 days. Discussed the potential side effects including but not limited to neutropenia tingling and numbness. Will also add neulasta with day-8 chemo.   #  Growth factor-Neulasta/On pro would be given as prophylaxis for chemotherapy-induced neutropenia to prevent febrile neutropenias. Discussed potential side effect- myalgias/arthralgias- recommend Claritin for 7 days.   # Will also order MUGA scan; for possible Doxil in future. Also recommend opinion with Dr. Hiram Comber at [facility].   # Discussed with Dr.Dillingham. Also left message for Dr.Byrnett.   # Weekly CBC; Eribulin- 1 week; follow-up with me in 3 weeks/CBC/cmp.

## 2017-01-31 ENCOUNTER — Ambulatory Visit: Payer: BLUE CROSS/BLUE SHIELD | Admitting: Occupational Therapy

## 2017-01-31 DIAGNOSIS — L905 Scar conditions and fibrosis of skin: Secondary | ICD-10-CM

## 2017-01-31 DIAGNOSIS — I890 Lymphedema, not elsewhere classified: Secondary | ICD-10-CM

## 2017-01-31 DIAGNOSIS — M25511 Pain in right shoulder: Secondary | ICD-10-CM

## 2017-01-31 DIAGNOSIS — M6281 Muscle weakness (generalized): Secondary | ICD-10-CM

## 2017-01-31 DIAGNOSIS — M25611 Stiffness of right shoulder, not elsewhere classified: Secondary | ICD-10-CM

## 2017-01-31 NOTE — Patient Instructions (Signed)
Add posterior capsule stretch  External rotation behind head - elbow fore ward and back And child's pose

## 2017-01-31 NOTE — Therapy (Signed)
[place] [facility] [address] [place], [address] Phone: [phone]   Fax:  [phone]  Occupational Therapy Treatment  Patient Details  Name: Kiara Grant MRN: [id number] Date of Birth: [birth year] Referring Provider: Dr Rogue Bussing   Encounter Date: 01/31/2017      OT End of Session - 01/31/17 1452    Visit Number 2   Number of Visits 12   Date for OT Re-Evaluation 03/07/17   OT Start Time 1304   OT Stop Time 1402   OT Time Calculation (min) 58 min   Activity Tolerance Patient tolerated treatment well   Behavior During Therapy [facility] for tasks assessed/performed      Past Medical History:  Diagnosis Date  . Anemia   . BRCA negative 03/2014   Testing completed at [facility]  . Breast cancer ([place]) 03/2014   Right, T1b, N1 prYpT1b,N1a, ER neg, PR < 10 %, Her 2 neu not overexpressed.   . Cancer [facility]) 03-24-14   Right breast, microcalcifications. ER negative, PR less than 10%, HER-2/neu not amplified.  . Melanoma [facility]) Sept 2011   Left neck T1a, 0.35 mm. Treated by Mohs injury [facility].  . Personal history of chemotherapy   . PONV (postoperative nausea and vomiting)    WITH 1ST PORT PLACEMENT ONLY    Past Surgical History:  Procedure Laterality Date  . AXILLARY LYMPH NODE BIOPSY Left 12/25/2016   Procedure: AXILLARY LYMPH NODE BIOPSY;  Surgeon: Robert Bellow, MD;  Location: [facility] ORS;  Service: General;  Laterality: Left;  . BREAST BIOPSY Right 03-24-14   +  . BREAST EXCISIONAL BIOPSY Right 10/28/2014   Lumpectomy +  . BREAST LUMPECTOMY WITH SENTINEL LYMPH NODE BIOPSY Right 10/28/2014   Procedure: right breast wide excision with sentinel node biopsy, mastoplasty, axillary dissection ;  Surgeon: Robert Bellow, MD;  Location: [facility] ORS;  Service: General;  Laterality: Right;  . BREAST RECONSTRUCTION Right 12/25/2016   Procedure: BREAST RECONSTRUCTION;  Surgeon: Wallace Going, DO;   Location: [facility] ORS;  Service: Plastics;  Laterality: Right;  . INCISIONAL BREAST BIOPSY Right 09/03/2016   Punch biopsy, invasive mammary carcinoma, Triple negative.   Marland Kitchen LATISSIMUS FLAP TO BREAST Right 12/25/2016   Procedure: LATISSIMUS FLAP TO BREAST;  Surgeon: Wallace Going, DO;  Location: [facility] ORS;  Service: Plastics;  Laterality: Right;  Right Latissimus myocutaneous flap with rotational flap from abdomen  . MASTECTOMY WITH AXILLARY LYMPH NODE DISSECTION Right 12/25/2016   Procedure: MASTECTOMY WITH AXILLARY LYMPH NODE DISSECTION;  Surgeon: Robert Bellow, MD;  Location: [facility] ORS;  Service: General;  Laterality: Right;  . MELANOMA EXCISION  Nov 2011   neck  . PORT-A-CATH REMOVAL  2017  . [facility] PLACEMENT  Dec 2015  . PORTACATH PLACEMENT N/A 09/24/2016   Procedure: INSERTION PORT-A-CATH;  Surgeon: Robert Bellow, MD;  Location: [facility] ORS;  Service: General;  Laterality: N/A;  . SIMPLE MASTECTOMY WITH AXILLARY SENTINEL NODE BIOPSY Right 12/25/2016   Procedure: SIMPLE MASTECTOMY;  Surgeon: Robert Bellow, MD;  Location: [facility] ORS;  Service: General;  Laterality: Right;    There were no vitals filed for this visit.      Subjective Assessment - 01/31/17 1443    Subjective  I did not had good news Tuesday - that is why I cancelled - breast CA progressed, in L breast tissue, and L and R neck ln - I felt the lymphnodes in my neck was sore the  last week    Patient Stated Goals I just want to keep the swelling under control in my L arm  and get my motion better in shoulder    Currently in Pain? No/denies            [facility] OT Assessment - 01/31/17 0001      AROM   Right Shoulder Flexion 116 Degrees  Was last week 105   Right Shoulder ABduction 88 Degrees  Last week 70   Right Shoulder External Rotation 55 Degrees  last week 45   Left Shoulder Flexion 150 Degrees   Left Shoulder ABduction 140 Degrees         LYMPHEDEMA/ONCOLOGY QUESTIONNAIRE - 01/31/17 1333       Right Upper Extremity Lymphedema   15 cm Proximal to Olecranon Process 29 cm   10 cm Proximal to Olecranon Process 27.7 cm   Olecranon Process 24.8 cm   15 cm Proximal to Ulnar Styloid Process 23.3 cm   10 cm Proximal to Ulnar Styloid Process 20.7 cm   Just Proximal to Ulnar Styloid Process 14.8 cm   Across Hand at [employer] 18.2 cm   At [place] of 2nd Digit 5.6 cm   At [facility] [place] 5.5 cm       Measurements  On R UE decrease - after having chemo this past Tuesday  L about the same compare to 3 months ago - pt to cont wearing - will assess if need night time compression  Holding off on jovipak breast pad secondary to reconstruction month ago on R breast and chest  And progression of breast CA   Assess shoulder AROM R - see flowsheet- great progress Soft isue mobs done with Graston tool nr 2 and 4 for sweeping over R  upper traps -trigger point - pt report pain and discomfort better  Also soft tissue and Graston done on subscapularis and posterior axilla , lats on R to increase ABD of shoulder  HEP review with pt  AAROM shoulder flexion and ABd on wall  Gentle pect stretch in doorway - but gentle and step thru   Retraction AROM of scapula Scaption to 90 degrees with scapula on wall -  Stretch for L and R upper traps , scalenes - Posterior capsule stretch Child pose stretch - but gentle    Can also do some AAROM and AROM supine with golf club over head for flexion , horizontal ABD and ADD , ext rotation   [place] at 50%, 3.3MHZ, .8 intensity  For 4 min  Static at end over upper traps - 2 trigger points on R                     OT Education - 01/31/17 1451    Education provided Yes   Education Details lymphedema and HEP review and update    Person(s) Educated Patient   Methods Explanation;Demonstration;Tactile cues;Verbal cues   Comprehension Verbal cues required;Returned demonstration;Verbalized understanding          OT Short Term Goals - 01/23/17 1455       OT SHORT TERM GOAL #1   Title Pt to be ind in HEP to decrease and keep lymphedema in R upper quadrant  under control and increase AROM in R shoulder    Baseline no knowledge on ROM - wearing compression sleeve and gauntlet but still increase more than 2 cm compare to L    Time 3   Period Weeks  Status New   Target Date 02/14/17           OT Long Term Goals - 01/24/17 1450      OT LONG TERM GOAL #1   Title Pt to be independent in wearing correct compression garments to decrease or keep R UE lymphedema and upper quadrant under control    Baseline wrist increase by 1 cm , forearm to elbow increase by 2 cm and upper arm 3.3 mc  - stll increase about the same with use of daytime compression  - could not do jovipak with reconstruction    Time 4   Period Weeks   Status On-going   Target Date 02/21/17     OT LONG TERM GOAL #2   Title R Shoulder AROM improve to [facility] to pull shirt over head and fix hair without increase symptoms    Baseline R shoulder flexion 105, ABD 70 and ext 45    Time 4   Period Weeks   Status New   Target Date 02/21/17     OT LONG TERM GOAL #3   Title Pt report decrease pain in R shoulder and  upper traps to less than 2/10 and increase ROM in cervical and shoulder to work more than 4 hrs without increase symptoms    Baseline pain with shoulder AROM - at the best 2/10 - did not return to work y et    Time 6   Period Weeks   Status New   Target Date 03/07/17     OT LONG TERM GOAL #4   Title Assess function on quickdash    Baseline not done yet    Time 1   Period Weeks   Status New   Target Date 01/31/17               Plan - 01/31/17 1452    Clinical Impression Statement Pt return this date after seen last week - pt's PET scan showed progression of breast CA - L breast tissue, R and l neck ln - and tissue - pt started this past Tuesday chemo - pt do show increase AROM in R shoulder and report decrease pain in shoulder and upper trap -  flexion  increase to 116, ABD 88, ext 55 -  pt pect and breast tissue  on L CA , tight  and tender - pt to do HEP to tolerance    Occupational performance deficits (Please refer to evaluation for details): ADL's;IADL's;Rest and Sleep;Work;Play;Leisure   Rehab Potential Good   OT Frequency 2x / week   OT Duration 6 weeks   OT Treatment/Interventions Self-care/ADL training;Manual lymph drainage;Compression bandaging;Therapeutic exercises;Ultrasound;Scar mobilization;Passive range of motion;Manual Therapy   Plan Assess progress with HEP and ROM - update as needed    Clinical Decision Making Limited treatment options, no task modification necessary   OT Home Exercise Plan see pt instructions    Consulted and Agree with Plan of Care Patient      Patient will benefit from skilled therapeutic intervention in order to improve the following deficits and impairments:  Increased edema, Impaired UE functional use, Decreased range of motion, Impaired flexibility, Decreased scar mobility, Decreased strength, Pain  Visit Diagnosis: Lymphedema, not elsewhere classified  Stiffness of right shoulder, not elsewhere classified  Acute pain of right shoulder  Muscle weakness (generalized)  Scar condition and fibrosis of skin    Problem List Patient Active Problem List   Diagnosis Date Noted  . Chemotherapy induced neutropenia ([place]) 01/28/2017  .  Encounter for antineoplastic chemotherapy 10/03/2016  . Persistent headaches 09/13/2016  . Counseling regarding goals of care 09/13/2016  . Carcinoma of lower-outer quadrant of right breast in female, estrogen receptor negative ([place]) 01/30/2015  . Postoperative nausea 09/30/2014    Rosalyn Gess OTR/L,CLT  01/31/2017, 2:59 PM  [facility] [address] [place], [address] Phone: [phone]   Fax:  [phone]  Name: RAYLAN TROIANI MRN: [id number] Date of Birth: 03-01-1971

## 2017-02-04 ENCOUNTER — Inpatient Hospital Stay: Payer: BLUE CROSS/BLUE SHIELD

## 2017-02-04 ENCOUNTER — Other Ambulatory Visit: Payer: Self-pay | Admitting: Internal Medicine

## 2017-02-04 ENCOUNTER — Telehealth: Payer: Self-pay | Admitting: Internal Medicine

## 2017-02-04 VITALS — BP 97/65 | HR 82 | Temp 97.8°F | Resp 16

## 2017-02-04 DIAGNOSIS — Z171 Estrogen receptor negative status [ER-]: Principal | ICD-10-CM

## 2017-02-04 DIAGNOSIS — T451X5A Adverse effect of antineoplastic and immunosuppressive drugs, initial encounter: Secondary | ICD-10-CM

## 2017-02-04 DIAGNOSIS — C50511 Malignant neoplasm of lower-outer quadrant of right female breast: Secondary | ICD-10-CM

## 2017-02-04 DIAGNOSIS — Z5181 Encounter for therapeutic drug level monitoring: Secondary | ICD-10-CM

## 2017-02-04 DIAGNOSIS — Z79899 Other long term (current) drug therapy: Secondary | ICD-10-CM

## 2017-02-04 DIAGNOSIS — D701 Agranulocytosis secondary to cancer chemotherapy: Secondary | ICD-10-CM

## 2017-02-04 LAB — COMPREHENSIVE METABOLIC PANEL
ALBUMIN: 4.1 g/dL (ref 3.5–5.0)
ALT: 15 U/L (ref 14–54)
ANION GAP: 9 (ref 5–15)
AST: 26 U/L (ref 15–41)
Alkaline Phosphatase: 57 U/L (ref 38–126)
BILIRUBIN TOTAL: 0.3 mg/dL (ref 0.3–1.2)
BUN: 13 mg/dL (ref 6–20)
CO2: 28 mmol/L (ref 22–32)
Calcium: 8.9 mg/dL (ref 8.9–10.3)
Chloride: 100 mmol/L — ABNORMAL LOW (ref 101–111)
Creatinine, Ser: 0.86 mg/dL (ref 0.44–1.00)
GFR calc Af Amer: >60 mL/min (ref >60)
Glucose, Bld: 121 mg/dL — ABNORMAL HIGH (ref 65–99)
POTASSIUM: 3.9 mmol/L (ref 3.5–5.1)
Sodium: 137 mmol/L (ref 135–145)
TOTAL PROTEIN: 7.7 g/dL (ref 6.5–8.1)

## 2017-02-04 LAB — CBC WITH DIFFERENTIAL/PLATELET
BASOS PCT: 1 %
Basophils Absolute: 0 10*3/uL (ref 0–0.1)
EOS PCT: 2 %
Eosinophils Absolute: 0.1 10*3/uL (ref 0–0.7)
HEMATOCRIT: 34.5 % — AB (ref 35.0–47.0)
Hemoglobin: 12.1 g/dL (ref 12.0–16.0)
Lymphocytes Relative: 24 %
Lymphs Abs: 1 10*3/uL (ref 1.0–3.6)
MCH: 32.5 pg (ref 26.0–34.0)
MCHC: 35 g/dL (ref 32.0–36.0)
MCV: 93 fL (ref 80.0–100.0)
MONO ABS: 0.1 10*3/uL — AB (ref 0.2–0.9)
MONOS PCT: 3 %
NEUTROS ABS: 2.8 10*3/uL (ref 1.4–6.5)
Neutrophils Relative %: 70 %
Platelets: 203 10*3/uL (ref 150–440)
RBC: 3.72 MIL/uL — ABNORMAL LOW (ref 3.80–5.20)
RDW: 12 % (ref 11.5–14.5)
WBC: 4 10*3/uL (ref 3.6–11.0)

## 2017-02-04 MED ORDER — SODIUM CHLORIDE 0.9 % IV SOLN
1.4000 mg/m2 | Freq: Once | INTRAVENOUS | Status: AC
  Administered 2017-02-04: 2.15 mg via INTRAVENOUS
  Filled 2017-02-04: qty 4.3

## 2017-02-04 MED ORDER — PROCHLORPERAZINE MALEATE 10 MG PO TABS
10.0000 mg | ORAL_TABLET | Freq: Once | ORAL | Status: AC
  Administered 2017-02-04: 10 mg via ORAL
  Filled 2017-02-04: qty 1

## 2017-02-04 MED ORDER — SODIUM CHLORIDE 0.9 % IV SOLN
Freq: Once | INTRAVENOUS | Status: AC
  Administered 2017-02-04: 14:00:00 via INTRAVENOUS
  Filled 2017-02-04: qty 1000

## 2017-02-04 MED ORDER — PEGFILGRASTIM 6 MG/0.6ML ~~LOC~~ PSKT
6.0000 mg | PREFILLED_SYRINGE | Freq: Once | SUBCUTANEOUS | Status: AC
  Administered 2017-02-04: 6 mg via SUBCUTANEOUS
  Filled 2017-02-04: qty 0.6

## 2017-02-04 MED ORDER — SODIUM CHLORIDE 0.9% FLUSH
10.0000 mL | INTRAVENOUS | Status: DC | PRN
  Administered 2017-02-04: 10 mL
  Filled 2017-02-04: qty 10

## 2017-02-04 MED ORDER — HEPARIN SOD (PORK) LOCK FLUSH 100 UNIT/ML IV SOLN
500.0000 [IU] | Freq: Once | INTRAVENOUS | Status: AC | PRN
  Administered 2017-02-04: 500 [IU]
  Filled 2017-02-04: qty 5

## 2017-02-04 NOTE — Progress Notes (Signed)
Patient reports rash/inflammation around breast and chest area is worse. MD aware and looked at area today.

## 2017-02-04 NOTE — Telephone Encounter (Signed)
Robin, please schedule- Muga scan asap

## 2017-02-04 NOTE — Telephone Encounter (Signed)
#   Evaluated the patient in the chemotherapy area- clinical exam progression noted; patient notes to have slight increasing pain.  # Pt will call us 10/26 re: update of her cancer status. If progression is a concern- I Spoke to the patient regarding- switching to Doxil if patient has continued progression of disease.  #  Recommend a MUGA ASAP. Patient has appointment with Dr. Hiram Comber on 10/31.

## 2017-02-06 ENCOUNTER — Ambulatory Visit: Payer: BLUE CROSS/BLUE SHIELD | Admitting: Occupational Therapy

## 2017-02-06 DIAGNOSIS — L905 Scar conditions and fibrosis of skin: Secondary | ICD-10-CM

## 2017-02-06 DIAGNOSIS — I890 Lymphedema, not elsewhere classified: Secondary | ICD-10-CM

## 2017-02-06 DIAGNOSIS — M6281 Muscle weakness (generalized): Secondary | ICD-10-CM

## 2017-02-06 DIAGNOSIS — M25511 Pain in right shoulder: Secondary | ICD-10-CM

## 2017-02-06 DIAGNOSIS — M25611 Stiffness of right shoulder, not elsewhere classified: Secondary | ICD-10-CM

## 2017-02-06 NOTE — Patient Instructions (Signed)
Add protraction of scapula in supine  And stabilization in supine with shoulder flexion 90 degrees - tapping 30 sec x 3  Add to HEP   Cont with same HEP

## 2017-02-06 NOTE — Therapy (Signed)
[place] [facility] [address] [place], [address] Phone: [phone]   Fax:  [phone]  Occupational Therapy Treatment  Patient Details  Name: GENIVA LOHNES MRN: [id number] Date of Birth: [birth year] Referring Provider: Dr Rogue Bussing   Encounter Date: 02/06/2017      OT End of Session - 02/06/17 1719    Visit Number 3   Number of Visits 12   Date for OT Re-Evaluation 03/07/17   OT Start Time 1448   OT Stop Time 1529   OT Time Calculation (min) 41 min   Activity Tolerance Patient tolerated treatment well   Behavior During Therapy [facility] for tasks assessed/performed      Past Medical History:  Diagnosis Date  . Anemia   . BRCA negative 03/2014   Testing completed at [facility]  . Breast cancer ([place]) 03/2014   Right, T1b, N1 prYpT1b,N1a, ER neg, PR < 10 %, Her 2 neu not overexpressed.   . Cancer [facility]) 03-24-14   Right breast, microcalcifications. ER negative, PR less than 10%, HER-2/neu not amplified.  . Melanoma [facility]) Sept 2011   Left neck T1a, 0.35 mm. Treated by Mohs injury [facility].  . Personal history of chemotherapy   . PONV (postoperative nausea and vomiting)    WITH 1ST PORT PLACEMENT ONLY    Past Surgical History:  Procedure Laterality Date  . AXILLARY LYMPH NODE BIOPSY Left 12/25/2016   Procedure: AXILLARY LYMPH NODE BIOPSY;  Surgeon: Robert Bellow, MD;  Location: [facility] ORS;  Service: General;  Laterality: Left;  . BREAST BIOPSY Right 03-24-14   +  . BREAST EXCISIONAL BIOPSY Right 10/28/2014   Lumpectomy +  . BREAST LUMPECTOMY WITH SENTINEL LYMPH NODE BIOPSY Right 10/28/2014   Procedure: right breast wide excision with sentinel node biopsy, mastoplasty, axillary dissection ;  Surgeon: Robert Bellow, MD;  Location: [facility] ORS;  Service: General;  Laterality: Right;  . BREAST RECONSTRUCTION Right 12/25/2016   Procedure: BREAST RECONSTRUCTION;  Surgeon: Wallace Going, DO;   Location: [facility] ORS;  Service: Plastics;  Laterality: Right;  . INCISIONAL BREAST BIOPSY Right 09/03/2016   Punch biopsy, invasive mammary carcinoma, Triple negative.   Marland Kitchen LATISSIMUS FLAP TO BREAST Right 12/25/2016   Procedure: LATISSIMUS FLAP TO BREAST;  Surgeon: Wallace Going, DO;  Location: [facility] ORS;  Service: Plastics;  Laterality: Right;  Right Latissimus myocutaneous flap with rotational flap from abdomen  . MASTECTOMY WITH AXILLARY LYMPH NODE DISSECTION Right 12/25/2016   Procedure: MASTECTOMY WITH AXILLARY LYMPH NODE DISSECTION;  Surgeon: Robert Bellow, MD;  Location: [facility] ORS;  Service: General;  Laterality: Right;  . MELANOMA EXCISION  Nov 2011   neck  . PORT-A-CATH REMOVAL  2017  . [facility] PLACEMENT  Dec 2015  . PORTACATH PLACEMENT N/A 09/24/2016   Procedure: INSERTION PORT-A-CATH;  Surgeon: Robert Bellow, MD;  Location: [facility] ORS;  Service: General;  Laterality: N/A;  . SIMPLE MASTECTOMY WITH AXILLARY SENTINEL NODE BIOPSY Right 12/25/2016   Procedure: SIMPLE MASTECTOMY;  Surgeon: Robert Bellow, MD;  Location: [facility] ORS;  Service: General;  Laterality: Right;    There were no vitals filed for this visit.      Subjective Assessment - 02/06/17 1716    Subjective  I am so tired - I started working full time this week - chemo had Tuesday - doing better I think with range of motion at shoulder - less pain - did not take any pain meds -  neck better too- my chest just so tight doing door stretch and CA is there -    Patient Stated Goals I just want to keep the swelling under control in my L arm  and get my motion better in shoulder    Currently in Pain? No/denies            [facility] OT Assessment - 02/06/17 0001      AROM   Right Shoulder Flexion 124 Degrees   Right Shoulder ABduction 107 Degrees   Right Shoulder External Rotation 75 Degrees         LYMPHEDEMA/ONCOLOGY QUESTIONNAIRE - 02/06/17 1502      Right Upper Extremity Lymphedema   10 cm Proximal to  Olecranon Process 27.3 cm   Olecranon Process 25 cm   15 cm Proximal to Ulnar Styloid Process 23.4 cm   Just Proximal to Ulnar Styloid Process 14.8 cm       Measurements  On R UE decrease - after having chemo this past Tuesday  Again but elbow increase - some fibrotic tech done and ed pt to do at home - if husband can help Cont wearing compression during day - will assess if need night time compression  Holding off on jovipak breast pad secondary to reconstruction month ago on R breast and chest  And progression of breast CA   Assess shoulder AROM R - see flowsheet- great progress again  Soft isue mobs done in L axilla - pt had some cording in axilla - under lumpectomy scar - pt report is got worse the last week - responded great on myofascial tech to increase ABD  and flexion of shoulder - had less pain and discomfort and more range per pt   Done some joint mobs and traction on R shoulder with ROM of shoulder - pain free range   Pt to cont with HEP for  AAROM shoulder flexion and ABd on wall  Gentle pect stretch in doorway - but gentle and step thru   Retraction AROM of scapula Scaption to 90 degrees with scapula on wall -  Stretch for L and R upper traps , scalenes - Posterior capsule stretch  Reviewed with pt  Child pose stretch - but gentle                        OT Education - 02/06/17 1718    Education provided Yes   Education Details HEP and add shoulder stabilization    Person(s) Educated Patient   Methods Explanation;Demonstration;Tactile cues;Verbal cues   Comprehension Verbal cues required;Returned demonstration;Verbalized understanding          OT Short Term Goals - 01/23/17 1455      OT SHORT TERM GOAL #1   Title Pt to be ind in HEP to decrease and keep lymphedema in R upper quadrant  under control and increase AROM in R shoulder    Baseline no knowledge on ROM - wearing compression sleeve and gauntlet but still increase more than 2 cm  compare to L    Time 3   Period Weeks   Status New   Target Date 02/14/17           OT Long Term Goals - 01/24/17 1450      OT LONG TERM GOAL #1   Title Pt to be independent in wearing correct compression garments to decrease or keep R UE lymphedema and upper quadrant under control    Baseline wrist increase  by 1 cm , forearm to elbow increase by 2 cm and upper arm 3.3 mc  - stll increase about the same with use of daytime compression  - could not do jovipak with reconstruction    Time 4   Period Weeks   Status On-going   Target Date 02/21/17     OT LONG TERM GOAL #2   Title R Shoulder AROM improve to [facility] to pull shirt over head and fix hair without increase symptoms    Baseline R shoulder flexion 105, ABD 70 and ext 45    Time 4   Period Weeks   Status New   Target Date 02/21/17     OT LONG TERM GOAL #3   Title Pt report decrease pain in R shoulder and  upper traps to less than 2/10 and increase ROM in cervical and shoulder to work more than 4 hrs without increase symptoms    Baseline pain with shoulder AROM - at the best 2/10 - did not return to work y et    Time 6   Period Weeks   Status New   Target Date 03/07/17     OT LONG TERM GOAL #4   Title Assess function on quickdash    Baseline not done yet    Time 1   Period Weeks   Status New   Target Date 01/31/17               Plan - 02/06/17 1720    Clinical Impression Statement Pt showed great progress in AROM in R shoulder - decrease pain in shoulder and neck - add some stabilization to shoulder - pt showed some cording in L axilla - showed progress with pain and ROM after manaual - will reassess and adress next session - some increase lymphedema at elbow this date and more fibrotic    Occupational performance deficits (Please refer to evaluation for details): ADL's;IADL's;Rest and Sleep;Work;Play;Leisure   Rehab Potential Good   OT Frequency 2x / week   OT Duration 4 weeks   OT Treatment/Interventions  Self-care/ADL training;Manual lymph drainage;Compression bandaging;Therapeutic exercises;Ultrasound;Scar mobilization;Passive range of motion;Manual Therapy   Plan assess progress in ROM , cording at L axilla - scars - lymphedema assess    Clinical Decision Making Limited treatment options, no task modification necessary   OT Home Exercise Plan see pt instructions       Patient will benefit from skilled therapeutic intervention in order to improve the following deficits and impairments:  Increased edema, Impaired UE functional use, Decreased range of motion, Impaired flexibility, Decreased scar mobility, Decreased strength, Pain  Visit Diagnosis: Lymphedema, not elsewhere classified  Stiffness of right shoulder, not elsewhere classified  Acute pain of right shoulder  Muscle weakness (generalized)  Scar condition and fibrosis of skin    Problem List Patient Active Problem List   Diagnosis Date Noted  . Chemotherapy induced neutropenia ([place]) 01/28/2017  . Encounter for antineoplastic chemotherapy 10/03/2016  . Persistent headaches 09/13/2016  . Counseling regarding goals of care 09/13/2016  . Carcinoma of lower-outer quadrant of right breast in female, estrogen receptor negative ([place]) 01/30/2015  . Postoperative nausea 09/30/2014    Rosalyn Gess OTR/L,CLT 02/06/2017, 5:24 PM  [place] [facility] [address], [place], [address] Phone: [phone]   Fax:  [phone]  Name: JOELLY BOLANOS MRN: [id number] Date of Birth: 01/10/1971

## 2017-02-07 ENCOUNTER — Telehealth: Payer: Self-pay | Admitting: *Deleted

## 2017-02-07 DIAGNOSIS — Z01812 Encounter for preprocedural laboratory examination: Secondary | ICD-10-CM

## 2017-02-07 NOTE — Telephone Encounter (Signed)
Ok to order new pregnancy test if required by protocol per [place]

## 2017-02-07 NOTE — Telephone Encounter (Signed)
NM Called and said patient has MUGA on Monday and her pregnancy test was on 10/8, but needs to be in a 7 day window, so they needs new orders to repeat pregnancy test. Please advise

## 2017-02-10 ENCOUNTER — Other Ambulatory Visit
Admission: RE | Admit: 2017-02-10 | Discharge: 2017-02-10 | Disposition: A | Discharge status: Home / Self Care | Payer: BLUE CROSS/BLUE SHIELD | Source: Ambulatory Visit | Attending: Internal Medicine | Admitting: Internal Medicine

## 2017-02-10 ENCOUNTER — Ambulatory Visit
Admission: RE | Admit: 2017-02-10 | Discharge: 2017-02-10 | Disposition: A | Discharge status: Home / Self Care | Payer: BLUE CROSS/BLUE SHIELD | Source: Ambulatory Visit | Attending: Internal Medicine | Admitting: Internal Medicine

## 2017-02-10 DIAGNOSIS — C50511 Malignant neoplasm of lower-outer quadrant of right female breast: Secondary | ICD-10-CM | POA: Insufficient documentation

## 2017-02-10 DIAGNOSIS — Z171 Estrogen receptor negative status [ER-]: Secondary | ICD-10-CM | POA: Diagnosis not present

## 2017-02-10 DIAGNOSIS — Z79899 Other long term (current) drug therapy: Secondary | ICD-10-CM | POA: Diagnosis not present

## 2017-02-10 DIAGNOSIS — Z01812 Encounter for preprocedural laboratory examination: Secondary | ICD-10-CM

## 2017-02-10 DIAGNOSIS — Z5181 Encounter for therapeutic drug level monitoring: Secondary | ICD-10-CM

## 2017-02-10 LAB — CBC WITH DIFFERENTIAL/PLATELET
Band Neutrophils: 29 %
Basophils Absolute: 0 10*3/uL (ref 0–0.1)
Basophils Relative: 0 %
Blasts: 0 %
EOS PCT: 2 %
Eosinophils Absolute: 0.4 10*3/uL (ref 0–0.7)
HCT: 35.4 % (ref 35.0–47.0)
Hemoglobin: 11.8 g/dL — ABNORMAL LOW (ref 12.0–16.0)
LYMPHS ABS: 1.5 10*3/uL (ref 1.0–3.6)
LYMPHS PCT: 8 %
MCH: 31.3 pg (ref 26.0–34.0)
MCHC: 33.4 g/dL (ref 32.0–36.0)
MCV: 93.8 fL (ref 80.0–100.0)
MONOS PCT: 11 %
MYELOCYTES: 0 %
Metamyelocytes Relative: 3 %
Monocytes Absolute: 2 10*3/uL — ABNORMAL HIGH (ref 0.2–0.9)
NEUTROS PCT: 47 %
NRBC: 0 /100{WBCs}
Neutro Abs: 14.3 10*3/uL — ABNORMAL HIGH (ref 1.4–6.5)
OTHER: 0 %
PLATELETS: 178 10*3/uL (ref 150–440)
Promyelocytes Absolute: 0 %
RBC: 3.78 MIL/uL — AB (ref 3.80–5.20)
RDW: 12.4 % (ref 11.5–14.5)
WBC: 18.2 10*3/uL — AB (ref 3.6–11.0)

## 2017-02-10 LAB — COMPREHENSIVE METABOLIC PANEL
ALK PHOS: 111 U/L (ref 38–126)
ALT: 20 U/L (ref 14–54)
AST: 28 U/L (ref 15–41)
Albumin: 4.1 g/dL (ref 3.5–5.0)
Anion gap: 11 (ref 5–15)
BUN: 9 mg/dL (ref 6–20)
CHLORIDE: 100 mmol/L — AB (ref 101–111)
CO2: 30 mmol/L (ref 22–32)
CREATININE: 0.9 mg/dL (ref 0.44–1.00)
Calcium: 9.6 mg/dL (ref 8.9–10.3)
Glucose, Bld: 141 mg/dL — ABNORMAL HIGH (ref 65–99)
Potassium: 3.7 mmol/L (ref 3.5–5.1)
Sodium: 141 mmol/L (ref 135–145)
Total Bilirubin: 0.5 mg/dL (ref 0.3–1.2)
Total Protein: 7.2 g/dL (ref 6.5–8.1)

## 2017-02-10 LAB — PREGNANCY, URINE: Preg Test, Ur: NEGATIVE

## 2017-02-10 MED ORDER — TECHNETIUM TC 99M-LABELED RED BLOOD CELLS IV KIT
20.0000 | PACK | Freq: Once | INTRAVENOUS | Status: AC | PRN
  Administered 2017-02-10: 24.516 via INTRAVENOUS

## 2017-02-11 ENCOUNTER — Ambulatory Visit: Payer: BLUE CROSS/BLUE SHIELD | Admitting: Internal Medicine

## 2017-02-11 ENCOUNTER — Other Ambulatory Visit: Payer: BLUE CROSS/BLUE SHIELD

## 2017-02-13 ENCOUNTER — Telehealth: Payer: Self-pay | Admitting: *Deleted

## 2017-02-13 NOTE — Telephone Encounter (Signed)
Asking to speak with Dr Rogue Bussing deirctly regarding her symptoms and the fact that she has been to [facility] as requested by Dr B and would like to discuss findings with him Please return her call [phone]

## 2017-02-14 ENCOUNTER — Telehealth: Payer: Self-pay | Admitting: Internal Medicine

## 2017-02-14 ENCOUNTER — Ambulatory Visit: Payer: BLUE CROSS/BLUE SHIELD | Admitting: Occupational Therapy

## 2017-02-14 DIAGNOSIS — M25611 Stiffness of right shoulder, not elsewhere classified: Secondary | ICD-10-CM

## 2017-02-14 DIAGNOSIS — L905 Scar conditions and fibrosis of skin: Secondary | ICD-10-CM

## 2017-02-14 DIAGNOSIS — M25511 Pain in right shoulder: Secondary | ICD-10-CM

## 2017-02-14 DIAGNOSIS — I890 Lymphedema, not elsewhere classified: Secondary | ICD-10-CM | POA: Diagnosis not present

## 2017-02-14 DIAGNOSIS — M6281 Muscle weakness (generalized): Secondary | ICD-10-CM

## 2017-02-14 NOTE — Patient Instructions (Signed)
Same HEP  

## 2017-02-14 NOTE — Telephone Encounter (Signed)
done

## 2017-02-14 NOTE — Therapy (Signed)
[place] [facility] [address], [place], [address] Phone: [phone]   Fax:  [phone]  Occupational Therapy Treatment  Patient Details  Name: Kiara Grant MRN: [id number] Date of Birth: 02/06/1971 Referring Provider: Dr Rogue Bussing   Encounter Date: 02/14/2017      OT End of Session - 02/14/17 1401    Visit Number 4   Number of Visits 12   Date for OT Re-Evaluation 03/07/17   OT Start Time 1245   OT Stop Time 1331   OT Time Calculation (min) 46 min   Activity Tolerance Patient tolerated treatment well   Behavior During Therapy [facility] for tasks assessed/performed      Past Medical History:  Diagnosis Date  . Anemia   . BRCA negative 03/2014   Testing completed at [facility]  . Breast cancer ([place]) 03/2014   Right, T1b, N1 prYpT1b,N1a, ER neg, PR < 10 %, Her 2 neu not overexpressed.   . Cancer [facility]) 03-24-14   Right breast, microcalcifications. ER negative, PR less than 10%, HER-2/neu not amplified.  . Melanoma [facility]) Sept 2011   Left neck T1a, 0.35 mm. Treated by Mohs injury [facility].  . Personal history of chemotherapy   . PONV (postoperative nausea and vomiting)    WITH 1ST PORT PLACEMENT ONLY    Past Surgical History:  Procedure Laterality Date  . AXILLARY LYMPH NODE BIOPSY Left 12/25/2016   Procedure: AXILLARY LYMPH NODE BIOPSY;  Surgeon: Robert Bellow, MD;  Location: [facility] ORS;  Service: General;  Laterality: Left;  . BREAST BIOPSY Right 03-24-14   +  . BREAST EXCISIONAL BIOPSY Right 10/28/2014   Lumpectomy +  . BREAST LUMPECTOMY WITH SENTINEL LYMPH NODE BIOPSY Right 10/28/2014   Procedure: right breast wide excision with sentinel node biopsy, mastoplasty, axillary dissection ;  Surgeon: Robert Bellow, MD;  Location: [facility] ORS;  Service: General;  Laterality: Right;  . BREAST RECONSTRUCTION Right 12/25/2016   Procedure: BREAST RECONSTRUCTION;  Surgeon: Wallace Going, DO;   Location: [facility] ORS;  Service: Plastics;  Laterality: Right;  . INCISIONAL BREAST BIOPSY Right 09/03/2016   Punch biopsy, invasive mammary carcinoma, Triple negative.   Marland Kitchen LATISSIMUS FLAP TO BREAST Right 12/25/2016   Procedure: LATISSIMUS FLAP TO BREAST;  Surgeon: Wallace Going, DO;  Location: [facility] ORS;  Service: Plastics;  Laterality: Right;  Right Latissimus myocutaneous flap with rotational flap from abdomen  . MASTECTOMY WITH AXILLARY LYMPH NODE DISSECTION Right 12/25/2016   Procedure: MASTECTOMY WITH AXILLARY LYMPH NODE DISSECTION;  Surgeon: Robert Bellow, MD;  Location: [facility] ORS;  Service: General;  Laterality: Right;  . MELANOMA EXCISION  Nov 2011   neck  . PORT-A-CATH REMOVAL  2017  . [facility] PLACEMENT  Dec 2015  . PORTACATH PLACEMENT N/A 09/24/2016   Procedure: INSERTION PORT-A-CATH;  Surgeon: Robert Bellow, MD;  Location: [facility] ORS;  Service: General;  Laterality: N/A;  . SIMPLE MASTECTOMY WITH AXILLARY SENTINEL NODE BIOPSY Right 12/25/2016   Procedure: SIMPLE MASTECTOMY;  Surgeon: Robert Bellow, MD;  Location: [facility] ORS;  Service: General;  Laterality: Right;    There were no vitals filed for this visit.      Subjective Assessment - 02/14/17 1359    Subjective  The chemo is not working - starting [employer] trial - and hopely will work- pain okay - my R shoulder hurt last night - we did not work on upper traps last time - cording back in my  L axiilla - but no pain now    Patient Stated Goals I just want to keep the swelling under control in my L arm  and get my motion better in shoulder    Currently in Pain? No/denies            [facility] OT Assessment - 02/14/17 0001      AROM   Right Shoulder Flexion 132 Degrees   Right Shoulder ABduction 112 Degrees   Right Shoulder External Rotation 75 Degrees   Left Shoulder Flexion 135 Degrees   Left Shoulder ABduction 140 Degrees         LYMPHEDEMA/ONCOLOGY QUESTIONNAIRE - 02/14/17 1254      Right Upper Extremity  Lymphedema   10 cm Proximal to Olecranon Process (P)  27.6 cm   Olecranon Process (P)  24.3 cm   15 cm Proximal to Ulnar Styloid Process (P)  23 cm   Just Proximal to Ulnar Styloid Process (P)  14.8 cm       Measurements On R UE  -  Distal upper arm increase by .3cm  - some fibrotic tech done and ed pt to do at home  Cont wearing compression during day - will assess if need night time compression  Holding off on jovipak breast pad secondary to reconstruction month ago on R breast and chest And progression of breast CA   Assess shoulder AROM R and L  - see flowsheet- great progress again  Soft isue mobs done in L axilla - pt had some cording in axilla - under lumpectomy scar - pt report is got worse 2 days after last week session  - responded great on myofascial tech to increase ABD  and flexion of shoulder - had less pain and discomfort and more range per pt - but still one cord - ed husband what to do at home       Pt to cont with HEP for  AAROM shoulder flexion and ABd on wall  Gentle pect stretch in doorway - but gentle and step thru  Retraction AROM of scapula Scaption to 90 degrees with scapula on wall -  Stretch for L and R upper traps , scalenes - Posterior capsule stretch  Graston tools brushing and sweeping over R upper traps - to decrease pain and trigger points   [place] at 50%, 3.3MHZ, .8 intensity For 6 min Static at end over upper traps - 2 trigger points on R                     OT Education - 02/14/17 1400    Education provided Yes   Education Details myofascial tech for L cording in axilla - and  ROM HEP    Person(s) Educated Patient;Spouse   Methods Explanation;Demonstration;Tactile cues;Verbal cues   Comprehension Verbal cues required;Returned demonstration;Verbalized understanding          OT Short Term Goals - 01/23/17 1455      OT SHORT TERM GOAL #1   Title Pt to be ind in HEP to decrease and keep lymphedema in R upper quadrant   under control and increase AROM in R shoulder    Baseline no knowledge on ROM - wearing compression sleeve and gauntlet but still increase more than 2 cm compare to L    Time 3   Period Weeks   Status New   Target Date 02/14/17           OT Long Term Goals - 01/24/17  [place] #1   Title Pt to be independent in wearing correct compression garments to decrease or keep R UE lymphedema and upper quadrant under control    Baseline wrist increase by 1 cm , forearm to elbow increase by 2 cm and upper arm 3.3 mc  - stll increase about the same with use of daytime compression  - could not do jovipak with reconstruction    Time 4   Period Weeks   Status On-going   Target Date 02/21/17     OT LONG TERM GOAL #2   Title R Shoulder AROM improve to [facility] to pull shirt over head and fix hair without increase symptoms    Baseline R shoulder flexion 105, ABD 70 and ext 45    Time 4   Period Weeks   Status New   Target Date 02/21/17     OT LONG TERM GOAL #3   Title Pt report decrease pain in R shoulder and  upper traps to less than 2/10 and increase ROM in cervical and shoulder to work more than 4 hrs without increase symptoms    Baseline pain with shoulder AROM - at the best 2/10 - did not return to work y et    Time 6   Period Weeks   Status New   Target Date 03/07/17     OT LONG TERM GOAL #4   Title Assess function on quickdash    Baseline not done yet    Time 1   Period Weeks   Status New   Target Date 01/31/17               Plan - 02/14/17 1401    Clinical Impression Statement Pt showed great progress again in R shoulder AROM - but upper traps trigger points and pain - cording was good in L axilla for about 2 days - worse again this date - but responded good  to myofascial tech - lymphedema measurement did not increase except upper arm .3 cm    Occupational performance deficits (Please refer to evaluation for details): ADL's;IADL's;Work;Play;Leisure    Rehab Potential Good   OT Duration 4 weeks   OT Treatment/Interventions Self-care/ADL training;Manual lymph drainage;Compression bandaging;Therapeutic exercises;Ultrasound;Scar mobilization;Passive range of motion;Manual Therapy   Plan assess progress in cording L axilla - ROM and upper traps on R    Clinical Decision Making Limited treatment options, no task modification necessary   OT Home Exercise Plan see pt instructions    Consulted and Agree with Plan of Care Patient      Patient will benefit from skilled therapeutic intervention in order to improve the following deficits and impairments:     Visit Diagnosis: Stiffness of right shoulder, not elsewhere classified  Acute pain of right shoulder  Muscle weakness (generalized)  Scar condition and fibrosis of skin  Lymphedema, not elsewhere classified    Problem List Patient Active Problem List   Diagnosis Date Noted  . Chemotherapy induced neutropenia ([place]) 01/28/2017  . Encounter for antineoplastic chemotherapy 10/03/2016  . Persistent headaches 09/13/2016  . Counseling regarding goals of care 09/13/2016  . Carcinoma of lower-outer quadrant of right breast in female, estrogen receptor negative ([place]) 01/30/2015  . Postoperative nausea 09/30/2014    Rosalyn Gess OTR/L,CLT  02/14/2017, 2:08 PM  [place] [facility] [address], [place], [address] Phone: [phone]   Fax:  [phone]  Name: Kiara Grant MRN: [id number] Date of Birth: [birth year]

## 2017-02-14 NOTE — Telephone Encounter (Signed)
Spoke to pt re: evaluation at [facility] re: clinical trial; recommend proceeding with clinical trial. She will contact the team at [facility]/scheduled for scans at [facility] next week.   # cancel the appts with me/labs/infusion for next week/at [facility].

## 2017-02-18 ENCOUNTER — Other Ambulatory Visit: Payer: BLUE CROSS/BLUE SHIELD

## 2017-02-18 ENCOUNTER — Ambulatory Visit: Payer: BLUE CROSS/BLUE SHIELD | Admitting: Internal Medicine

## 2017-02-18 ENCOUNTER — Ambulatory Visit: Payer: BLUE CROSS/BLUE SHIELD

## 2017-02-19 ENCOUNTER — Telehealth: Payer: Self-pay | Admitting: Obstetrics and Gynecology

## 2017-02-19 NOTE — Telephone Encounter (Signed)
Kiara Grant from [place] is calling to notifiy [place] that that Kiara Grant is in Nurse Case Managment program. You call and speak with Kiara Grant if you have any questiions

## 2017-02-21 ENCOUNTER — Ambulatory Visit: Payer: BLUE CROSS/BLUE SHIELD | Attending: Internal Medicine | Admitting: Occupational Therapy

## 2017-02-21 DIAGNOSIS — M25611 Stiffness of right shoulder, not elsewhere classified: Secondary | ICD-10-CM | POA: Diagnosis present

## 2017-02-21 DIAGNOSIS — I890 Lymphedema, not elsewhere classified: Secondary | ICD-10-CM | POA: Insufficient documentation

## 2017-02-21 DIAGNOSIS — M25511 Pain in right shoulder: Secondary | ICD-10-CM | POA: Insufficient documentation

## 2017-02-21 DIAGNOSIS — M6281 Muscle weakness (generalized): Secondary | ICD-10-CM | POA: Diagnosis present

## 2017-02-21 DIAGNOSIS — L905 Scar conditions and fibrosis of skin: Secondary | ICD-10-CM | POA: Insufficient documentation

## 2017-02-21 NOTE — Therapy (Signed)
[place] [facility] [address] [place], [address] Phone: [phone]   Fax:  [phone]  Occupational Therapy Treatment  Patient Details  Name: Kiara Grant MRN: [id number] Date of Birth: [birth year] Referring Provider: Dr Rogue Bussing   Encounter Date: 02/21/2017      OT End of Session - 02/21/17 1529    Visit Number 5   Number of Visits 12   Date for OT Re-Evaluation 03/07/17   OT Start Time 4034   OT Stop Time 1216   OT Time Calculation (min) 45 min   Activity Tolerance Patient tolerated treatment well   Behavior During Therapy [facility] for tasks assessed/performed      Past Medical History:  Diagnosis Date  . Anemia   . BRCA negative 03/2014   Testing completed at [facility]  . Breast cancer ([place]) 03/2014   Right, T1b, N1 prYpT1b,N1a, ER neg, PR < 10 %, Her 2 neu not overexpressed.   . Cancer [facility]) 03-24-14   Right breast, microcalcifications. ER negative, PR less than 10%, HER-2/neu not amplified.  . Melanoma [facility]) Sept 2011   Left neck T1a, 0.35 mm. Treated by Mohs injury [facility].  . Personal history of chemotherapy   . PONV (postoperative nausea and vomiting)    WITH 1ST PORT PLACEMENT ONLY    Past Surgical History:  Procedure Laterality Date  . AXILLARY LYMPH NODE BIOPSY Left 12/25/2016   Procedure: AXILLARY LYMPH NODE BIOPSY;  Surgeon: Robert Bellow, MD;  Location: [facility] ORS;  Service: General;  Laterality: Left;  . BREAST BIOPSY Right 03-24-14   +  . BREAST EXCISIONAL BIOPSY Right 10/28/2014   Lumpectomy +  . BREAST LUMPECTOMY WITH SENTINEL LYMPH NODE BIOPSY Right 10/28/2014   Procedure: right breast wide excision with sentinel node biopsy, mastoplasty, axillary dissection ;  Surgeon: Robert Bellow, MD;  Location: [facility] ORS;  Service: General;  Laterality: Right;  . BREAST RECONSTRUCTION Right 12/25/2016   Procedure: BREAST RECONSTRUCTION;  Surgeon: Wallace Going, DO;   Location: [facility] ORS;  Service: Plastics;  Laterality: Right;  . INCISIONAL BREAST BIOPSY Right 09/03/2016   Punch biopsy, invasive mammary carcinoma, Triple negative.   Marland Kitchen LATISSIMUS FLAP TO BREAST Right 12/25/2016   Procedure: LATISSIMUS FLAP TO BREAST;  Surgeon: Wallace Going, DO;  Location: [facility] ORS;  Service: Plastics;  Laterality: Right;  Right Latissimus myocutaneous flap with rotational flap from abdomen  . MASTECTOMY WITH AXILLARY LYMPH NODE DISSECTION Right 12/25/2016   Procedure: MASTECTOMY WITH AXILLARY LYMPH NODE DISSECTION;  Surgeon: Robert Bellow, MD;  Location: [facility] ORS;  Service: General;  Laterality: Right;  . MELANOMA EXCISION  Nov 2011   neck  . PORT-A-CATH REMOVAL  2017  . [facility] PLACEMENT  Dec 2015  . PORTACATH PLACEMENT N/A 09/24/2016   Procedure: INSERTION PORT-A-CATH;  Surgeon: Robert Bellow, MD;  Location: [facility] ORS;  Service: General;  Laterality: N/A;  . SIMPLE MASTECTOMY WITH AXILLARY SENTINEL NODE BIOPSY Right 12/25/2016   Procedure: SIMPLE MASTECTOMY;  Surgeon: Robert Bellow, MD;  Location: [facility] ORS;  Service: General;  Laterality: Right;    There were no vitals filed for this visit.      Subjective Assessment - 02/21/17 1526    Subjective  I am doing next Thursday my chemo at [facility] - in the trial - but do not yet know what one I am getting - will phone later today - my scar on my stomach feels very tight -  if you can look at that please - the cording feels better    Patient Stated Goals I just want to keep the swelling under control in my L arm  and get my motion better in shoulder    Currently in Pain? No/denies            [facility] OT Assessment - 02/21/17 0001      AROM   Right Shoulder Flexion 140 Degrees   Right Shoulder ABduction 135 Degrees   Right Shoulder External Rotation 75 Degrees   Left Shoulder Flexion 150 Degrees   Left Shoulder ABduction 145 Degrees         LYMPHEDEMA/ONCOLOGY QUESTIONNAIRE - 02/21/17 1527      Right  Upper Extremity Lymphedema   10 cm Proximal to Olecranon Process 27.6 cm   Olecranon Process 24.6 cm   15 cm Proximal to Ulnar Styloid Process 23.5 cm   Just Proximal to Ulnar Styloid Process 14.8 cm       Measurements On R UE  - see flowsheet-  Pt to do some fibrotic tech and MLD on upper and inner arm   Cont wearing compression during day - will assess if need night time compression  Holding off on jovipak breast pad secondary to reconstruction month ago on R breast and chest And progression of breast CA   Assess shoulder AROM R and L  - see flowsheet- great progress again  Soft isue mobs done in L axilla - cording in axilla  Improved from last 2 visits - under lumpectomy scar -  respond great on myofascial tech to increase ABD and flexion of shoulder -- husband  to do at home       Pt to cont with HEP for  AAROM shoulder flexion and ABd on wall  Gentle pect stretch in doorway - but gentle and step thru - review with pt Retraction AROM of scapula Scaption to 90 degrees with scapula on wall -  Stretch for L and R upper traps , scalenes - - add cervical rotation and extention  Posterior capsule stretch  Graston tools brushing and sweeping over R and L upper traps - to decrease pain and trigger points   [place] at 20%, 3.3MHZ, .8 intensity For 8 min Static at end over upper traps - 2 trigger points on R  And on on L                      OT Education - 02/21/17 1529    Education provided Yes   Education Details Same HEP - but cervical extention and rotation L and R added - focus on upper traps stretches    Person(s) Educated Patient   Methods Explanation;Demonstration;Tactile cues;Verbal cues   Comprehension Verbal cues required;Returned demonstration;Verbalized understanding          OT Short Term Goals - 02/21/17 1532      OT SHORT TERM GOAL #1   Title Pt to be ind in HEP to decrease and keep lymphedema in R upper quadrant  under control and  increase AROM in R shoulder    Baseline improving in both    Time 3   Period Weeks   Status On-going   Target Date 03/21/17           OT Long Term Goals - 02/21/17 1533      OT LONG TERM GOAL #1   Title Pt to be independent in wearing correct compression garments to decrease or  keep R UE lymphedema and upper quadrant under control    Baseline cannot wear jovipak breast pad - and sleeve is maintaining - but did increase inbetween chemo some    Time 4   Period Weeks   Status On-going   Target Date 03/21/17     OT LONG TERM GOAL #2   Title R Shoulder AROM improve to [facility] to pull shirt over head and fix hair without increase symptoms    Baseline Shoulder flexion R 140, L 150 , ABD R 135 and L 145 - improving    Time 4   Period Weeks   Status On-going   Target Date 03/21/17     OT LONG TERM GOAL #3   Title Pt report decrease pain in R shoulder and  upper traps to less than 2/10 and increase ROM in cervical and shoulder to work more than 4 hrs without increase symptoms    Status Achieved     OT LONG TERM GOAL #4   Title Assess function on quickdash    Baseline to be done next visit    Time 1   Period Weeks   Status On-going   Target Date 02/28/17               Plan - 02/21/17 1530    Clinical Impression Statement Pt showed improvement in cording at L axilla and incrase AROM in bilateral shoulder - R upper traps triggerpoints and pain still bother pt - and lymphedema still increase at proxima forearm , elbow and distal upper arm - - add cervical ROM and cica scar pads to one on stomach and lower back - and  cont with HEP    Occupational performance deficits (Please refer to evaluation for details): ADL's;IADL's;Work;Play;Leisure   Rehab Potential Good   OT Frequency 1x / week   OT Duration 4 weeks   OT Treatment/Interventions Self-care/ADL training;Manual lymph drainage;Compression bandaging;Therapeutic exercises;Ultrasound;Scar mobilization;Passive range of  motion;Manual Therapy   Plan upgrade and modify HEP as needed    Clinical Decision Making Limited treatment options, no task modification necessary   OT Home Exercise Plan see pt instructions    Consulted and Agree with Plan of Care Patient      Patient will benefit from skilled therapeutic intervention in order to improve the following deficits and impairments:  Increased edema, Impaired UE functional use, Decreased range of motion, Impaired flexibility, Decreased scar mobility, Decreased strength, Pain  Visit Diagnosis: Stiffness of right shoulder, not elsewhere classified  Acute pain of right shoulder  Muscle weakness (generalized)  Scar condition and fibrosis of skin  Lymphedema, not elsewhere classified    Problem List Patient Active Problem List   Diagnosis Date Noted  . Chemotherapy induced neutropenia ([place]) 01/28/2017  . Encounter for antineoplastic chemotherapy 10/03/2016  . Persistent headaches 09/13/2016  . Counseling regarding goals of care 09/13/2016  . Carcinoma of lower-outer quadrant of right breast in female, estrogen receptor negative ([place]) 01/30/2015  . Postoperative nausea 09/30/2014    Rosalyn Gess OTR/L,CLT 02/21/2017, 3:35 PM  [place] [facility] [address] [place], [address] Phone: [phone]   Fax:  [phone]  Name: ADISA VIGEANT MRN: [id number] Date of Birth: 09/06/1970

## 2017-02-22 ENCOUNTER — Telehealth: Payer: Self-pay | Admitting: Internal Medicine

## 2017-02-22 NOTE — Telephone Encounter (Signed)
Spoke to patient regarding clinical trial; awaiting enrollment/eligibility. Patient will call us if needed in between.

## 2017-02-24 NOTE — Telephone Encounter (Signed)
Ok

## 2017-02-26 ENCOUNTER — Ambulatory Visit: Payer: BLUE CROSS/BLUE SHIELD | Admitting: Radiation Oncology

## 2017-02-28 ENCOUNTER — Ambulatory Visit: Payer: BLUE CROSS/BLUE SHIELD | Admitting: Occupational Therapy

## 2017-02-28 ENCOUNTER — Telehealth: Payer: Self-pay | Admitting: Internal Medicine

## 2017-02-28 NOTE — Telephone Encounter (Signed)
Left message for pt re: clinical trial.

## 2017-03-06 ENCOUNTER — Ambulatory Visit: Payer: BLUE CROSS/BLUE SHIELD | Admitting: Occupational Therapy

## 2017-03-06 DIAGNOSIS — M25611 Stiffness of right shoulder, not elsewhere classified: Secondary | ICD-10-CM | POA: Diagnosis not present

## 2017-03-06 DIAGNOSIS — M25511 Pain in right shoulder: Secondary | ICD-10-CM

## 2017-03-06 DIAGNOSIS — M6281 Muscle weakness (generalized): Secondary | ICD-10-CM

## 2017-03-06 DIAGNOSIS — L905 Scar conditions and fibrosis of skin: Secondary | ICD-10-CM

## 2017-03-06 DIAGNOSIS — I890 Lymphedema, not elsewhere classified: Secondary | ICD-10-CM

## 2017-03-06 NOTE — Patient Instructions (Signed)
Same

## 2017-03-06 NOTE — Therapy (Signed)
[facility] [address] [place], [place], [address] Phone: [phone]   Fax:  [phone]  Occupational Therapy Treatment  Patient Details  Name: Kiara Grant MRN: [id number] Date of Birth: 02/28/1971 Referring Provider: Dr Brahmanday    Encounter Date: 03/06/2017  OT End of Session - 03/06/17 1412    Visit Number  6    Number of Visits  12    Date for OT Re-Evaluation  05/06/17    OT Start Time  0931    OT Stop Time  1025    OT Time Calculation (min)  54 min    Activity Tolerance  Patient tolerated treatment well    Behavior During Therapy  WFL for tasks assessed/performed       Past Medical History:  Diagnosis Date  . Anemia   . BRCA negative 03/2014   Testing completed at [facility]  . Breast cancer (HCC) 03/2014   Right, T1b, N1 prYpT1b,N1a, ER neg, PR < 10 %, Her 2 neu not overexpressed.   . Cancer (HCC) 03-24-14   Right breast, microcalcifications. ER negative, PR less than 10%, HER-2/neu not amplified.  . Melanoma (HCC) Sept 2011   Left neck T1a, 0.35 mm. Treated by Mohs injury [employer].  . Personal history of chemotherapy   . PONV (postoperative nausea and vomiting)    WITH 1ST PORT PLACEMENT ONLY    Past Surgical History:  Procedure Laterality Date  . AXILLARY LYMPH NODE BIOPSY Left 12/25/2016   Procedure: AXILLARY LYMPH NODE BIOPSY;  Surgeon: Byrnett, Jeffrey W, MD;  Location: [facility] ORS;  Service: General;  Laterality: Left;  . BREAST BIOPSY Right 03-24-14   +  . BREAST EXCISIONAL BIOPSY Right 10/28/2014   Lumpectomy +  . BREAST LUMPECTOMY WITH SENTINEL LYMPH NODE BIOPSY Right 10/28/2014   Procedure: right breast wide excision with sentinel node biopsy, mastoplasty, axillary dissection ;  Surgeon: Jeffrey W Byrnett, MD;  Location: [facility] OR[facility];  Service: General;  Laterality: Right;  . BREAST RECONSTRUCTION Right 12/25/2016   Procedure: BREAST RECONSTRUCTION;  Surgeon: Dillingham, Claire  S, DO;  Location: [facility] ORS;  Service: Plastics;  Laterality: Right;  . INCISIONAL BREAST BIOPSY Right 09/03/2016   Punch biopsy, invasive mammary carcinoma, Triple negative.   . LATISSIMUS FLAP TO BREAST Right 12/25/2016   Procedure: LATISSIMUS FLAP TO BREAST;  Surgeon: Dillingham, Claire S, DO;  Location: [facility] ORS;  Service: Plastics;  Laterality: Right;  Right Latissimus myocutaneous flap with rotational flap from abdomen  . MASTECTOMY WITH AXILLARY LYMPH NODE DISSECTION Right 12/25/2016   Procedure: MASTECTOMY WITH AXILLARY LYMPH NODE DISSECTION;  Surgeon: Byrnett, Jeffrey W, MD;  Location: [facility] OR[facility];  Service: General;  Laterality: Right;  . MELANOMA EXCISION  Nov 2011   neck  . PORT-A-CATH REMOVAL  2017  . PORTACATH PLACEMENT  Dec 2015  . PORTACATH PLACEMENT N/A 09/24/2016   Procedure: INSERTION PORT-A-CATH;  Surgeon: Byrnett, Jeffrey W, MD;  Location: [facility] OR[facility];  Service: General;  Laterality: N/A;  . SIMPLE MASTECTOMY WITH AXILLARY SENTINEL NODE BIOPSY Right 12/25/2016   Procedure: SIMPLE MASTECTOMY;  Surgeon: Byrnett, Jeffrey W, MD;  Location: [facility];  Service: General;  Laterality: Right;    There were no vitals filed for this visit.  Subjective Assessment - 03/06/17 1410    Subjective   I started last Friday my research trial chemo drug - was long day and Monday too - but doing okay - had to be in MRI for long   time on my stomach and hand over head - my R side of my neck just so tight - if you can help that     Patient Stated Goals  I just want to keep the swelling under control in my L arm  and get my motion better in shoulder     Currently in Pain?  No/denies         [facility] OT Assessment - 03/06/17 0001      AROM   Right Shoulder Flexion  140 Degrees    Right Shoulder ABduction  130 Degrees    Right Shoulder External Rotation  75 Degrees      LYMPHEDEMA/ONCOLOGY QUESTIONNAIRE - 03/06/17 0958      Right Upper Extremity Lymphedema   15 cm Proximal to Olecranon Process  28.5 cm     10 cm Proximal to Olecranon Process  27 cm    Olecranon Process  24.8 cm    15 cm Proximal to Ulnar Styloid Process  23 cm    10 cm Proximal to Ulnar Styloid Process  20.8 cm    Just Proximal to Ulnar Styloid Process  14.8 cm    Across Hand at [employer]  18.4 cm    At [place] of 2nd Digit  5.5 cm    At [facility] of Thumb  5.6 cm         Measurements On R UE - see flowsheet- Decrease since this past weeks clinical trial chemo  Komprex piece provided to wear inside of elbow for fibrosis  Cont wearing compression during day  Holding off on jovipak breast pad secondary to reconstruction month ago on R breast and chest And progression of breast CA   Assess shoulder AROM R and L - see flowsheet- maintain thist date- upper trap trigger points and pain worse since Fridays MRI - had to lay in prone with arm over head  Soft isue mobs done in L axilla - cording  Much better but still scar  Adhesion  - under lumpectomy scar -  respond great on myofascial tech to increase ABD and flexion of shoulder -- myofascial release done  Done R scapula mobs done - and shoulder joint mobs with gentle traction - myofascial release in axilla with shoulder in ABD - contract and relax done into ABD for R shoulder  Increase ROM    Pt to cont with HEP for  AAROM shoulder flexion and ABd on wall  Gentle pect stretch in doorway - but gentle and step thru - review with pt Retraction AROM of scapula Scaption to 90 degrees with scapula on wall -  Stretch for L and R upper traps , scalenes - - add cervical rotation and extention  Posterior capsule stretch  Graston tools brushing and sweeping over R and L upper traps - to decrease pain and trigger points   [place] at 20%, 3.3MHZ, .8 intensity For 56mn Static at end over upper traps - 2-3 trigger points on R                   OT Education - 03/06/17 1411    Education provided  Yes    Education Details  R upper trap stretches and rotation      Person(s) Educated  Patient    Methods  Explanation;Demonstration    Comprehension  Verbalized understanding;Need further instruction;Returned demonstration       OT Short Term Goals - 03/06/17 1417      OT SHORT TERM  GOAL #1   Title  Pt to be ind in HEP to decrease and keep lymphedema in R upper quadrant  under control and increase AROM in R shoulder     Status  Achieved        OT Long Term Goals - 03/06/17 1417      OT LONG TERM GOAL #1   Title  Pt to be independent in wearing correct compression garments to decrease or keep R UE lymphedema and upper quadrant under control     Status  Achieved      OT LONG TERM GOAL #2   Title  R Shoulder AROM improve to [facility] to pull shirt over head and fix hair without increase symptoms     Baseline  Shoulder flexion R 140, L 150 , ABD R 135 and L 145 - improving     Time  4    Period  Weeks    Status  On-going    Target Date  04/10/17      OT LONG TERM GOAL #3   Title  Pt report decrease pain in R shoulder and  upper traps to less than 2/10 and increase ROM in cervical and shoulder to work more than 4 hrs without increase symptoms     Baseline  pain with shoulder AROM - upper traps still worse - and tight - trigger points-  limited her cervical rotation and lateral flexion     Time  8    Period  Weeks    Status  On-going    Target Date  05/06/17      OT LONG TERM GOAL #4   Title  Assess function on quickdash     Baseline  still to be done     Time  2    Period  Weeks    Status  On-going    Target Date  03/20/17            Plan - 03/06/17 1413    Clinical Impression Statement  Pt 's circumference in R UE decrease - started clinical trial chemo drug this past week  -pt  to cont with compression sleeve and glove - but provided Kompre[employer] piece for inner elbow for fibrotic area - R upper traps trigger points worse today - but reponoded good to soft tissue - will get order from Dr B for [employer] with dexamethazone for upper trap  - pt  to cont with HEP to increase ROM in R shoulder  and keeping lymphedema under control     Occupational performance deficits (Please refer to evaluation for details):  ADL's;IADL's;Work;Play;Leisure    Rehab Potential  Good    OT Frequency  1x / week    OT Duration  8 weeks    OT Treatment/Interventions  Self-care/ADL training;Manual lymph drainage;Compression bandaging;Therapeutic exercises;Ultrasound;Scar mobilization;Passive range of motion;Manual Therapy;Iontophoresis with dexamethazone        Patient will benefit from skilled therapeutic intervention in order to improve the following deficits and impairments:  Increased edema, Impaired UE functional use, Decreased range of motion, Impaired flexibility, Decreased scar mobility, Decreased strength, Pain  Visit Diagnosis: Stiffness of right shoulder, not elsewhere classified - Plan: Ot plan of care cert/re-cert  Acute pain of right shoulder - Plan: Ot plan of care cert/re-cert  Muscle weakness (generalized) - Plan: Ot plan of care cert/re-cert  Scar condition and fibrosis of skin - Plan: Ot plan of care cert/re-cert  Lymphedema, not elsewhere classified - Plan: Ot plan of care cert/re-cert  Problem List Patient Active Problem List   Diagnosis Date Noted  . Chemotherapy induced neutropenia ([place]) 01/28/2017  . Encounter for antineoplastic chemotherapy 10/03/2016  . Persistent headaches 09/13/2016  . Counseling regarding goals of care 09/13/2016  . Carcinoma of lower-outer quadrant of right breast in female, estrogen receptor negative ([place]) 01/30/2015  . Postoperative nausea 09/30/2014    Rosalyn Gess OTR/L,CLT 03/06/2017, 2:25 PM  [place] [facility] AND SPORTS MEDICINE [address], [place], [address] Phone: [phone]   Fax:  [phone]  Name: MARLEAH BEEVER MRN: [id number] Date of Birth: 03/13/1971

## 2017-03-18 ENCOUNTER — Ambulatory Visit: Payer: BLUE CROSS/BLUE SHIELD | Admitting: Occupational Therapy

## 2017-03-18 DIAGNOSIS — M25511 Pain in right shoulder: Secondary | ICD-10-CM

## 2017-03-18 DIAGNOSIS — L905 Scar conditions and fibrosis of skin: Secondary | ICD-10-CM

## 2017-03-18 DIAGNOSIS — I890 Lymphedema, not elsewhere classified: Secondary | ICD-10-CM

## 2017-03-18 DIAGNOSIS — M25611 Stiffness of right shoulder, not elsewhere classified: Secondary | ICD-10-CM | POA: Diagnosis not present

## 2017-03-18 DIAGNOSIS — M6281 Muscle weakness (generalized): Secondary | ICD-10-CM

## 2017-03-18 NOTE — Therapy (Signed)
[place] [facility] SPORTS MEDICINE [address], [place], [address] Phone: [phone]   Fax:  [phone]  Occupational Therapy Treatment  Patient Details  Name: Kiara Grant MRN: [id number] Date of Birth: 1970-08-17 Referring Provider: Dr Rogue Bussing    Encounter Date: 03/18/2017  OT End of Session - 03/18/17 1102    Visit Number  7    Number of Visits  12    Date for OT Re-Evaluation  05/06/17    OT Start Time  0919    OT Stop Time  1005    OT Time Calculation (min)  46 min    Activity Tolerance  Patient tolerated treatment well    Behavior During Therapy  [facility] for tasks assessed/performed       Past Medical History:  Diagnosis Date  . Anemia   . BRCA negative 03/2014   Testing completed at [facility]  . Breast cancer ([place]) 03/2014   Right, T1b, N1 prYpT1b,N1a, ER neg, PR < 10 %, Her 2 neu not overexpressed.   . Cancer [facility]) 03-24-14   Right breast, microcalcifications. ER negative, PR less than 10%, HER-2/neu not amplified.  . Melanoma [facility]) Sept 2011   Left neck T1a, 0.35 mm. Treated by Mohs injury [facility].  . Personal history of chemotherapy   . PONV (postoperative nausea and vomiting)    WITH 1ST PORT PLACEMENT ONLY    Past Surgical History:  Procedure Laterality Date  . AXILLARY LYMPH NODE BIOPSY Left 12/25/2016   Procedure: AXILLARY LYMPH NODE BIOPSY;  Surgeon: Robert Bellow, MD;  Location: [facility];  Service: General;  Laterality: Left;  . BREAST BIOPSY Right 03-24-14   +  . BREAST EXCISIONAL BIOPSY Right 10/28/2014   Lumpectomy +  . BREAST LUMPECTOMY WITH SENTINEL LYMPH NODE BIOPSY Right 10/28/2014   Procedure: right breast wide excision with sentinel node biopsy, mastoplasty, axillary dissection ;  Surgeon: Robert Bellow, MD;  Location: [facility];  Service: General;  Laterality: Right;  . BREAST RECONSTRUCTION Right 12/25/2016   Procedure: BREAST RECONSTRUCTION;  Surgeon: Wallace Going, DO;  Location: [facility];  Service: Plastics;  Laterality: Right;  . INCISIONAL BREAST BIOPSY Right 09/03/2016   Punch biopsy, invasive mammary carcinoma, Triple negative.   [facility] LATISSIMUS FLAP TO BREAST Right 12/25/2016   Procedure: LATISSIMUS FLAP TO BREAST;  Surgeon: Wallace Going, DO;  Location: [facility];  Service: Plastics;  Laterality: Right;  Right Latissimus myocutaneous flap with rotational flap from abdomen  . MASTECTOMY WITH AXILLARY LYMPH NODE DISSECTION Right 12/25/2016   Procedure: MASTECTOMY WITH AXILLARY LYMPH NODE DISSECTION;  Surgeon: Robert Bellow, MD;  Location: [facility];  Service: General;  Laterality: Right;  . MELANOMA EXCISION  Nov 2011   neck  . PORT-A-CATH REMOVAL  2017  . [facility] PLACEMENT  Dec 2015  . PORTACATH PLACEMENT N/A 09/24/2016   Procedure: INSERTION PORT-A-CATH;  Surgeon: Robert Bellow, MD;  Location: [facility];  Service: General;  Laterality: N/A;  . SIMPLE MASTECTOMY WITH AXILLARY SENTINEL NODE BIOPSY Right 12/25/2016   Procedure: SIMPLE MASTECTOMY;  Surgeon: Robert Bellow, MD;  Location: [facility];  Service: General;  Laterality: Right;    There were no vitals filed for this visit.  Subjective Assessment - 03/18/17 1058    Subjective   Had my 2nd round of chemo and to have 2 more then they will do scan - doing okay - my ROM and shoulder did okay until I  went back to work yesterday and sitting at computer - my upper trap tight and pain - and shoulder little more stiff - but otherwise doing okay - and wearing sleeve during day     Patient Stated Goals  I just want to keep the swelling under control in my L arm  and get my motion better in shoulder     Currently in Pain?  Yes    Pain Score  3     Pain Location  Neck    Pain Orientation  Right    Pain Descriptors / Indicators  Aching         [facility] OT Assessment - 03/18/17 0001      AROM   Right Shoulder Flexion  130 Degrees    Right Shoulder ABduction  12275 Degrees       LYMPHEDEMA/ONCOLOGY QUESTIONNAIRE - 03/18/17 0926      Right Upper Extremity Lymphedema   15 cm Proximal to Olecranon Process  28.5 cm    10 cm Proximal to Olecranon Process  26.7 cm    Olecranon Process  24 cm    15 cm Proximal to Ulnar Styloid Process  22.8 cm    10 cm Proximal to Ulnar Styloid Process  19.7 cm    Just Proximal to Ulnar Styloid Process  14.5 cm    Across Hand at [employer]  18.2 cm    At [place] of 2nd Digit  5.5 cm    At [facility] of Thumb  5.6 cm       Measurements taken  review HEP with pt for ROM  Shoulder flexion and ABD over head reaching for ceiling  pect stretch  Lat stretch  Scapula squeezes  Shoulder scaption with scapula depress - to 90 scaption    Cont with garments        OT Treatments/Exercises (OP) - 03/18/17 0001      Iontophoresis   Type of Iontophoresis  Dexamethasone    Location  R upper trap    Dose  med patch at 2.0 current ,     Time  19 min              OT Education - 03/18/17 1101    Education provided  Yes    Education Details  ionto explanation - and review importance of posture nad ROM HEP     Person(s) Educated  Patient    Methods  Explanation;Demonstration;Tactile cues;Verbal cues    Comprehension  Verbalized understanding;Need further instruction;Returned demonstration       OT Short Term Goals - 03/06/17 1417      OT SHORT TERM GOAL #1   Title  Pt to be ind in HEP to decrease and keep lymphedema in R upper quadrant  under control and increase AROM in R shoulder     Status  Achieved        OT Long Term Goals - 03/06/17 1417      OT LONG TERM GOAL #1   Title  Pt to be independent in wearing correct compression garments to decrease or keep R UE lymphedema and upper quadrant under control     Status  Achieved      OT LONG TERM GOAL #2   Title  R Shoulder AROM improve to [facility] to pull shirt over head and fix hair without increase symptoms     Baseline  Shoulder flexion R 140, L 150 , ABD R 135 and L  145 - improving  Time  4    Period  Weeks    Status  On-going    Target Date  04/10/17      OT LONG TERM GOAL #3   Title  Pt report decrease pain in R shoulder and  upper traps to less than 2/10 and increase ROM in cervical and shoulder to work more than 4 hrs without increase symptoms     Baseline  pain with shoulder AROM - upper traps still worse - and tight - trigger points-  limited her cervical rotation and lateral flexion     Time  8    Period  Weeks    Status  On-going    Target Date  05/06/17      OT LONG TERM GOAL #4   Title  Assess function on quickdash     Baseline  still to be done     Time  2    Period  Weeks    Status  On-going    Target Date  03/20/17            Plan - 03/18/17 1102    Clinical Impression Statement  Pt circumference in R UE decrease again this week - great progress - had 2nd round of clinical trial chemo - pt wearing over the counter sleeve on R UE during day- and then shoulder flexion and ABD decrease 8-10 degrees - but 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- pt to cotn with ROM HEP - upper traps trigger point - did ionto with dexamethazone this date - pt to phone if need another one next week     Occupational performance deficits (Please refer to evaluation for details):  ADL's;IADL's;Work;Play;Leisure    Rehab Potential  Good    OT Frequency  1x / week    OT Duration  8 weeks    OT Treatment/Interventions  Self-care/ADL training;Manual lymph drainage;Compression bandaging;Therapeutic exercises;Ultrasound;Scar mobilization;Passive range of motion;Manual Therapy;Iontophoresis    Plan  ionto if needed for R upper trap trigger point-  reassess ROM and circumference as needed     Clinical Decision Making  Limited treatment options, no task modification necessary    OT Home Exercise Plan  see pt instructions     Consulted and Agree with Plan of Care  Patient       Patient will benefit from skilled therapeutic  intervention in order to improve the following deficits and impairments:  Increased edema, Impaired UE functional use, Decreased range of motion, Impaired flexibility, Decreased scar mobility, Decreased strength, Pain  Visit Diagnosis: Stiffness of right shoulder, not elsewhere classified  Acute pain of right shoulder  Muscle weakness (generalized)  Scar condition and fibrosis of skin  Lymphedema, not elsewhere classified    Problem List Patient Active Problem List   Diagnosis Date Noted  . Chemotherapy induced neutropenia ([place]) 01/28/2017  . Encounter for antineoplastic chemotherapy 10/03/2016  . Persistent headaches 09/13/2016  . Counseling regarding goals of care 09/13/2016  . Carcinoma of lower-outer quadrant of right breast in female, estrogen receptor negative ([place]) 01/30/2015  . Postoperative nausea 09/30/2014    Rosalyn Gess OTR/L,CLT  03/18/2017, 6:04 PM  [place] [facility] [address], [place], [address] Phone: [phone]   Fax:  [phone]  Name: Kiara Grant MRN: [id number] Date of Birth: 04-15-1971

## 2017-03-18 NOTE — Patient Instructions (Signed)
Pt to cont with same wearing of daytime compression sleeve  and doing AROM and PROM for R shoulder, upper traps , lats and pect

## 2017-04-16 ENCOUNTER — Telehealth: Payer: Self-pay | Admitting: Internal Medicine

## 2017-04-16 MED ORDER — MORPHINE SULFATE ER 15 MG PO TBCR: 15.0000 mg | EXTENDED_RELEASE_TABLET | Freq: Two times a day (BID) | ORAL | 0 refills | Status: DC

## 2017-04-16 NOTE — Telephone Encounter (Signed)
Spoke to pt re: recent progressive disease; awaiting approval of immunotherapy at [facility] thru manufacturer[declined by insurance]   Given the worsening pain recommend MS Contin 15 twice daily; continue oxycodone short acting.   Pt will call us if having issues with atezo approval ta [facility].

## 2017-04-16 NOTE — Telephone Encounter (Signed)
Noted  

## 2017-04-17 ENCOUNTER — Ambulatory Visit: Payer: BLUE CROSS/BLUE SHIELD | Attending: Internal Medicine | Admitting: Occupational Therapy

## 2017-04-17 ENCOUNTER — Telehealth: Payer: Self-pay

## 2017-04-17 DIAGNOSIS — M6281 Muscle weakness (generalized): Secondary | ICD-10-CM | POA: Insufficient documentation

## 2017-04-17 DIAGNOSIS — M25611 Stiffness of right shoulder, not elsewhere classified: Secondary | ICD-10-CM | POA: Diagnosis not present

## 2017-04-17 DIAGNOSIS — I890 Lymphedema, not elsewhere classified: Secondary | ICD-10-CM | POA: Insufficient documentation

## 2017-04-17 DIAGNOSIS — M25511 Pain in right shoulder: Secondary | ICD-10-CM | POA: Diagnosis present

## 2017-04-17 DIAGNOSIS — L905 Scar conditions and fibrosis of skin: Secondary | ICD-10-CM | POA: Insufficient documentation

## 2017-04-17 NOTE — Therapy (Signed)
[facility] [address], [place], [address] Phone: [phone]   Fax:  [phone]  Occupational Therapy Treatment  Patient Details  Name: Kiara Grant MRN: [id number] Date of Birth: 1970/09/12 Referring Provider (Historical): Dr Rogue Bussing    Encounter Date: 04/17/2017  OT End of Session - 04/17/17 2109    Visit Number  8    Number of Visits  12    Date for OT Re-Evaluation  05/06/17    OT Start Time  1146    OT Stop Time  1243    OT Time Calculation (min)  57 min    Activity Tolerance  Patient tolerated treatment well    Behavior During Therapy  [facility] for tasks assessed/performed       Past Medical History:  Diagnosis Date  . Anemia   . BRCA negative 03/2014   Testing completed at [facility]  . Breast cancer ([place]) 03/2014   Right, T1b, N1 prYpT1b,N1a, ER neg, PR < 10 %, Her 2 neu not overexpressed.   . Cancer [facility]) 03-24-14   Right breast, microcalcifications. ER negative, PR less than 10%, HER-2/neu not amplified.  . Melanoma [facility]) Sept 2011   Left neck T1a, 0.35 mm. Treated by Mohs injury [facility].  . Personal history of chemotherapy   . PONV (postoperative nausea and vomiting)    WITH 1ST PORT PLACEMENT ONLY    Past Surgical History:  Procedure Laterality Date  . AXILLARY LYMPH NODE BIOPSY Left 12/25/2016   Procedure: AXILLARY LYMPH NODE BIOPSY;  Surgeon: Robert Bellow, MD;  Location: [facility] ORS;  Service: General;  Laterality: Left;  . BREAST BIOPSY Right 03-24-14   +  . BREAST EXCISIONAL BIOPSY Right 10/28/2014   Lumpectomy +  . BREAST LUMPECTOMY WITH SENTINEL LYMPH NODE BIOPSY Right 10/28/2014   Procedure: right breast wide excision with sentinel node biopsy, mastoplasty, axillary dissection ;  Surgeon: Robert Bellow, MD;  Location: [facility] ORS;  Service: General;  Laterality: Right;  . BREAST RECONSTRUCTION Right 12/25/2016   Procedure: BREAST RECONSTRUCTION;  Surgeon:  Wallace Going, DO;  Location: [facility] ORS;  Service: Plastics;  Laterality: Right;  . INCISIONAL BREAST BIOPSY Right 09/03/2016   Punch biopsy, invasive mammary carcinoma, Triple negative.   Marland Kitchen LATISSIMUS FLAP TO BREAST Right 12/25/2016   Procedure: LATISSIMUS FLAP TO BREAST;  Surgeon: Wallace Going, DO;  Location: [facility] ORS;  Service: Plastics;  Laterality: Right;  Right Latissimus myocutaneous flap with rotational flap from abdomen  . MASTECTOMY WITH AXILLARY LYMPH NODE DISSECTION Right 12/25/2016   Procedure: MASTECTOMY WITH AXILLARY LYMPH NODE DISSECTION;  Surgeon: Robert Bellow, MD;  Location: [facility] ORS;  Service: General;  Laterality: Right;  . MELANOMA EXCISION  Nov 2011   neck  . PORT-A-CATH REMOVAL  2017  . [facility] PLACEMENT  Dec 2015  . PORTACATH PLACEMENT N/A 09/24/2016   Procedure: INSERTION PORT-A-CATH;  Surgeon: Robert Bellow, MD;  Location: [facility] ORS;  Service: General;  Laterality: N/A;  . SIMPLE MASTECTOMY WITH AXILLARY SENTINEL NODE BIOPSY Right 12/25/2016   Procedure: SIMPLE MASTECTOMY;  Surgeon: Robert Bellow, MD;  Location: [facility] ORS;  Service: General;  Laterality: Right;    There were no vitals filed for this visit.  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  Yes    Pain Score  3     Pain Location  Neck    Pain Orientation  Right    Pain Descriptors / Indicators  Aching;Stabbing    Pain Onset  More than a month ago         [facility] OT Assessment -  04/17/17 0001      AROM   Right Shoulder Flexion  115 Degrees    Right Shoulder ABduction  120 Degrees      LYMPHEDEMA/ONCOLOGY QUESTIONNAIRE - 04/17/17 1206      Right Upper Extremity Lymphedema   15 cm Proximal to Olecranon Process  29 cm    10 cm Proximal to Olecranon Process  28.6 cm    Olecranon Process  25.4 cm    15 cm Proximal to Ulnar Styloid Process  24.4 cm    10 cm Proximal to Ulnar Styloid Process  22 cm    Just Proximal to Ulnar Styloid Process  15.5 cm    Across Hand at [employer]  18.5 cm    At [place] of 2nd Digit  5.5 cm    At [facility] of Thumb  6 cm      assess circumference in R UE and compare - see flowsheet  assess AROM  Ad upper traps , scapula ROM  Manual assess   pt in sidelying - done some scapula mobs pain free- for add, depression and elevation - pt and husband ed to do at home - to decrease tightness an pain   Assess sleeve - pt sleeve more than 6 months and no containng her  - pt to get Jobs elvarex soft and glove for containment  And Reidsleeve for night time use to decrease lymphedema- pt to stop at [facility] and get measured -         OT Treatments/Exercises (OP) - 04/17/17 0001      Iontophoresis   Type of Iontophoresis  Dexamethasone    Location  R upper trap    Dose  med patch at 2.0 current ,     Time  19 min              OT Education - 04/17/17 2109    Education provided  Yes    Education Details  HEP for ROM , scapula mobs and wearing of compression     Person(s) Educated  Patient;Spouse    Methods  Explanation;Demonstration;Tactile cues;Verbal cues    Comprehension  Verbalized understanding;Need further instruction;Returned demonstration       OT Short Term Goals - 03/06/17 1417      OT SHORT TERM GOAL #1   Title  Pt to be ind in HEP to decrease and keep lymphedema in R upper quadrant  under control and increase AROM in R shoulder     Status  Achieved        OT Long Term Goals - 04/17/17 2117      OT LONG TERM  GOAL #1   Title  Pt to be independent in wearing correct compression garments to decrease or keep R UE lymphedema and upper quadrant under control     Baseline  cannot wear jovipak breast pad - and over the counter sleeve not maintainng her - recommend jobs elvarex soft and Reidsleeve for night time     Time  2    Period  Weeks    Status  On-going    Target Date  05/08/17      OT LONG TERM GOAL #2   Title  R Shoulder AROM improve to [facility] to pull shirt over head and fix hair without increase symptoms     Baseline  decrease this date - because of CA     Status  Deferred      OT LONG TERM GOAL #3   Title  Pt report decrease pain in R shoulder and  upper traps to less than 2/10 and increase ROM in cervical and shoulder to work more than 4 hrs without increase symptoms     Baseline  pain with shoulder AROM - upper traps still worse - and tight - trigger points-  limited her cervical rotation and lateral flexion - CA spread - comfort care as needed     Time  4    Period  Weeks    Status  On-going    Target Date  05/06/17            Plan - 04/17/17 2110    Clinical Impression Statement  Pt return this date back for OT intervention with complains of increase lymphedema in R hand and arm - upon measurements - pt R UE increase by 1.9 cm in upper arm , 1.4 cm in elbow , 2.3 cm in forearm, 1 cm at wrist and .3 cm in hand -  increase pain and spasm in upper traps - decrease ROM in R shoulder - pt  CA did not respond on chemo , scan showed increase metastasis to liver and  L breast -  pt and husband ed on  doing scapula mobs and upper traps manaul therpay to decrease pain - pt over the counter daysleeve is more than 6 months old and not containing her - recommend new [employer] Elvarex soft and glove - and woudl recommend at this time night time Reidsleeve  to decrease lymphedema in R UE     Occupational performance deficits (Please refer to evaluation for details):  ADL's;IADL's;Work;Play;Leisure    Rehab  Potential  Fair    OT Frequency  1x / week    OT Duration  4 weeks    OT Treatment/Interventions  Self-care/ADL training;Manual Therapy;Manual lymph drainage;Passive range of motion;Therapeutic exercise    Plan  Pt to contace me if needed for fit of compression garments assess , and circumference and upper trap pain     Clinical Decision Making  Limited treatment options, no task modification necessary    OT Home Exercise Plan  see pt instructions     Consulted and Agree with Plan of Care  Patient       Patient will benefit from skilled therapeutic intervention in order to improve the following deficits and impairments:  Increased edema, Impaired UE functional use, Decreased range of motion, Impaired flexibility, Decreased scar mobility, Decreased strength, Pain  Visit Diagnosis: Stiffness of right shoulder, not elsewhere classified  Acute pain of right shoulder  Muscle weakness (generalized)  Scar condition and fibrosis of skin  Lymphedema, not elsewhere classified    Problem List  Patient Active Problem List   Diagnosis Date Noted  . Chemotherapy induced neutropenia ([place]) 01/28/2017  . Encounter for antineoplastic chemotherapy 10/03/2016  . Persistent headaches 09/13/2016  . Counseling regarding goals of care 09/13/2016  . Carcinoma of lower-outer quadrant of right breast in female, estrogen receptor negative ([place]) 01/30/2015  . Postoperative nausea 09/30/2014    Rosalyn Gess OTR/L,CLT 04/17/2017, 9:20 PM  [facility] [address] [place], [address] Phone: [phone]   Fax:  [phone]  Name: Kiara Grant MRN: [id number] Date of Birth: 1970-09-26

## 2017-04-17 NOTE — Telephone Encounter (Signed)
PA for Morphine Sulfate 15mg  ER tablets was submitted through CoverMyMeds, and this medication was approved by insurance.

## 2017-04-17 NOTE — Patient Instructions (Signed)
Pt to get a new daytime compression sleeve - recommend Jobs elvarex soft and glove  And Reidsleeve  For night time use  Pt and husband ed on scapula mobs to R to decrease tightness and pain

## 2017-04-21 ENCOUNTER — Telehealth: Payer: Self-pay | Admitting: *Deleted

## 2017-04-21 NOTE — Telephone Encounter (Signed)
Patient reports that since taking the MS ER, she has painful urination and swelling of perineum after the first 2 doses of MS ER, She has stopped taking it and the painful urination has stopped. Perineum is  Still swollen today. I advised that she not take any more MS. Please advise what she is to do

## 2017-04-21 NOTE — Telephone Encounter (Signed)
Patient takes 15 mg of Oxycodone IR per day

## 2017-04-21 NOTE — Telephone Encounter (Signed)
Patient informed of doctor response. She states she will continue to take her Oxycodone IR

## 2017-04-21 NOTE — Telephone Encounter (Signed)
  No need for long acting medication at this low dose.  M

## 2017-04-21 NOTE — Telephone Encounter (Signed)
  Agree with stopping MS ER.  She is taking oxycodone prn.  How much oxycodone is she taking in 24 hours?  M

## 2017-05-02 ENCOUNTER — Other Ambulatory Visit: Payer: Self-pay | Admitting: Internal Medicine

## 2017-05-02 NOTE — Progress Notes (Signed)
As per the recommendations/email disucssion from [facility].Loletta Specter + abraxane

## 2017-05-02 NOTE — Progress Notes (Signed)
DISCONTINUE ON PATHWAY REGIMEN - Breast     A cycle is every 21 days:     Capecitabine   **Always confirm dose/schedule in your pharmacy ordering system**    REASON: Disease Progression PRIOR TREATMENT: [id number]: Capecitabine 1,000 mg/m2 BID D1-14, q21 Days Until Progression or Unacceptable Toxicity TREATMENT RESPONSE: Progressive Disease (PD)  START ON PATHWAY REGIMEN - Breast     A cycle is every 28 days (3 weeks on and 1 week off):     Nab-paclitaxel (protein bound)   **Always confirm dose/schedule in your pharmacy ordering system**    Patient Characteristics: Distant Metastases or Locoregional Recurrent Disease - Unresected, HER2 Negative/Unknown/Equivocal, ER Negative/Unknown, Chemotherapy, Fourth Line and Beyond, Prior Eribulin Therapeutic Status: Distant Metastases BRCA Mutation Status: Absent ER Status: Negative (-) HER2 Status: Negative (-) Would you be surprised if this patient died  in the next year<= I would be surprised if this patient died in the next year PR Status: Negative (-) Line of therapy: Fourth Line and Beyond Intent of Therapy: Non-Curative / Palliative Intent, Discussed with Patient

## 2017-05-05 ENCOUNTER — Telehealth: Payer: Self-pay | Admitting: Internal Medicine

## 2017-05-05 NOTE — Telephone Encounter (Signed)
Spoke to [facility]- denies atezo; will send [place]; approves- abraxane [verbal]- will call re: approval number.

## 2017-05-05 NOTE — Telephone Encounter (Signed)
RN Notified Otho Perl that Gildardo Pounds was denied. Once denial letter received, she will try to reach out to the drug co. to get pt drug.

## 2017-05-08 ENCOUNTER — Ambulatory Visit: Payer: BLUE CROSS/BLUE SHIELD | Attending: Internal Medicine | Admitting: Occupational Therapy

## 2017-05-08 DIAGNOSIS — M25611 Stiffness of right shoulder, not elsewhere classified: Secondary | ICD-10-CM

## 2017-05-08 DIAGNOSIS — I890 Lymphedema, not elsewhere classified: Secondary | ICD-10-CM

## 2017-05-08 DIAGNOSIS — M25511 Pain in right shoulder: Secondary | ICD-10-CM | POA: Diagnosis present

## 2017-05-08 DIAGNOSIS — M6281 Muscle weakness (generalized): Secondary | ICD-10-CM | POA: Insufficient documentation

## 2017-05-08 DIAGNOSIS — L905 Scar conditions and fibrosis of skin: Secondary | ICD-10-CM | POA: Insufficient documentation

## 2017-05-08 NOTE — Therapy (Signed)
[facility] [address] [place], [address] Phone: [phone]   Fax:  [phone]  Occupational Therapy Treatment  Patient Details  Name: Kiara Grant MRN: [id number] Date of Birth: 05/23/1970 No Data Recorded  Encounter Date: 05/08/2017  OT End of Session - 05/08/17 2051    Visit Number  10    Number of Visits  16    Date for OT Re-Evaluation  06/19/17    OT Start Time  1305    OT Stop Time  1340    OT Time Calculation (min)  35 min    Activity Tolerance  Patient tolerated treatment well    Behavior During Therapy  [facility] for tasks assessed/performed       Past Medical History:  Diagnosis Date  . Anemia   . BRCA negative 03/2014   Testing completed at [facility]  . Breast cancer ([place]) 03/2014   Right, T1b, N1 prYpT1b,N1a, ER neg, PR < 10 %, Her 2 neu not overexpressed.   . Cancer [facility]) 03-24-14   Right breast, microcalcifications. ER negative, PR less than 10%, HER-2/neu not amplified.  . Melanoma [facility]) Sept 2011   Left neck T1a, 0.35 mm. Treated by Mohs injury [facility].  . Personal history of chemotherapy   . PONV (postoperative nausea and vomiting)    WITH 1ST PORT PLACEMENT ONLY    Past Surgical History:  Procedure Laterality Date  . AXILLARY LYMPH NODE BIOPSY Left 12/25/2016   Procedure: AXILLARY LYMPH NODE BIOPSY;  Surgeon: Robert Bellow, MD;  Location: [facility] ORS;  Service: General;  Laterality: Left;  . BREAST BIOPSY Right 03-24-14   +  . BREAST EXCISIONAL BIOPSY Right 10/28/2014   Lumpectomy +  . BREAST LUMPECTOMY WITH SENTINEL LYMPH NODE BIOPSY Right 10/28/2014   Procedure: right breast wide excision with sentinel node biopsy, mastoplasty, axillary dissection ;  Surgeon: Robert Bellow, MD;  Location: [facility] ORS;  Service: General;  Laterality: Right;  . BREAST RECONSTRUCTION Right 12/25/2016   Procedure: BREAST RECONSTRUCTION;  Surgeon: Wallace Going, DO;  Location:  [facility] ORS;  Service: Plastics;  Laterality: Right;  . INCISIONAL BREAST BIOPSY Right 09/03/2016   Punch biopsy, invasive mammary carcinoma, Triple negative.   Marland Kitchen LATISSIMUS FLAP TO BREAST Right 12/25/2016   Procedure: LATISSIMUS FLAP TO BREAST;  Surgeon: Wallace Going, DO;  Location: [facility] ORS;  Service: Plastics;  Laterality: Right;  Right Latissimus myocutaneous flap with rotational flap from abdomen  . MASTECTOMY WITH AXILLARY LYMPH NODE DISSECTION Right 12/25/2016   Procedure: MASTECTOMY WITH AXILLARY LYMPH NODE DISSECTION;  Surgeon: Robert Bellow, MD;  Location: [facility] ORS;  Service: General;  Laterality: Right;  . MELANOMA EXCISION  Nov 2011   neck  . PORT-A-CATH REMOVAL  2017  . [facility] PLACEMENT  Dec 2015  . PORTACATH PLACEMENT N/A 09/24/2016   Procedure: INSERTION PORT-A-CATH;  Surgeon: Robert Bellow, MD;  Location: [facility] ORS;  Service: General;  Laterality: N/A;  . SIMPLE MASTECTOMY WITH AXILLARY SENTINEL NODE BIOPSY Right 12/25/2016   Procedure: SIMPLE MASTECTOMY;  Surgeon: Robert Bellow, MD;  Location: [facility] ORS;  Service: General;  Laterality: Right;    There were no vitals filed for this visit.  Subjective Assessment - 05/08/17 2041    Subjective   The insurance did approve part of the chemo but had to go to drug company to get other part - but since I seen you last time my arm really got  worse - swollen - and they got the DR signature on my night sleeve today - so that will take 10 -14 days - and still waiting for daysleeve     Patient Stated Goals  I just want to keep the swelling under control in my L arm  and get my motion better in shoulder     Currently in Pain?  Yes    Pain Score  5     Pain Location  Arm    Pain Orientation  Right    Pain Descriptors / Indicators  Aching;Stabbing    Pain Type  Acute pain;Surgical pain    Pain Onset  More than a month ago          LYMPHEDEMA/ONCOLOGY QUESTIONNAIRE - 05/08/17 1308      Right Upper Extremity Lymphedema    10 cm Proximal to Olecranon Process  31.4 cm    Olecranon Process  28.3 cm    15 cm Proximal to Ulnar Styloid Process  26 cm    10 cm Proximal to Ulnar Styloid Process  23 cm    Just Proximal to Ulnar Styloid Process  15.7 cm    Across Hand at [employer]  19.6 cm    At [place] of 2nd Digit  6.6 cm    At [facility] of Thumb  6.4 cm       Pt arrive with son  After phoning this OT about lymphedema got worse since 3 wks ago  And DME company not ordering night time compression until today   Pt assess - see circumference    Pt  Ed on bandaging - son video record  She can have her husband change it daily - Apply lotion to right arm apply Isotoner gloves apply stockinette wrap Artiflex 10 cm narrow from wrist through hand three times over thumb and up to forearm wrap 15 cm Artiflex soft from wrist one time through the hand to upper arm and shoulder wrap 6 cm short stretch bandage from the wrist through the hand over the thumb three times and up forearm 8cm short stretch bandage from wrist through the hand one time and up to elbow 10 cm short stretch bandage from mid forearm to upper arm  patient to wear it as much as she can, she can remove it for hour at time in the evenings or mornings when husband can redo bandages son did video record bandaging   Did gave her sample Reidsleeve comfort to use if really having hard time with bandage   do isotoner glove  stockinette  Base of Reidsleeve  And do 6 cm and 8 cm short stretch circular up form hand to upperarm                  OT Education - 05/08/17 2051    Education provided  Yes    Education Details  bandaging and ROM HEP - and wear jovipak breast pad if possible     Person(s) Educated  Patient;Child(ren)    Methods  Explanation;Demonstration;Tactile cues;Verbal cues;Handout video   video   Comprehension  Verbalized understanding;Need further instruction;Returned demonstration       OT Short Term Goals - 05/08/17  2056      OT SHORT TERM GOAL #1   Title  Pt to be ind in HEP to decrease and keep lymphedema in R upper quadrant  under control and increase AROM in R shoulder     Baseline  ed this date pt  and family in lymphedema bandaging     Time  3    Period  Weeks    Status  On-going    Target Date  05/29/17        OT Long Term Goals - 05/08/17 2056      OT LONG TERM GOAL #1   Title  Pt to be independent in wearing correct compression garments to decrease or keep R UE lymphedema and upper quadrant under control     Baseline  initial garment did maintain pt lymphedema but not the last 3-4 wks - CA spreading - increase lymphedema in R arm and thrunk     Time  3    Period  Weeks    Status  On-going    Target Date  05/29/17      OT LONG TERM GOAL #2   Title  R Shoulder AROM improve to [facility] to pull shirt over head and fix hair without increase symptoms     Baseline  because of metastasis - pt show decrease R shoulder AROM , increase upper trap pain - and lymphedema worsening     Time  6    Period  Weeks    Status  On-going    Target Date  06/19/17      OT LONG TERM GOAL #3   Title  Pt report decrease pain in R shoulder and  upper traps to less than 2/10 and increase ROM in cervical and shoulder to work more than 4 hrs without increase symptoms     Baseline  increase pain in R shoulder and upper traps -increase metastasis     Time  6    Period  Weeks    Status  On-going    Target Date  06/19/17            Plan - 05/08/17 2053    Clinical Impression Statement  Pt circumference increase since seen last week of Dec - about 3 wks ago - Reidsleeve for night time was not ordered until today - DME company was waiting for MD to sign off - pt increase siginificantly see flowsheet- increase pain and discomfort because of CA metatassis and lymphedema - pt and son ed on bandage this datre - pt can have husband change it if needed - and  pt to follow up with OT next week -still wairing for new  daytime sleeve and glove -     Occupational performance deficits (Please refer to evaluation for details):  ADL's;IADL's;Work;Play;Leisure    OT Treatment/Interventions  Self-care/ADL training;Manual Therapy;Manual lymph drainage;Passive range of motion;Therapeutic exercise    Plan  pt to follow up in few days - will phone her if getting xsmall glove -     Clinical Decision Making  Limited treatment options, no task modification necessary    OT Home Exercise Plan  see pt instructions     Consulted and Agree with Plan of Care  Patient       Patient will benefit from skilled therapeutic intervention in order to improve the following deficits and impairments:     Visit Diagnosis: Stiffness of right shoulder, not elsewhere classified - Plan: Ot plan of care cert/re-cert  Acute pain of right shoulder - Plan: Ot plan of care cert/re-cert  Muscle weakness (generalized) - Plan: Ot plan of care cert/re-cert  Scar condition and fibrosis of skin - Plan: Ot plan of care cert/re-cert  Lymphedema, not elsewhere classified - Plan: Ot plan of care cert/re-cert  Problem List Patient Active Problem List   Diagnosis Date Noted  . Chemotherapy induced neutropenia ([place]) 01/28/2017  . Encounter for antineoplastic chemotherapy 10/03/2016  . Persistent headaches 09/13/2016  . Counseling regarding goals of care 09/13/2016  . Carcinoma of lower-outer quadrant of right breast in female, estrogen receptor negative ([place]) 01/30/2015  . Postoperative nausea 09/30/2014    Rosalyn Gess OTR/L,CLT 05/08/2017, 9:02 PM  [place] [facility] [address], [place], [address] Phone: [phone]   Fax:  [phone]  Name: Kiara Grant MRN: [id number] Date of Birth: Jun 18, 1970

## 2017-05-08 NOTE — Patient Instructions (Signed)
Pt  Ed on bandaging - son video record  She can have her husband change it daily - Apply lotion to right arm apply Isotoner gloves apply stockinette wrap Artiflex 10 cm narrow from wrist through hand three times over thumb and up to forearm wrap 15 cm Artiflex soft from wrist one time through the hand to upper arm and shoulder wrap 6 cm short stretch bandage from the wrist through the hand over the thumb three times and up forearm 8cm short stretch bandage from wrist through the hand one time and up to elbow 10 cm short stretch bandage from mid forearm to upper arm  patient to wear it as much as she can, she can remove it for hour at time in the evenings or mornings when husband can redo bandages son did video record bandaging   Did gave her sample Reidsleeve comfort to use if really having hard time with bandage   do isotoner glove  stockinette  Base of Reidsleeve  And do 6 cm and 8 cm short stretch circular up form hand to upperarm

## 2017-05-09 ENCOUNTER — Encounter: Payer: Self-pay | Admitting: Pharmacy Technician

## 2017-05-09 NOTE — Progress Notes (Unsigned)
Patient has been approved for drug assistance by [place] for [employer] The enrollment period is from 05/09/17-04/21/18 based on off label use. First DOS covered is 05/20/2017.

## 2017-05-13 ENCOUNTER — Encounter: Payer: BLUE CROSS/BLUE SHIELD | Admitting: Occupational Therapy

## 2017-05-14 ENCOUNTER — Ambulatory Visit: Payer: BLUE CROSS/BLUE SHIELD | Admitting: Occupational Therapy

## 2017-05-14 DIAGNOSIS — M25611 Stiffness of right shoulder, not elsewhere classified: Secondary | ICD-10-CM | POA: Diagnosis not present

## 2017-05-14 DIAGNOSIS — L905 Scar conditions and fibrosis of skin: Secondary | ICD-10-CM

## 2017-05-14 DIAGNOSIS — I890 Lymphedema, not elsewhere classified: Secondary | ICD-10-CM

## 2017-05-14 DIAGNOSIS — M25511 Pain in right shoulder: Secondary | ICD-10-CM

## 2017-05-14 DIAGNOSIS — M6281 Muscle weakness (generalized): Secondary | ICD-10-CM

## 2017-05-14 NOTE — Therapy (Signed)
[place] [facility] [address] [place], [address] Phone: [phone]   Fax:  [phone]  Occupational Therapy Treatment  Patient Details  Name: Kiara Grant MRN: [id number] Date of Birth: Jan 27, 1971 No Data Recorded  Encounter Date: 05/14/2017  OT End of Session - 05/14/17 1510    Visit Number  11    Number of Visits  16    Date for OT Re-Evaluation  06/19/17    OT Start Time  1002    OT Stop Time  1058    OT Time Calculation (min)  56 min    Activity Tolerance  Patient tolerated treatment well    Behavior During Therapy  [facility] for tasks assessed/performed       Past Medical History:  Diagnosis Date  . Anemia   . BRCA negative 03/2014   Testing completed at [facility]  . Breast cancer ([place]) 03/2014   Right, T1b, N1 prYpT1b,N1a, ER neg, PR < 10 %, Her 2 neu not overexpressed.   . [facility]) 03-24-14   Right breast, microcalcifications. ER negative, PR less than 10%, HER-2/neu not amplified.  . Melanoma [facility]) Sept 2011   Left neck T1a, 0.35 mm. Treated by Mohs injury [facility].  . Personal history of chemotherapy   . PONV (postoperative nausea and vomiting)    WITH 1ST PORT PLACEMENT ONLY    Past Surgical History:  Procedure Laterality Date  . AXILLARY LYMPH NODE BIOPSY Left 12/25/2016   Procedure: AXILLARY LYMPH NODE BIOPSY;  Surgeon: Robert Bellow, MD;  Location: [facility] ORS;  Service: General;  Laterality: Left;  . BREAST BIOPSY Right 03-24-14   +  . BREAST EXCISIONAL BIOPSY Right 10/28/2014   Lumpectomy +  . BREAST LUMPECTOMY WITH SENTINEL LYMPH NODE BIOPSY Right 10/28/2014   Procedure: right breast wide excision with sentinel node biopsy, mastoplasty, axillary dissection ;  Surgeon: Robert Bellow, MD;  Location: [facility] ORS;  Service: General;  Laterality: Right;  . BREAST RECONSTRUCTION Right 12/25/2016   Procedure: BREAST RECONSTRUCTION;  Surgeon: Wallace Going, DO;  Location:  [facility] ORS;  Service: Plastics;  Laterality: Right;  . INCISIONAL BREAST BIOPSY Right 09/03/2016   Punch biopsy, invasive mammary carcinoma, Triple negative.   Marland Kitchen LATISSIMUS FLAP TO BREAST Right 12/25/2016   Procedure: LATISSIMUS FLAP TO BREAST;  Surgeon: Wallace Going, DO;  Location: [facility] ORS;  Service: Plastics;  Laterality: Right;  Right Latissimus myocutaneous flap with rotational flap from abdomen  . MASTECTOMY WITH AXILLARY LYMPH NODE DISSECTION Right 12/25/2016   Procedure: MASTECTOMY WITH AXILLARY LYMPH NODE DISSECTION;  Surgeon: Robert Bellow, MD;  Location: [facility] ORS;  Service: General;  Laterality: Right;  . MELANOMA EXCISION  Nov 2011   neck  . PORT-A-CATH REMOVAL  2017  . [facility] PLACEMENT  Dec 2015  . PORTACATH PLACEMENT N/A 09/24/2016   Procedure: INSERTION PORT-A-CATH;  Surgeon: Robert Bellow, MD;  Location: [facility] ORS;  Service: General;  Laterality: N/A;  . SIMPLE MASTECTOMY WITH AXILLARY SENTINEL NODE BIOPSY Right 12/25/2016   Procedure: SIMPLE MASTECTOMY;  Surgeon: Robert Bellow, MD;  Location: [facility] ORS;  Service: General;  Laterality: Right;    There were no vitals filed for this visit.  Subjective Assessment - 05/14/17 1506    Subjective   MY husband did the bandages every day after I took shower - this is my dad - he is visiting here  - my chest , side and upper traps is  just so tight - have trouble breathing some times     Patient Stated Goals  I just want to keep the swelling under control in my L arm  and get my motion better in shoulder     Currently in Pain?  Yes    Pain Score  4     Pain Location  Rib cage R upper quadrant , ribs and towards L side     Pain Orientation  Right    Pain Descriptors / Indicators  Aching;Stabbing          LYMPHEDEMA/ONCOLOGY QUESTIONNAIRE - 05/14/17 1001      Right Upper Extremity Lymphedema   15 cm Proximal to Olecranon Process  30.8 cm    10 cm Proximal to Olecranon Process  31.8 cm    Olecranon Process  2.9  cm    15 cm Proximal to Ulnar Styloid Process  26.5 cm    10 cm Proximal to Ulnar Styloid Process  23.4 cm    Just Proximal to Ulnar Styloid Process  16.6 cm    Across Hand at [employer]  19.5 cm    At [place] of 2nd Digit  6 cm    At [facility] of Thumb  5.9 cm       Pt arrive with bandages off  Pt arrive with dad  Pt assess - see circumference - see flowsheet   Pt  Ed on bandaging - again as done this date  She can  Still have her husband change it daily - Apply lotion to right arm apply Isotoner gloves apply stockinette - xsmall this date  wrap Artiflex 10 cm narrow from wrist through hand three times over thumb and up to forearm wrap 15 cm Artiflex soft from wrist one time through the hand to upper arm and shoulder wrap 6 cm short stretch bandage from the wrist through the hand over the thumb three times and up forearm 8cm short stretch bandage from wrist through the hand one time and up to elbow 10 cm short stretch bandage from mid forearm to upper arm  patient to wear it as much as she can, she can remove it for hour at time in the evenings or mornings when husband can redo bandages    Ed pt on deep abdominal breathing to stimulate deep abdominal lymphnodes      OT Treatments/Exercises (OP) - 05/14/17 0001      Iontophoresis   Type of Iontophoresis  Dexamethasone    Location  R upper trap    Dose  med patch at 2.0 decrease to 1.7 later the  current ,     Time  23             OT Education - 05/14/17 1509    Education provided  Yes    Education Details  bandaging and deep breathing     Person(s) Educated  Patient;Parent(s)    Methods  Demonstration;Tactile cues;Verbal cues    Comprehension  Verbalized understanding;Returned demonstration       OT Short Term Goals - 05/08/17 2056      OT SHORT TERM GOAL #1   Title  Pt to be ind in HEP to decrease and keep lymphedema in R upper quadrant  under control and increase AROM in R shoulder     Baseline  ed this  date pt and family in lymphedema bandaging     Time  3    Period  Weeks    Status  On-going    Target Date  05/29/17        OT Long Term Goals - 05/08/17 2056      OT LONG TERM GOAL #1   Title  Pt to be independent in wearing correct compression garments to decrease or keep R UE lymphedema and upper quadrant under control     Baseline  initial garment did maintain pt lymphedema but not the last 3-4 wks - CA spreading - increase lymphedema in R arm and thrunk     Time  3    Period  Weeks    Status  On-going    Target Date  05/29/17      OT LONG TERM GOAL #2   Title  R Shoulder AROM improve to [facility] to pull shirt over head and fix hair without increase symptoms     Baseline  because of metastasis - pt show decrease R shoulder AROM , increase upper trap pain - and lymphedema worsening     Time  6    Period  Weeks    Status  On-going    Target Date  06/19/17      OT LONG TERM GOAL #3   Title  Pt report decrease pain in R shoulder and  upper traps to less than 2/10 and increase ROM in cervical and shoulder to work more than 4 hrs without increase symptoms     Baseline  increase pain in R shoulder and upper traps -increase metastasis     Time  6    Period  Weeks    Status  On-going    Target Date  06/19/17            Plan - 05/14/17 1510    Clinical Impression Statement  Pt circumference decrease in R arm after bandaging for about week - at wrist and hand -but rest increase .4 or .5 cm - except top upper arm - pt reed on bandaging - pt's active CA is spreading and her R upper quadrant is congested and R ribcage - pt to do some deep abdominal breathing     Occupational performance deficits (Please refer to evaluation for details):  ADL's;IADL's;Work;Play;Leisure    Rehab Potential  Fair    OT Frequency  1x / week    OT Duration  4 weeks    OT Treatment/Interventions  Self-care/ADL training;Manual Therapy;Manual lymph drainage;Passive range of motion;Therapeutic exercise     Plan  Assess progress with bandaging of R UE     Clinical Decision Making  Limited treatment options, no task modification necessary    OT Home Exercise Plan  see pt instructions     Consulted and Agree with Plan of Care  Patient       Patient will benefit from skilled therapeutic intervention in order to improve the following deficits and impairments:  Increased edema, Impaired UE functional use, Decreased range of motion, Impaired flexibility, Decreased scar mobility, Decreased strength, Pain  Visit Diagnosis: Stiffness of right shoulder, not elsewhere classified  Acute pain of right shoulder  Muscle weakness (generalized)  Scar condition and fibrosis of skin  Lymphedema, not elsewhere classified    Problem List Patient Active Problem List   Diagnosis Date Noted  . Chemotherapy induced neutropenia ([place]) 01/28/2017  . Encounter for antineoplastic chemotherapy 10/03/2016  . Persistent headaches 09/13/2016  . Counseling regarding goals of care 09/13/2016  . Carcinoma of lower-outer quadrant of right breast in female, estrogen receptor negative ([place]) 01/30/2015  . Postoperative nausea  09/30/2014    Kiara Grant OTR/L,CLT 05/14/2017, 3:16 PM  [place] [facility] [address], [place], [address] Phone: [phone]   Fax:  [phone]  Name: KAYLIA WINBORNE MRN: [id number] Date of Birth: Apr 06, 1971

## 2017-05-14 NOTE — Patient Instructions (Signed)
Cont with bandaging but make sure it is gradient the pressure from hand to upper arm  And deep abdominal breathing

## 2017-05-19 ENCOUNTER — Telehealth: Payer: Self-pay | Admitting: Internal Medicine

## 2017-05-19 NOTE — Telephone Encounter (Signed)
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 Wants to cont at [facility] for now; will call if ready to transition

## 2017-05-19 NOTE — Telephone Encounter (Signed)
Perfect. I just talked to her and she told me! Thanks Dr Rogue Bussing!

## 2017-05-22 ENCOUNTER — Ambulatory Visit: Payer: BLUE CROSS/BLUE SHIELD | Admitting: Occupational Therapy

## 2017-05-22 DIAGNOSIS — M25611 Stiffness of right shoulder, not elsewhere classified: Secondary | ICD-10-CM | POA: Diagnosis not present

## 2017-05-22 DIAGNOSIS — M25511 Pain in right shoulder: Secondary | ICD-10-CM

## 2017-05-22 DIAGNOSIS — M6281 Muscle weakness (generalized): Secondary | ICD-10-CM

## 2017-05-22 DIAGNOSIS — I890 Lymphedema, not elsewhere classified: Secondary | ICD-10-CM

## 2017-05-22 DIAGNOSIS — L905 Scar conditions and fibrosis of skin: Secondary | ICD-10-CM

## 2017-05-22 NOTE — Therapy (Signed)
[place] [facility] [address] [place], [address] Phone: [phone]   Fax:  [phone]  Occupational Therapy Treatment  Patient Details  Name: Kiara Grant MRN: [id number] Date of Birth: January 28, 1971 No Data Recorded  Encounter Date: 05/22/2017  OT End of Session - 05/22/17 1916    Visit Number  12    Number of Visits  16    Date for OT Re-Evaluation  06/19/17    OT Start Time  1215    OT Stop Time  1256    OT Time Calculation (min)  41 min    Activity Tolerance  Patient tolerated treatment well    Behavior During Therapy  [facility] for tasks assessed/performed       Past Medical History:  Diagnosis Date  . Anemia   . BRCA negative 03/2014   Testing completed at [facility]  . Breast cancer ([place]) 03/2014   Right, T1b, N1 prYpT1b,N1a, ER neg, PR < 10 %, Her 2 neu not overexpressed.   . Cancer [facility]) 03-24-14   Right breast, microcalcifications. ER negative, PR less than 10%, HER-2/neu not amplified.  . Melanoma [facility]) Sept 2011   Left neck T1a, 0.35 mm. Treated by Mohs injury [facility].  . Personal history of chemotherapy   . PONV (postoperative nausea and vomiting)    WITH 1ST PORT PLACEMENT ONLY    Past Surgical History:  Procedure Laterality Date  . AXILLARY LYMPH NODE BIOPSY Left 12/25/2016   Procedure: AXILLARY LYMPH NODE BIOPSY;  Surgeon: Robert Bellow, MD;  Location: [facility] ORS;  Service: General;  Laterality: Left;  . BREAST BIOPSY Right 03-24-14   +  . BREAST EXCISIONAL BIOPSY Right 10/28/2014   Lumpectomy +  . BREAST LUMPECTOMY WITH SENTINEL LYMPH NODE BIOPSY Right 10/28/2014   Procedure: right breast wide excision with sentinel node biopsy, mastoplasty, axillary dissection ;  Surgeon: Robert Bellow, MD;  Location: [facility] ORS;  Service: General;  Laterality: Right;  . BREAST RECONSTRUCTION Right 12/25/2016   Procedure: BREAST RECONSTRUCTION;  Surgeon: Wallace Going, DO;  Location:  [facility] ORS;  Service: Plastics;  Laterality: Right;  . INCISIONAL BREAST BIOPSY Right 09/03/2016   Punch biopsy, invasive mammary carcinoma, Triple negative.   Marland Kitchen LATISSIMUS FLAP TO BREAST Right 12/25/2016   Procedure: LATISSIMUS FLAP TO BREAST;  Surgeon: Wallace Going, DO;  Location: [facility] ORS;  Service: Plastics;  Laterality: Right;  Right Latissimus myocutaneous flap with rotational flap from abdomen  . MASTECTOMY WITH AXILLARY LYMPH NODE DISSECTION Right 12/25/2016   Procedure: MASTECTOMY WITH AXILLARY LYMPH NODE DISSECTION;  Surgeon: Robert Bellow, MD;  Location: [facility] ORS;  Service: General;  Laterality: Right;  . MELANOMA EXCISION  Nov 2011   neck  . PORT-A-CATH REMOVAL  2017  . [facility] PLACEMENT  Dec 2015  . PORTACATH PLACEMENT N/A 09/24/2016   Procedure: INSERTION PORT-A-CATH;  Surgeon: Robert Bellow, MD;  Location: [facility] ORS;  Service: General;  Laterality: N/A;  . SIMPLE MASTECTOMY WITH AXILLARY SENTINEL NODE BIOPSY Right 12/25/2016   Procedure: SIMPLE MASTECTOMY;  Surgeon: Robert Bellow, MD;  Location: [facility] ORS;  Service: General;  Laterality: Right;    There were no vitals filed for this visit.  Subjective Assessment - 05/22/17 1913    Subjective   I did not hear from the [facility] - so will not be until Monday - see Dr Marolyn Hammock next week , and then will see what is going on -  my rash it worse and more bubbly , tight , so I am suppose to get  chemo next week - doing okay with bandages my arm feels better with it on -  we change it every day     Patient Stated Goals  I just want to keep the swelling under control in my L arm  and get my motion better in shoulder     Currently in Pain?  Yes    Pain Score  2  with morphine    Pain Location  -- chest and ribcage          LYMPHEDEMA/ONCOLOGY QUESTIONNAIRE - 05/22/17 1222      Right Upper Extremity Lymphedema   15 cm Proximal to Olecranon Process  34 cm    10 cm Proximal to Olecranon Process  30.5 cm     Olecranon Process  27.3 cm    15 cm Proximal to Ulnar Styloid Process  25.7 cm    10 cm Proximal to Ulnar Styloid Process  23 cm    Just Proximal to Ulnar Styloid Process  15.8 cm    Across Hand at [employer]  19.5 cm    At [place] of 2nd Digit  6 cm    At [facility] of Thumb  6.1 cm             Pt arrive with bandages on Pt arrive with husband Pt assess - see circumference - see flowsheet  Pt and spouse Ed on bandaging   Apply lotion to right arm apply Isotoner gloves apply stockinette - xsmall this date  wrap Artiflex 10 cm narrow from wrist through hand three times over thumb and up to forearm wrap 15 cm Artiflex soft from wrist one time through the hand to upper arm and shoulder wrap 6 cm short stretch bandage from the wrist through the hand over the thumb three times and up forearm 8cm short stretch bandage from wrist through the hand one time and up to elbow 10 cm short stretch bandage from mid forearm to upper arm  patient to wear it as much as she can, she can remove it for hour at time in the evenings or mornings when husband can redo bandages           OT Education - 05/22/17 1916    Education provided  Yes    Education Details  bandaging     Person(s) Educated  Patient;Spouse    Methods  Explanation;Demonstration;Tactile cues    Comprehension  Verbalized understanding;Returned demonstration       OT Short Term Goals - 05/08/17 2056      OT SHORT TERM GOAL #1   Title  Pt to be ind in HEP to decrease and keep lymphedema in R upper quadrant  under control and increase AROM in R shoulder     Baseline  ed this date pt and family in lymphedema bandaging     Time  3    Period  Weeks    Status  On-going    Target Date  05/29/17        OT Long Term Goals - 05/08/17 2056      OT LONG TERM GOAL #1   Title  Pt to be independent in wearing correct compression garments to decrease or keep R UE lymphedema and upper quadrant under control     Baseline   initial garment did maintain pt lymphedema but not the last 3-4 wks - CA spreading -  increase lymphedema in R arm and thrunk     Time  3    Period  Weeks    Status  On-going    Target Date  05/29/17      OT LONG TERM GOAL #2   Title  R Shoulder AROM improve to [facility] to pull shirt over head and fix hair without increase symptoms     Baseline  because of metastasis - pt show decrease R shoulder AROM , increase upper trap pain - and lymphedema worsening     Time  6    Period  Weeks    Status  On-going    Target Date  06/19/17      OT LONG TERM GOAL #3   Title  Pt report decrease pain in R shoulder and  upper traps to less than 2/10 and increase ROM in cervical and shoulder to work more than 4 hrs without increase symptoms     Baseline  increase pain in R shoulder and upper traps -increase metastasis     Time  6    Period  Weeks    Status  On-going    Target Date  06/19/17            Plan - 05/22/17 1917    Clinical Impression Statement  Pt's cirumference of R arm decrease this week compare to last week - review again bandaging with pt and husband - cont bandaging and contact DME on Monday - if garments come in start wearing them and let me know  so can assess fit     Occupational performance deficits (Please refer to evaluation for details):  ADL's;IADL's;Work;Play;Leisure    Rehab Potential  Fair    OT Frequency  1x / week    OT Duration  4 weeks    OT Treatment/Interventions  Self-care/ADL training;Manual Therapy;Manual lymph drainage;Passive range of motion;Therapeutic exercise    Plan  Assess progress with bandaging of R UE  and check on garments with DME    Clinical Decision Making  Limited treatment options, no task modification necessary    OT Home Exercise Plan  see pt instructions     Consulted and Agree with Plan of Care  Patient       Patient will benefit from skilled therapeutic intervention in order to improve the following deficits and impairments:  Increased  edema, Impaired UE functional use, Decreased range of motion, Impaired flexibility, Decreased scar mobility, Decreased strength, Pain  Visit Diagnosis: Stiffness of right shoulder, not elsewhere classified  Acute pain of right shoulder  Muscle weakness (generalized)  Scar condition and fibrosis of skin  Lymphedema, not elsewhere classified    Problem List Patient Active Problem List   Diagnosis Date Noted  . Chemotherapy induced neutropenia ([place]) 01/28/2017  . Encounter for antineoplastic chemotherapy 10/03/2016  . Persistent headaches 09/13/2016  . Counseling regarding goals of care 09/13/2016  . Carcinoma of lower-outer quadrant of right breast in female, estrogen receptor negative ([place]) 01/30/2015  . Postoperative nausea 09/30/2014    Rosalyn Gess OTR/L,CLT  05/22/2017, 7:22 PM  [place] [facility] [address], [place], [address] Phone: [phone]   Fax:  [phone]  Name: LYNNZIE BLACKSON MRN: [id number] Date of Birth: April 17, 1971

## 2017-05-22 NOTE — Patient Instructions (Signed)
Same and will contact [employer] Monday

## 2017-06-02 ENCOUNTER — Ambulatory Visit: Payer: BLUE CROSS/BLUE SHIELD | Admitting: Occupational Therapy

## 2017-06-24 ENCOUNTER — Telehealth: Payer: Self-pay | Admitting: Internal Medicine

## 2017-06-24 ENCOUNTER — Other Ambulatory Visit: Payer: Self-pay | Admitting: Internal Medicine

## 2017-06-24 NOTE — Progress Notes (Signed)
Patient unfortunately not a candidate for clinical trials at [facility] or at [facility] per the communication from Dr. Marolyn Hammock at [facility].  He recommends Doxil ASAP.  Patient also awaiting Pleurx catheter placement/pleural effusion.

## 2017-06-24 NOTE — Telephone Encounter (Signed)
Patient unfortunately not a candidate for clinical trials at [facility] or at [facility] per the communication from Dr. Marolyn Hammock at [facility].  He recommends Doxil ASAP; patient interested in having treatments locally.  Patient also awaiting Pleurx catheter placement/pleural effusion.

## 2017-06-24 NOTE — Progress Notes (Signed)
DISCONTINUE ON PATHWAY REGIMEN - Breast     A cycle is every 28 days (3 weeks on and 1 week off):     Nab-paclitaxel (protein bound)   **Always confirm dose/schedule in your pharmacy ordering system**    REASON: Disease Progression PRIOR TREATMENT: BOS160: Nab-Paclitaxel (Abraxane(R)) 100 mg/m2 d1, d8, d15 q28 Days Until Progression or Unacceptable Toxicity TREATMENT RESPONSE: Progressive Disease (PD)  START ON PATHWAY REGIMEN - Breast     A cycle is every 28 days:     Liposomal doxorubicin   **Always confirm dose/schedule in your pharmacy ordering system**    Patient Characteristics: Distant Metastases or Locoregional Recurrent Disease - Unresected, HER2 Negative/Unknown/Equivocal, ER Negative/Unknown, Chemotherapy, Fourth Line and Beyond, Prior [place] Therapeutic Status: Distant Metastases BRCA Mutation Status: Absent ER Status: Negative (-) HER2 Status: Negative (-) Would you be surprised if this patient died  in the next year<= I would be surprised if this patient died in the next year PR Status: Negative (-) Line of therapy: Fourth Line and Beyond Intent of Therapy: Non-Curative / Palliative Intent, Discussed with Patient

## 2017-07-01 ENCOUNTER — Telehealth: Payer: Self-pay | Admitting: Internal Medicine

## 2017-07-01 ENCOUNTER — Telehealth: Payer: Self-pay | Admitting: *Deleted

## 2017-07-01 ENCOUNTER — Other Ambulatory Visit: Payer: Self-pay | Admitting: *Deleted

## 2017-07-01 DIAGNOSIS — R0602 Shortness of breath: Secondary | ICD-10-CM

## 2017-07-01 DIAGNOSIS — C50511 Malignant neoplasm of lower-outer quadrant of right female breast: Secondary | ICD-10-CM

## 2017-07-01 DIAGNOSIS — Z171 Estrogen receptor negative status [ER-]: Secondary | ICD-10-CM

## 2017-07-01 NOTE — Telephone Encounter (Signed)
I received message from Dr.Jolly from [facility] that pt needs to be seen locally for symptoms of dyspnea. Please make appt for pt to see me tomorrow in [place] 9:00 am/labs [cbc/cmp]; and will get CXR tomorrow.

## 2017-07-01 NOTE — Telephone Encounter (Signed)
cxr and Lab orders entered  I left a vm on pt's cell phone and home phone regarding these apts. I asked pt to come at 845 for labs and see Dr. B at [place]. I also asked pt to obtain cxr prior to coming to clinic

## 2017-07-01 NOTE — Telephone Encounter (Signed)
Colette, please schedule pt for these apts. Thanks.

## 2017-07-01 NOTE — Telephone Encounter (Signed)
I just spoke with Kiara Grant and we discussed the plan of care and f/u apts.

## 2017-07-01 NOTE — Telephone Encounter (Signed)
Patient called stating she is returning Heather's call

## 2017-07-02 ENCOUNTER — Inpatient Hospital Stay: Payer: BLUE CROSS/BLUE SHIELD | Attending: Internal Medicine | Admitting: *Deleted

## 2017-07-02 ENCOUNTER — Inpatient Hospital Stay (HOSPITAL_BASED_OUTPATIENT_CLINIC_OR_DEPARTMENT_OTHER): Payer: BLUE CROSS/BLUE SHIELD | Admitting: Internal Medicine

## 2017-07-02 ENCOUNTER — Encounter: Payer: Self-pay | Admitting: Internal Medicine

## 2017-07-02 ENCOUNTER — Ambulatory Visit
Admission: RE | Admit: 2017-07-02 | Discharge: 2017-07-02 | Disposition: A | Discharge status: Home / Self Care | Payer: BLUE CROSS/BLUE SHIELD | Source: Ambulatory Visit | Attending: Internal Medicine | Admitting: Internal Medicine

## 2017-07-02 VITALS — BP 134/79 | HR 99 | Temp 97.4°F | Resp 16 | Wt 124.4 lb

## 2017-07-02 DIAGNOSIS — Z9011 Acquired absence of right breast and nipple: Secondary | ICD-10-CM

## 2017-07-02 DIAGNOSIS — R197 Diarrhea, unspecified: Secondary | ICD-10-CM

## 2017-07-02 DIAGNOSIS — R0602 Shortness of breath: Secondary | ICD-10-CM | POA: Insufficient documentation

## 2017-07-02 DIAGNOSIS — J90 Pleural effusion, not elsewhere classified: Secondary | ICD-10-CM

## 2017-07-02 DIAGNOSIS — G893 Neoplasm related pain (acute) (chronic): Secondary | ICD-10-CM | POA: Diagnosis not present

## 2017-07-02 DIAGNOSIS — C50511 Malignant neoplasm of lower-outer quadrant of right female breast: Secondary | ICD-10-CM | POA: Diagnosis present

## 2017-07-02 DIAGNOSIS — Z171 Estrogen receptor negative status [ER-]: Secondary | ICD-10-CM

## 2017-07-02 DIAGNOSIS — Z95828 Presence of other vascular implants and grafts: Secondary | ICD-10-CM

## 2017-07-02 DIAGNOSIS — Z79899 Other long term (current) drug therapy: Secondary | ICD-10-CM | POA: Diagnosis not present

## 2017-07-02 DIAGNOSIS — R918 Other nonspecific abnormal finding of lung field: Secondary | ICD-10-CM | POA: Insufficient documentation

## 2017-07-02 LAB — CBC WITH DIFFERENTIAL/PLATELET
Basophils Absolute: 0 K/uL (ref 0–0.1)
Basophils Relative: 0 %
Eosinophils Absolute: 0 K/uL (ref 0–0.7)
Eosinophils Relative: 0 %
HCT: 37.9 % (ref 35.0–47.0)
Hemoglobin: 12.7 g/dL (ref 12.0–16.0)
Lymphocytes Relative: 7 %
Lymphs Abs: 0.4 K/uL — ABNORMAL LOW (ref 1.0–3.6)
MCH: 28.5 pg (ref 26.0–34.0)
MCHC: 33.5 g/dL (ref 32.0–36.0)
MCV: 85.1 fL (ref 80.0–100.0)
Monocytes Absolute: 0.3 K/uL (ref 0.2–0.9)
Monocytes Relative: 5 %
Neutro Abs: 5.2 K/uL (ref 1.4–6.5)
Neutrophils Relative %: 88 %
Platelets: 352 K/uL (ref 150–440)
RBC: 4.45 MIL/uL (ref 3.80–5.20)
RDW: 14.7 % — ABNORMAL HIGH (ref 11.5–14.5)
WBC: 5.9 K/uL (ref 3.6–11.0)

## 2017-07-02 LAB — COMPREHENSIVE METABOLIC PANEL WITH GFR
ALT: 18 U/L (ref 14–54)
AST: 38 U/L (ref 15–41)
Albumin: 2.9 g/dL — ABNORMAL LOW (ref 3.5–5.0)
Alkaline Phosphatase: 102 U/L (ref 38–126)
Anion gap: 11 (ref 5–15)
BUN: 12 mg/dL (ref 6–20)
CO2: 25 mmol/L (ref 22–32)
Calcium: 8.6 mg/dL — ABNORMAL LOW (ref 8.9–10.3)
Chloride: 97 mmol/L — ABNORMAL LOW (ref 101–111)
Creatinine, Ser: 0.56 mg/dL (ref 0.44–1.00)
GFR calc Af Amer: >60 mL/min
GFR calc non Af Amer: >60 mL/min
Glucose, Bld: 126 mg/dL — ABNORMAL HIGH (ref 65–99)
Potassium: 3.4 mmol/L — ABNORMAL LOW (ref 3.5–5.1)
Sodium: 133 mmol/L — ABNORMAL LOW (ref 135–145)
Total Bilirubin: 0.4 mg/dL (ref 0.3–1.2)
Total Protein: 7 g/dL (ref 6.5–8.1)

## 2017-07-02 MED ORDER — HEPARIN SOD (PORK) LOCK FLUSH 100 UNIT/ML IV SOLN
500.0000 [IU] | INTRAVENOUS | Status: AC | PRN
  Administered 2017-07-02: 500 [IU]

## 2017-07-02 MED ORDER — SODIUM CHLORIDE 0.9% FLUSH
10.0000 mL | INTRAVENOUS | Status: AC | PRN
  Administered 2017-07-02: 10 mL
  Filled 2017-07-02: qty 10

## 2017-07-02 NOTE — Assessment & Plan Note (Addendum)
#   STAGE IV RECURRENT breast cancer/ TRIPLE NEGATIVE-status post multiple lines of therapy ; Currently on DOXIL s/p cycle #1 on 7th March [[facility]]  #Clinically too early to assess response to current therapy; we will plan to proceed with cycle #2 in [place] on April 2nd.  #Worsening shortness of breath-multifactorial-progressive disease/right-sided pleural effusion; clinically less likely PE. chest x-ray reviewed-shows right-sided pleural effusion; consolidation [clinically not pneumonia likely progressive malignancy]; recommend ultrasound-guided paracentesis; patient prefers on 3/15.  Will schedule.  #Pain second malignancy-currently stable;  MS contin 30 am; 45 noon; 30 pm; oxycodone 2-3 day for BTP.  Patient will call for any refills.  # Diarrhea-2-3 loose stools a day unclear etiology rule out infectious disease especially C. Difficile.   #Given the advanced disease/decreased mobility recommend-handicap sticker  # plan Doxil on pairl 2nd/[place]; labs; [place] on 3/15; no labs; handicapped sticker.

## 2017-07-02 NOTE — Progress Notes (Signed)
[address] OFFICE PROGRESS NOTE  Patient Care Team: Copland, [name], MD (General Surgery) Copland, Ginette Otto as Referring Physician (Physician Assistant) [name], MD (Oncology)  Cancer Staging No matching staging information was found for the patient.   Oncology History   # DEC 2015-  Carcinoma of right breast T1 be N1 M0 tumor stage II diagnosis in December of 2015 (right lower and outer quadrant). Needle biopsy positive of the right, Axillary lymph node (December, 2015), positive for metastatic breast carcinoma. Estrogen receptor negative.  Progesterone receptor weakly positive.  HER-2/neu receptor negative. # JAN 2016- NEO-ADJ CHEMO- Cytoxan, Adriamycin,  followed by Taxol (neoadjuvant therapy) April 25, 2014., 3.finished 4 cycles of chemotherapy with Cytoxan and Adriamycin on 7 th  March, 2016 4.started Taxol on a weekly basis from March 29 5.  Patient will finish weekly Taxol on 13th of June, 2016 6.  Patient had lumpectomy and axillary node evaluation. ypT1b [37m]  ypN1 c (3 /12LN)MO [Dr.Byrnett].  (July, 2016) s/p RT.   # MAY 2018- RECURRENT/ IPSILATERAL BREAST- may 2018- PET- ipsilateral & contralateral axillary/supraclavicular; right breast thickening.   [name]; poor response; [name]; [name]; [name]  # 01/28/2017- ERIBULIN- progression  # NOV 2018- Sacituzumab/clinical trial [[facility]; Dr.Trevor] Progression  # March 7th Doxil #1 [[facility]]  # Right Pleural effusion [s/p thora]    -------------------------------------------------------------------------- # [facility]- No targets**  7.  Genetic mutation.  No clinically significant mutation identified (December, 2015)  # MELANOMA left neck [Dr.Graham- 2011]     Carcinoma of lower-outer quadrant of right breast in [place],  [place]     INTERVAL HISTORY:  Kiara [id number].o.  female  the above history of recurrent/ metastatic triple negative breast cancer status post multiple lines of therapy-most recently [facility]is here for further evaluation given her worsening shortness of breath.  Patient had right-sided thoracentesis x2 over the last 2-4 weeks.  Shortness of breath is progressive.  No worsening cough.  This is worse on exertion.  Patient also complains of 2-3 loose stools a day for the last 2-3 weeks [even prior to starting Doxil].  Denies any abdominal cramping.  Denies abdominal pain.   Patient continues to have pain in her shoulders back anteriorly/chest wall-from malignancy.  She continues to be on long-acting MS Contin 3 times a day/oxycodone as needed.  Poor appetite.  Otherwise no nausea vomiting.  REVIEW OF SYSTEMS:  A complete 10 point review of system is done which is negative except mentioned above/history of present illness.   PAST MEDICAL HISTORY :  Past Medical History:  Diagnosis Date  . Anemia   . BRCA negative 03/2014   [facility]  . [place] [place]   Right, T1b, N1 prYpT1b,N1a, ER neg, PR < 10 %, Her 2 neu not overexpressed.   . Cancer ([facility] 03-24-14   Right breast, microcalcifications. ER negative, PR less than 10%, HER-2/neu not amplified.  . [facility]an[facility] ([facility] Sept 2011   Left neck T1a, 0.35 mm[facility]  . Personal history of chemotherapy   . PONV (postoperative nausea and vomiting)    WITH 1ST PORT PLACEMENT ONLY    PAST SURGICAL HISTORY :   Past Surgical History:  Procedure  Laterality Date  . AXILLARY LYMPH NODE BIOPSY Left 12/25/2016   Procedure: AXILLARY LYMPH NODE BIOPSY;  Surgeon: Robert Bellow, MD;  Location: [facility] ORS;  Service: General;  Laterality: Left;  . BREAST BIOPSY Right 03-24-14   +  . BREAST EXCISIONAL  BIOPSY Right 10/28/2014   Lumpectomy +  . BREAST LUMPECTOMY WITH SENTINEL LYMPH NODE BIOPSY Right 10/28/2014   Procedure: right breast wide excision with sentinel node biopsy, mastoplasty, axillary dissection ;  Surgeon: Robert Bellow, MD;  Location: [facility] ORS;  Service: General;  Laterality: Right;  . BREAST RECONSTRUCTION Right 12/25/2016   Procedure: BREAST RECONSTRUCTION;  Surgeon: Wallace Going, DO;  Location: [facility] ORS;  Service: Plastics;  Laterality: Right;  . INCISIONAL BREAST BIOPSY Right 09/03/2016   Punch biopsy, invasive mammary carcinoma, Triple negative.   Marland Kitchen LATISSIMUS FLAP TO BREAST Right 12/25/2016   Procedure: LATISSIMUS FLAP TO BREAST;  Surgeon: Wallace Going, DO;  Location: [facility] ORS;  Service: Plastics;  Laterality: Right;  Right Latissimus myocutaneous flap with rotational flap from abdomen  . MASTECTOMY WITH AXILLARY LYMPH NODE DISSECTION Right 12/25/2016   Procedure: MASTECTOMY WITH AXILLARY LYMPH NODE DISSECTION;  Surgeon: Robert Bellow, MD;  Location: [facility] ORS;  Service: General;  Laterality: Right;  . MELANOMA EXCISION  Nov 2011   neck  . PORT-A-CATH REMOVAL  2017  . [facility] PLACEMENT  Dec 2015  . PORTACATH PLACEMENT N/A 09/24/2016   Procedure: INSERTION PORT-A-CATH;  Surgeon: Robert Bellow, MD;  Location: [facility] ORS;  Service: General;  Laterality: N/A;  . SIMPLE MASTECTOMY WITH AXILLARY SENTINEL NODE BIOPSY Right 12/25/2016   Procedure: SIMPLE MASTECTOMY;  Surgeon: Robert Bellow, MD;  Location: [facility] ORS;  Service: General;  Laterality: Right;    FAMILY HISTORY :   Family History  Problem Relation Age of Onset  . Cancer Mother        lung ? mid 83    SOCIAL HISTORY:   Social History   Tobacco Use  . Smoking status: Never Smoker  . Smokeless tobacco: Never Used  Substance Use Topics  . Alcohol use: No    Alcohol/week: 0.0 oz  . Drug use: No    ALLERGIES:  is allergic to hydrocodone and sulfa antibiotics.  MEDICATIONS:  Current  Outpatient Medications  Medication Sig Dispense Refill  . lidocaine-prilocaine (EMLA) cream     . loperamide (IMODIUM A-D) 2 MG tablet Take 2 mg by mouth.    . morphine (MS CONTIN) 15 MG 12 hr tablet Take 1 tablet (15 mg total) by mouth every 12 (twelve) hours. 30 tablet 0  . ondansetron (ZOFRAN) 8 MG tablet Take 8 mg by mouth every 8 (eight) hours as needed for nausea or vomiting.    Marland Kitchen oxyCODONE (OXY IR/ROXICODONE) 5 MG immediate release tablet Take 1 tablet (5 mg total) by mouth every 4 (four) hours as needed for moderate pain. 30 tablet 0  . diazepam (VALIUM) 2 MG tablet Take 2 mg by mouth as needed.  0  . loratadine (CLARITIN) 10 MG tablet Take 10 mg by mouth daily as needed (after onpro injections).    . Misc Natural Products (DANDELION ROOT PO) Take 1 tablet by mouth 2 (two) times daily.     . Turmeric 500 MG CAPS Take 500 mg by mouth 2 (two) times daily.     No current facility-administered medications for this visit.    Facility-Administered Medications Ordered in Other Visits  Medication Dose Route Frequency Provider Last Rate Last  Dose  . sodium chloride 0.9 % injection 10 mL  10 mL Intravenous PRN Evlyn Kanner, NP   10 mL at 11/08/14 1207    PHYSICAL EXAMINATION: ECOG PERFORMANCE STATUS: 0 - Asymptomatic  BP 134/79 (BP Location: Left Arm, Patient Position: Sitting)   Pulse 99   Temp (!) 97.4 F (36.3 C) (Tympanic)   Resp 16   Wt 124 lb 6.4 oz (56.4 kg)   BMI 21.35 kg/m   Filed Weights   07/02/17 1019  Weight: 124 lb 6.4 oz (56.4 kg)    GENERAL: Well-nourished well-developed; Alert, no distress and comfortable.   With her husband.  She is in a wheelchair.  Temporal wasting noted EYES: no pallor or icterus OROPHARYNX: no thrush or ulceration; good dentition  NECK: supple, no masses felt LYMPH:  no palpable lymphadenopathy in the cervical Or inguinal region.  Bilateral lymphadenopathy noted.  LUNGS: clear to auscultation and  No wheeze or crackles HEART/CVS:  regular rate & rhythm and no murmurs; No lower extremity edema ABDOMEN:abdomen soft, non-tender and normal bowel sounds Musculoskeletal:no cyanosis of digits and no clubbing; bilateral lymphedema noted left more than right PSYCH: alert & oriented x 3 with fluent speech NEURO: no focal motor/sensory deficits SKIN: Cutaneous metastasis noted anteriorly chest; posteriorly in the right back   LABORATORY DATA:  I have reviewed the data as listed    Component Value Date/Time   NA 133 (L) 07/02/2017 0948   NA 138 08/16/2014 1332   K 3.4 (L) 07/02/2017 0948   K 3.7 08/16/2014 1332   CL 97 (L) 07/02/2017 0948   CL 106 08/16/2014 1332   CO2 25 07/02/2017 0948   CO2 27 08/16/2014 1332   GLUCOSE 126 (H) 07/02/2017 0948   GLUCOSE 118 (H) 08/16/2014 1332   BUN 12 07/02/2017 0948   BUN 9 08/16/2014 1332   CREATININE 0.56 07/02/2017 0948   CREATININE 0.71 08/16/2014 1332   CALCIUM 8.6 (L) 07/02/2017 0948   CALCIUM 9.0 08/16/2014 1332   PROT 7.0 07/02/2017 0948   PROT 7.2 08/16/2014 1332   ALBUMIN 2.9 (L) 07/02/2017 0948   ALBUMIN 4.4 08/16/2014 1332   AST 38 07/02/2017 0948   AST 28 08/16/2014 1332   ALT 18 07/02/2017 0948   ALT 31 08/16/2014 1332   ALKPHOS 102 07/02/2017 0948   ALKPHOS 45 08/16/2014 1332   BILITOT 0.4 07/02/2017 0948   BILITOT 0.4 08/16/2014 1332   GFRNONAA >60 07/02/2017 0948   GFRNONAA >60 08/16/2014 1332   GFRAA >60 07/02/2017 0948   GFRAA >60 08/16/2014 1332    No results found for: SPEP, UPEP  Lab Results  Component Value Date   WBC 5.9 07/02/2017   NEUTROABS 5.2 07/02/2017   HGB 12.7 07/02/2017   HCT 37.9 07/02/2017   MCV 85.1 07/02/2017   PLT 352 07/02/2017      Chemistry      Component Value Date/Time   NA 133 (L) 07/02/2017 0948   NA 138 08/16/2014 1332   K 3.4 (L) 07/02/2017 0948   K 3.7 08/16/2014 1332   CL 97 (L) 07/02/2017 0948   CL 106 08/16/2014 1332   CO2 25 07/02/2017 0948   CO2 27 08/16/2014 1332   BUN 12 07/02/2017 0948   BUN 9  08/16/2014 1332   CREATININE 0.56 07/02/2017 0948   CREATININE 0.71 08/16/2014 1332   01-28-2017    Component Value Date/Time   CALCIUM 8.6 (L) 07/02/2017 0948   CALCIUM 9.0 08/16/2014 1332  ALKPHOS 102 07/02/2017 0948   ALKPHOS 45 08/16/2014 1332   AST 38 07/02/2017 0948   AST 28 08/16/2014 1332   ALT 18 07/02/2017 0948   ALT 31 08/16/2014 1332   BILITOT 0.4 07/02/2017 0948   BILITOT 0.4 08/16/2014 1332       Dg Chest 2 View  Result Date: 07/02/2017 CLINICAL DATA:  Shortness of breath.  Right-sided breast carcinoma EXAM: CHEST - 2 VIEW COMPARISON:  PET-CT January 27, 2017; chest radiograph September 24, 2016 FINDINGS: There is asymmetric pleural thickening in the right apex region with concern for mass arising in the right apex. There is consolidation in the right middle lobe and portions of the right lower lobe with right pleural effusion. There is a small left pleural effusion. There are multiple subcentimeter nodular opacities throughout both upper lobes. Heart size and pulmonary vascularity are normal. No adenopathy demonstrable. Port-A-Cath tip in superior vena cava. No pneumothorax. No blastic or lytic bone lesions. IMPRESSION: 1. Right pleural effusion with consolidation right middle lobe and portions of right lower lobe. Small left pleural effusion. 2. Asymmetric pleural thickening right apex with concern for mass arising from the right apex. Opacity in this area measures 5.0 x 3.5 cm. 3. Multiple subcentimeter nodular opacities throughout both upper lobes, concerning for small metastatic foci. 4.   Port-A-Cath tip in superior vena cava. Electronically Signed   By: Lowella Grip III M.D.   On: 07/02/2017 08:58     ASSESSMENT & PLAN:  Carcinoma of lower-outer quadrant of right breast in female, estrogen receptor negative ([place]) # STAGE IV RECURRENT breast cancer/ TRIPLE NEGATIVE-status post multiple lines of therapy ; Currently on DOXIL s/p cycle #1 on 7th March [[facility]]  #Clinically  too early to assess response to current therapy; we will plan to proceed with cycle #2 in [place] on April 2nd.  #Worsening shortness of breath-multifactorial-progressive disease/right-sided pleural effusion; clinically less likely PE. chest x-ray reviewed-shows right-sided pleural effusion; consolidation [clinically not pneumonia likely progressive malignancy]; recommend ultrasound-guided paracentesis; patient prefers on 3/15.  Will schedule.  #Pain second malignancy-currently stable;  MS contin 30 am; 45 noon; 30 pm; oxycodone 2-3 day for BTP.  Patient will call for any refills.  # Diarrhea-2-3 loose stools a day unclear etiology rule out infectious disease especially C. Difficile.   #Given the advanced disease/decreased mobility recommend-handicap sticker  # plan Doxil on pairl 2nd/[place]; labs; thora on 3/15; no labs; handicapped sticker.    Orders Placed This Encounter  Procedures  . C difficile quick screen w PCR reflex    Standing Status:   Future    Standing Expiration Date:   07/03/2018    Order Specific Question:   Is your patient experiencing loose or watery stools (3 or more in 24 hours)?    Answer:   Yes    Order Specific Question:   Has the patient received laxatives in the last 24 hours?    Answer:   No    Order Specific Question:   Has a negative Cdiff test resulted in the last 7 days?    Answer:   No  . US THORACENTESIS ASP PLEURAL SPACE W/IMG GUIDE    Right lung    Standing Status:   Future    Standing Expiration Date:   09/02/2018    Order Specific Question:   Are labs required for specimen collection?    Answer:   No    Order Specific Question:   Reason for Exam (SYMPTOM  OR DIAGNOSIS  REQUIRED)    Answer:   Right pleural effusion    Order Specific Question:   Preferred imaging location?    Answer:   [facility]   All questions were answered. The patient knows to call the clinic with any problems, questions or concerns.      Cammie Sickle,  MD 07/03/2017 7:08 AM

## 2017-07-04 ENCOUNTER — Ambulatory Visit
Admission: RE | Admit: 2017-07-04 | Discharge: 2017-07-04 | Disposition: A | Discharge status: Home / Self Care | Payer: BLUE CROSS/BLUE SHIELD | Source: Ambulatory Visit | Attending: Internal Medicine | Admitting: Internal Medicine

## 2017-07-04 ENCOUNTER — Ambulatory Visit
Admission: RE | Admit: 2017-07-04 | Discharge: 2017-07-04 | Disposition: A | Discharge status: Home / Self Care | Payer: BLUE CROSS/BLUE SHIELD | Source: Ambulatory Visit | Attending: Physician Assistant | Admitting: Physician Assistant

## 2017-07-04 DIAGNOSIS — J90 Pleural effusion, not elsewhere classified: Secondary | ICD-10-CM | POA: Diagnosis present

## 2017-07-04 DIAGNOSIS — R918 Other nonspecific abnormal finding of lung field: Secondary | ICD-10-CM | POA: Diagnosis not present

## 2017-07-04 NOTE — Procedures (Signed)
PROCEDURE SUMMARY:  Successful US guided right thoracentesis.  Kiara Grant was used due to significant scar tissue all along the right chest wall.  800 mL of clear yellow fluid was removed. Procedure was stopped due to patient request. She states she feels better if only 800 mL removed. Any more than that and she has constant coughing with chest pain and tightness.  Pt tolerated procedure well.  No immediate complications.  Post procedure chest X-ray reveals no pneumothorax  Tyliah Schlereth S Jaymarie Yeakel PA-C 07/04/2017 11:15 AM

## 2017-07-08 ENCOUNTER — Telehealth: Payer: Self-pay | Admitting: *Deleted

## 2017-07-08 ENCOUNTER — Telehealth: Payer: Self-pay | Admitting: Internal Medicine

## 2017-07-08 ENCOUNTER — Inpatient Hospital Stay: Payer: BLUE CROSS/BLUE SHIELD

## 2017-07-08 DIAGNOSIS — C50511 Malignant neoplasm of lower-outer quadrant of right female breast: Secondary | ICD-10-CM | POA: Diagnosis not present

## 2017-07-08 DIAGNOSIS — R197 Diarrhea, unspecified: Secondary | ICD-10-CM

## 2017-07-08 LAB — C DIFFICILE QUICK SCREEN W PCR REFLEX
C Diff antigen: NEGATIVE
C Diff interpretation: NOT DETECTED
C Diff toxin: NEGATIVE

## 2017-07-08 NOTE — Telephone Encounter (Signed)
-----   Message from Secundino Ginger sent at 07/08/2017 11:38 AM EDT ----- Contact: [place] and wants Dr Rogue Bussing to call her. Just FYI her husband came in this morning and brought her stool sample and talked to me about Hospice. He felt like it was time but didn't want to initiate it. He wanted her to do it. This may be about Hospice care.

## 2017-07-08 NOTE — Telephone Encounter (Signed)
Spoke with patient. Results provided. At this time she does not need a prescription for lomotil

## 2017-07-08 NOTE — Telephone Encounter (Signed)
She was scheduled for lab/ md/ doxil on 4/2. I cancelled it per my conversation with Heather. Should I put it back on schedule?

## 2017-07-08 NOTE — Progress Notes (Signed)
labs abnormal.-  Please inform patient that C.diff was negative; likely diarrhea- sec to chemo; recommend Imodium; if patient is interested/needs please prescribe Lomotil.  Thx

## 2017-07-08 NOTE — Telephone Encounter (Signed)
-----   Message from Secundino Ginger sent at 07/08/2017 11:38 AM EDT ----- Contact: [place] and wants Dr Rogue Bussing to call her. Just FYI her husband came in this morning and brought her stool sample and talked to me about Hospice. He felt like it was time but didn't want to initiate it. He wanted her to do it. This may be about Hospice care.

## 2017-07-08 NOTE — Telephone Encounter (Signed)
-----   Message from Cammie Sickle, MD sent at 07/08/2017  1:19 PM EDT ----- labs abnormal.-  Please inform patient that C.diff was negative; likely diarrhea- sec to chemo; recommend Imodium; if patient is interested/needs please prescribe Lomotil.  Thx

## 2017-07-08 NOTE — Telephone Encounter (Signed)
Per md- please have patient come in this afternoon at 2 pm in [facility] for further discussion

## 2017-07-08 NOTE — Telephone Encounter (Signed)
Yes, keep all f/us and chemo the same. Patient will have continue to have chemotherapy

## 2017-07-08 NOTE — Telephone Encounter (Signed)
Spoke to patient; she wants disability papers signed.  I think is reasonable.  With regards to future medical care-discussed regarding palliative care/hospice; my preference would be is initiate palliative care evaluation/referral ASAP; hold off hospice at this time.  Continue Doxil as planned.   #Please follow-up on stool samples/testing when available.   #No new appointments needed at this time; patient agrees.

## 2017-07-10 ENCOUNTER — Other Ambulatory Visit: Payer: Self-pay | Admitting: *Deleted

## 2017-07-10 ENCOUNTER — Encounter: Payer: Self-pay | Admitting: Internal Medicine

## 2017-07-10 DIAGNOSIS — J90 Pleural effusion, not elsewhere classified: Secondary | ICD-10-CM

## 2017-07-10 DIAGNOSIS — R0602 Shortness of breath: Secondary | ICD-10-CM

## 2017-07-10 NOTE — Telephone Encounter (Signed)
I spoke with Kiara Grant in scheduling. They are unsure if they can add the patient for either tom. Due to tight schedule in IR.  However, I entered the orders for a thoracentesis per your request and faxed the information sheet. The pleurx cath are only sch. For Friday and the dept most likely are unable to accommodate the pleurx cath.  I did update the patient. She is ok with proceeding with a thoracentesis and potentially scheduling a pleurx cath the next time she needs to be scheduled.  I can enter the orders for the pleurx as well and see what happens from a scheduling point of view.

## 2017-07-10 NOTE — Telephone Encounter (Signed)
Lauren spoke with Dr. Jacinto Reap and Georgeanne Nim, NP and they recommend thoracentesis or pleurex cath placement. We can skip chest x-ray at this point, if patient is symptomatic. She recently had chest x-ray.   Faythe Casa, NP 07/10/2017 4:18 PM

## 2017-07-10 NOTE — Telephone Encounter (Signed)
Jenny, please advise.

## 2017-07-11 ENCOUNTER — Telehealth: Payer: Self-pay | Admitting: *Deleted

## 2017-07-11 ENCOUNTER — Ambulatory Visit
Admission: RE | Admit: 2017-07-11 | Discharge: 2017-07-11 | Disposition: A | Discharge status: Home / Self Care | Payer: BLUE CROSS/BLUE SHIELD | Source: Ambulatory Visit | Attending: Oncology | Admitting: Oncology

## 2017-07-11 DIAGNOSIS — R0602 Shortness of breath: Secondary | ICD-10-CM | POA: Insufficient documentation

## 2017-07-11 DIAGNOSIS — J910 Malignant pleural effusion: Secondary | ICD-10-CM | POA: Insufficient documentation

## 2017-07-11 DIAGNOSIS — J90 Pleural effusion, not elsewhere classified: Secondary | ICD-10-CM

## 2017-07-11 NOTE — Telephone Encounter (Signed)
Thanks so much! We will wait and she what we can get done today, if anything.

## 2017-07-11 NOTE — Telephone Encounter (Signed)
Thanks so much.  Heather

## 2017-07-11 NOTE — Telephone Encounter (Signed)
Patient will come at 1230 pm today for thoracentesis.  I have asked for pleurx cath to be scheduled today if possible. If not, I have asked scheduling to consider sch. This for next Friday??? Appears patient has been requiring weekly pleural drainage.

## 2017-07-11 NOTE — Telephone Encounter (Signed)
contacted patient for US thoracentesis time. Patient can either come at 900 or 1230 pm.  Pt prefers that later time as she lives in [place]. She gave verbal understanding of the plan of care.    I spoke with Pamala Hurry in scheduling. If IR can not accommodate the pleurx cath today when performing the thoracentesis, we could possibly schedule the pleurx cath in 1 weeks time.

## 2017-07-12 NOTE — Procedures (Signed)
PROCEDURE SUMMARY:  Successful US guided right thoracentesis. Yielded 800 mL of clear yellow fluid. Patient requestd to stop at 800 mL Pt tolerated procedure well. No immediate complications.  Post procedure chest X-ray not done due to patient request.  [facility] S Tannon Peerson PA-C 07/12/2017 2:26 PM

## 2017-07-15 ENCOUNTER — Telehealth: Payer: Self-pay | Admitting: *Deleted

## 2017-07-15 ENCOUNTER — Encounter: Payer: Self-pay | Admitting: Internal Medicine

## 2017-07-15 DIAGNOSIS — J90 Pleural effusion, not elsewhere classified: Secondary | ICD-10-CM

## 2017-07-15 DIAGNOSIS — R0602 Shortness of breath: Secondary | ICD-10-CM

## 2017-07-15 NOTE — Telephone Encounter (Signed)
Spoke with patient to f/u on her my chart msg. Patient c/o lower extremity edema x 3-4 days. Per patient, the edema is isolated only in the ankles bilaterally. She has not been elevating the legs; however, she has been on her feet more yesterday. Patient instructed to keep her feet elevated.  Per md- patient may get a chest xray on Thursday. Will tentatively plan a thoracentesis on Friday. - cxr order entered  Pt gave verbal understanding of the plan of care.

## 2017-07-17 ENCOUNTER — Ambulatory Visit
Admission: RE | Admit: 2017-07-17 | Discharge: 2017-07-17 | Disposition: A | Discharge status: Home / Self Care | Payer: BLUE CROSS/BLUE SHIELD | Source: Ambulatory Visit | Attending: Internal Medicine | Admitting: Internal Medicine

## 2017-07-17 DIAGNOSIS — R0602 Shortness of breath: Secondary | ICD-10-CM | POA: Diagnosis present

## 2017-07-17 DIAGNOSIS — J90 Pleural effusion, not elsewhere classified: Secondary | ICD-10-CM | POA: Insufficient documentation

## 2017-07-18 ENCOUNTER — Ambulatory Visit
Admission: RE | Admit: 2017-07-18 | Discharge: 2017-07-18 | Disposition: A | Discharge status: Home / Self Care | Payer: BLUE CROSS/BLUE SHIELD | Source: Ambulatory Visit | Attending: Internal Medicine | Admitting: Internal Medicine

## 2017-07-18 ENCOUNTER — Telehealth: Payer: Self-pay | Admitting: *Deleted

## 2017-07-18 ENCOUNTER — Other Ambulatory Visit: Payer: Self-pay | Admitting: *Deleted

## 2017-07-18 ENCOUNTER — Ambulatory Visit
Admission: RE | Admit: 2017-07-18 | Discharge: 2017-07-18 | Disposition: A | Discharge status: Home / Self Care | Payer: BLUE CROSS/BLUE SHIELD | Source: Ambulatory Visit | Attending: Radiology | Admitting: Radiology

## 2017-07-18 DIAGNOSIS — R0602 Shortness of breath: Secondary | ICD-10-CM | POA: Diagnosis not present

## 2017-07-18 DIAGNOSIS — J90 Pleural effusion, not elsewhere classified: Secondary | ICD-10-CM | POA: Diagnosis present

## 2017-07-18 DIAGNOSIS — Z9889 Other specified postprocedural states: Secondary | ICD-10-CM | POA: Diagnosis not present

## 2017-07-18 NOTE — Procedures (Signed)
Ultrasound-guided  therapeutic right thoracentesis performed yielding 800 cc (maximum tolerated/requested by pt) of yellow fluid. No immediate complications. Follow-up chest x-ray pending.

## 2017-07-18 NOTE — Telephone Encounter (Signed)
Pleurx cath forms completed and awaiting Dr. Sharmaine Base signature.

## 2017-07-18 NOTE — Telephone Encounter (Signed)
Pleurx cath forms have been faxed.

## 2017-07-18 NOTE — Telephone Encounter (Signed)
Patient needs pleurx cath forms completed and faxed to [facility]

## 2017-07-22 ENCOUNTER — Inpatient Hospital Stay: Payer: BLUE CROSS/BLUE SHIELD

## 2017-07-22 ENCOUNTER — Ambulatory Visit: Payer: BLUE CROSS/BLUE SHIELD

## 2017-07-22 ENCOUNTER — Other Ambulatory Visit: Payer: BLUE CROSS/BLUE SHIELD

## 2017-07-22 ENCOUNTER — Other Ambulatory Visit: Payer: Self-pay

## 2017-07-22 ENCOUNTER — Inpatient Hospital Stay: Payer: BLUE CROSS/BLUE SHIELD | Attending: Internal Medicine | Admitting: Internal Medicine

## 2017-07-22 ENCOUNTER — Ambulatory Visit: Payer: BLUE CROSS/BLUE SHIELD | Admitting: Internal Medicine

## 2017-07-22 ENCOUNTER — Telehealth: Payer: Self-pay | Admitting: *Deleted

## 2017-07-22 VITALS — BP 124/82 | HR 108 | Temp 96.4°F | Resp 18 | Wt 122.5 lb

## 2017-07-22 DIAGNOSIS — Z9011 Acquired absence of right breast and nipple: Secondary | ICD-10-CM | POA: Diagnosis not present

## 2017-07-22 DIAGNOSIS — C50511 Malignant neoplasm of lower-outer quadrant of right female breast: Secondary | ICD-10-CM | POA: Diagnosis not present

## 2017-07-22 DIAGNOSIS — Z79899 Other long term (current) drug therapy: Secondary | ICD-10-CM | POA: Diagnosis not present

## 2017-07-22 DIAGNOSIS — R0902 Hypoxemia: Secondary | ICD-10-CM | POA: Insufficient documentation

## 2017-07-22 DIAGNOSIS — Z9221 Personal history of antineoplastic chemotherapy: Secondary | ICD-10-CM | POA: Insufficient documentation

## 2017-07-22 DIAGNOSIS — J90 Pleural effusion, not elsewhere classified: Secondary | ICD-10-CM | POA: Diagnosis not present

## 2017-07-22 DIAGNOSIS — R0602 Shortness of breath: Secondary | ICD-10-CM | POA: Insufficient documentation

## 2017-07-22 DIAGNOSIS — G893 Neoplasm related pain (acute) (chronic): Secondary | ICD-10-CM | POA: Insufficient documentation

## 2017-07-22 DIAGNOSIS — Z171 Estrogen receptor negative status [ER-]: Secondary | ICD-10-CM | POA: Diagnosis not present

## 2017-07-22 DIAGNOSIS — R197 Diarrhea, unspecified: Secondary | ICD-10-CM | POA: Insufficient documentation

## 2017-07-22 LAB — CBC WITH DIFFERENTIAL/PLATELET
BASOS PCT: 0 %
Basophils Absolute: 0 10*3/uL (ref 0–0.1)
EOS ABS: 0 10*3/uL (ref 0–0.7)
EOS PCT: 0 %
HCT: 38.9 % (ref 35.0–47.0)
HEMOGLOBIN: 13.1 g/dL (ref 12.0–16.0)
Lymphocytes Relative: 8 %
Lymphs Abs: 0.5 10*3/uL — ABNORMAL LOW (ref 1.0–3.6)
MCH: 27.9 pg (ref 26.0–34.0)
MCHC: 33.6 g/dL (ref 32.0–36.0)
MCV: 83.3 fL (ref 80.0–100.0)
Monocytes Absolute: 0.7 10*3/uL (ref 0.2–0.9)
Monocytes Relative: 10 %
NEUTROS PCT: 82 %
Neutro Abs: 5.2 10*3/uL (ref 1.4–6.5)
PLATELETS: 363 10*3/uL (ref 150–440)
RBC: 4.67 MIL/uL (ref 3.80–5.20)
RDW: 15.8 % — ABNORMAL HIGH (ref 11.5–14.5)
WBC: 6.4 10*3/uL (ref 3.6–11.0)

## 2017-07-22 LAB — COMPREHENSIVE METABOLIC PANEL
ALBUMIN: 2.8 g/dL — AB (ref 3.5–5.0)
ALK PHOS: 185 U/L — AB (ref 38–126)
ALT: 20 U/L (ref 14–54)
ANION GAP: 10 (ref 5–15)
AST: 47 U/L — ABNORMAL HIGH (ref 15–41)
BUN: 12 mg/dL (ref 6–20)
CALCIUM: 8.1 mg/dL — AB (ref 8.9–10.3)
CHLORIDE: 95 mmol/L — AB (ref 101–111)
CO2: 24 mmol/L (ref 22–32)
Creatinine, Ser: 0.59 mg/dL (ref 0.44–1.00)
GFR calc Af Amer: >60 mL/min (ref >60)
GFR calc non Af Amer: >60 mL/min (ref >60)
GLUCOSE: 98 mg/dL (ref 65–99)
Potassium: 3.7 mmol/L (ref 3.5–5.1)
SODIUM: 129 mmol/L — AB (ref 135–145)
Total Bilirubin: 0.5 mg/dL (ref 0.3–1.2)
Total Protein: 7 g/dL (ref 6.5–8.1)

## 2017-07-22 MED ORDER — OXYCODONE HCL 10 MG PO TABS: 10.0000 mg | ORAL_TABLET | ORAL | 0 refills | Status: AC | PRN

## 2017-07-22 MED ORDER — HYDROCOD POLST-CPM POLST ER 10-8 MG/5ML PO SUER: 5.0000 mL | Freq: Every evening | ORAL | 0 refills | Status: AC | PRN

## 2017-07-22 MED ORDER — HEPARIN SOD (PORK) LOCK FLUSH 100 UNIT/ML IV SOLN
500.0000 [IU] | Freq: Once | INTRAVENOUS | Status: AC
  Administered 2017-07-22: 500 [IU] via INTRAVENOUS

## 2017-07-22 MED ORDER — MORPHINE SULFATE ER 15 MG PO TBCR: EXTENDED_RELEASE_TABLET | ORAL | 0 refills | Status: AC

## 2017-07-22 MED ORDER — HEPARIN SOD (PORK) LOCK FLUSH 100 UNIT/ML IV SOLN
INTRAVENOUS | Status: AC
  Filled 2017-07-22: qty 5

## 2017-07-22 MED ORDER — MORPHINE SULFATE ER 30 MG PO TBCR
EXTENDED_RELEASE_TABLET | ORAL | 0 refills | Status: AC
Start: 2017-07-22 — End: ?

## 2017-07-22 MED ORDER — SODIUM CHLORIDE 0.9% FLUSH
10.0000 mL | INTRAVENOUS | Status: DC | PRN
  Administered 2017-07-22: 10 mL via INTRAVENOUS
  Filled 2017-07-22: qty 10

## 2017-07-22 NOTE — Progress Notes (Signed)
Here for follow up  Stated she needs renewal of MS Contin-taking  45 mg q8h, and Oxycodone 10 mg q 4 h.-per pt r per pt and husband she has been very SOB and only able to take approx 5-8 steps at a time w assistance.   02 sat measured @ 90%on RA-sitting and 88% on RA w only 5 steps taken - pt put on  02@ 2 L x 5 min-resting in chair  02 was 98% and walking approx 10 feet 02 dropped to 88 % w exertion  Pt noted to have unprroductive short coughing periods. Dr Yevette Edwards informed

## 2017-07-22 NOTE — Telephone Encounter (Signed)
Hospice order has been faxed.

## 2017-07-22 NOTE — Assessment & Plan Note (Addendum)
#  STAGE IV RECURRENT breast cancer/ TRIPLE NEGATIVE-status post multiple lines of therapy ; Currently on DOXIL s/p cycle #1 on 7th March [[facility]]  #Patient due for Doxil cycle #2 today.  However, progressive disease noted on clinical exam; and given patient's poor tolerance to therapy [diarrhea fatigue]-recommend discontinuation of further chemotherapy; I would recommend hospice.  Patient has previously met with palliative care.  #Worsening shortness of breath/cough-multifactorial-progressive disease/right-sided pleural effusion; s/p thora; awaiting pleurex cath placement on April 4.  Patient declines evaluation for Pleurx catheter placement  #Pain second malignancy-stable on current regimen with MS Contin 45 mg 3 times a day; continue oxycodone 10 mg every 4 hours as needed  # Diarrhea-1-2 loose stools; negative for infection/C. Difficile.  Continue Imodium as needed  # hypoxia-sec to pleural effusion/progressive malignancy; O2 saturation 88; recommend home O2  # Will refer the patient to hospice; follow-up as needed basis.

## 2017-07-22 NOTE — Progress Notes (Signed)
[place] OFFICE PROGRESS NOTE  Patient Care Team: Copland, [name], MD (General Surgery) Copland, Ginette Otto as Referring Physician (Physician Assistant) [name], MD (Oncology)  Cancer Staging No matching staging information was found for the patient.   Oncology History   # DEC 2015-  Carcinoma of right breast T1 be N1 M0 tumor stage II diagnosis in December of 2015 (right lower and outer quadrant). Needle biopsy positive of the right, Axillary lymph node (December, 2015), positive for metastatic breast carcinoma. Estrogen receptor negative.  Progesterone receptor weakly positive.  HER-2/neu receptor negative. # JAN 2016- NEO-ADJ CHEMO- Cytoxan, Adriamycin,  followed by Taxol (neoadjuvant therapy) April 25, 2014., 3.finished 4 cycles of chemotherapy with Cytoxan and Adriamycin on 7 th  March, 2016 4.started Taxol on a weekly basis from March 29 5.  Patient will finish weekly Taxol on 13th of June, 2016 6.  Patient had lumpectomy and axillary node evaluation. ypT1b [25m]  ypN1 c (3 /12LN)MO [Dr.Byrnett].  (July, 2016) s/p RT.   # MAY 2018- RECURRENT/ IPSILATERAL BREAST- may 2018- PET- ipsilateral & contralateral axillary/supraclavicular; right breast thickening.   [name]; poor response; [name]; [name]; [name]  # 01/28/2017- ERIBULIN- progression  # NOV 2018- Sacituzumab/clinical trial [[facility]; Dr.Trevor] Progression  # March 7th Doxil #1 [[facility]]  # Right Pleural effusion [s/p thora]    -------------------------------------------------------------------------- # [facility]- No targets**  7.  Genetic mutation.  No clinically significant mutation identified (December, 2015)  # MELANOMA left neck [Dr.Graham- 2011]     Carcinoma of lower-outer quadrant of right breast in female,  estrogen receptor negative ([facility]     INTERVAL HISTORY:  Kiara [id number].[id number].  female  the above history of recurrent/ metastatic triple negative breast cancer status post multiple lines of therapy-most recently [facility]is here for further evaluation for cycle #2 of Doxil chemotherapy today.  However patient has been feeling tired/fatigue poor appetite.  Complains of swelling of the bilateral ankles.  She also complains of diarrhea 1-2 loose stools a day.  Not been using Imodium given the concerns of constipation.  She continues to complain of progressive shortness of breath especially with exertion.  She has been needing thoracentesis almost on a weekly basis on the right side.  Patient is not interested in Pleurx catheter placement [given technical difficulty]; she is okay with thoracentesis.  Patient continues to have pain in her shoulders back anteriorly/chest wall-from malignancy.  Patient is currently on MS Contin 45 mg 3 times a day; taking oxycodone 10 every  4-6 hours as needed.   Patient had episode of nausea vomiting post chemo/currently improved.   REVIEW OF SYSTEMS:  A complete 10 point review of system is done which is negative except mentioned above/history of present illness.   PAST MEDICAL HISTORY :  Past Medical History:  Diagnosis Date  . Anemia   . BRCA negative 03/2014   [facility]  . Breast cancer [place] 03/[place]   Right, T1b, N1 prYpT1b,N1a, ER neg, PR < 10 %, Her 2 neu not overexpressed.   . Cancer ([facility] 03-24-14   Right breast, microcalcifications. ER negative, PR less than 10%, HER-2/neu not amplified.  . Melanoma ([facility] Sept 2011   Left neck T1a, 0.35 mm. Treated by Mohs injury  [employer].  . Personal history of chemotherapy   . PONV (postoperative nausea and vomiting)    WITH 1ST PORT PLACEMENT ONLY    PAST SURGICAL HISTORY :   Past Surgical History:  Procedure  Laterality Date  . AXILLARY LYMPH NODE BIOPSY Left 12/25/2016   Procedure: AXILLARY LYMPH NODE BIOPSY;  Surgeon: Robert Bellow, MD;  Location: [facility] ORS;  Service: General;  Laterality: Left;  . BREAST BIOPSY Right 03-24-14   +  . BREAST EXCISIONAL BIOPSY Right 10/28/2014   Lumpectomy +  . BREAST LUMPECTOMY WITH SENTINEL LYMPH NODE BIOPSY Right 10/28/2014   Procedure: right breast wide excision with sentinel node biopsy, mastoplasty, axillary dissection ;  Surgeon: Robert Bellow, MD;  Location: [facility] ORS;  Service: General;  Laterality: Right;  . BREAST RECONSTRUCTION Right 12/25/2016   Procedure: BREAST RECONSTRUCTION;  Surgeon: Wallace Going, DO;  Location: [facility] ORS;  Service: Plastics;  Laterality: Right;  . INCISIONAL BREAST BIOPSY Right 09/03/2016   Punch biopsy, invasive mammary carcinoma, Triple negative.   Marland Kitchen LATISSIMUS FLAP TO BREAST Right 12/25/2016   Procedure: LATISSIMUS FLAP TO BREAST;  Surgeon: Wallace Going, DO;  Location: [facility] ORS;  Service: Plastics;  Laterality: Right;  Right Latissimus myocutaneous flap with rotational flap from abdomen  . MASTECTOMY WITH AXILLARY LYMPH NODE DISSECTION Right 12/25/2016   Procedure: MASTECTOMY WITH AXILLARY LYMPH NODE DISSECTION;  Surgeon: Robert Bellow, MD;  Location: [facility] ORS;  Service: General;  Laterality: Right;  . MELANOMA EXCISION  Nov 2011   neck  . PORT-A-CATH REMOVAL  2017  . [facility] PLACEMENT  Dec 2015  . PORTACATH PLACEMENT N/A 09/24/2016   Procedure: INSERTION PORT-A-CATH;  Surgeon: Robert Bellow, MD;  Location: [facility] ORS;  Service: General;  Laterality: N/A;  . SIMPLE MASTECTOMY WITH AXILLARY SENTINEL NODE BIOPSY Right 12/25/2016   Procedure: SIMPLE MASTECTOMY;  Surgeon: Robert Bellow, MD;  Location: [facility] ORS;  Service: General;  Laterality: Right;    FAMILY HISTORY :   Family History  Problem Relation Age of Onset  . Cancer Mother        lung ? mid 51    SOCIAL HISTORY:   Social History    Tobacco Use  . Smoking status: Never Smoker  . Smokeless tobacco: Never Used  Substance Use Topics  . Alcohol use: No    Alcohol/week: 0.0 oz  . Drug use: No    ALLERGIES:  is allergic to hydrocodone and sulfa antibiotics.  MEDICATIONS:  Current Outpatient Medications  Medication Sig Dispense Refill  . lidocaine-prilocaine (EMLA) cream     . morphine (MS CONTIN) 15 MG 12 hr tablet Take with MS contin 30 mg [total of 45 mg] every 8 hours. 45 tablet 0  . ondansetron (ZOFRAN) 8 MG tablet Take 8 mg by mouth every 8 (eight) hours as needed for nausea or vomiting.    Marland Kitchen oxyCODONE 10 MG TABS Take 1 tablet (10 mg total) by mouth every 4 (four) hours as needed for moderate pain. 60 tablet 0  . chlorpheniramine-HYDROcodone (TUSSIONEX) 10-8 MG/5ML SUER Take 5 mLs by mouth at bedtime as needed for cough. 140 mL 0  . diazepam (VALIUM) 2 MG tablet Take 2 mg by mouth as needed.  0  . everolimus (AFINITOR) 10 MG tablet Take 10 mg by mouth.    . loperamide (IMODIUM A-D) 2 MG tablet Take 2 mg by mouth.    . loratadine (CLARITIN) 10 MG tablet Take 10 mg by mouth daily as  needed (after onpro injections).    . Misc Natural Products (DANDELION ROOT PO) Take 1 tablet by mouth 2 (two) times daily.     Marland Kitchen morphine (MS CONTIN) 30 MG 12 hr tablet Take total of 45 mg [along with 15 mg] every 8 hours 45 tablet 0  . Turmeric 500 MG CAPS Take 500 mg by mouth 2 (two) times daily.     No current facility-administered medications for this visit.    Facility-Administered Medications Ordered in Other Visits  Medication Dose Route Frequency Provider Last Rate Last Dose  . sodium chloride 0.9 % injection 10 mL  10 mL Intravenous PRN Evlyn Kanner, NP   10 mL at 11/08/14 1207    PHYSICAL EXAMINATION: ECOG PERFORMANCE STATUS: 0 - Asymptomatic  BP 124/82 (BP Location: Left Arm, Patient Position: Sitting)   Pulse (!) 108   Temp (!) 96.4 F (35.8 C) (Tympanic)   Resp 18   Wt 122 lb 7.5 oz (55.6 kg)   BMI 21.02  kg/m   Filed Weights   07/22/17 1111  Weight: 122 lb 7.5 oz (55.6 kg)    GENERAL: Moderately nourished /well developed female patient alert, no distress and comfortable.   With her husband.  She is in a wheelchair.  Temporal wasting noted EYES: no pallor or icterus OROPHARYNX: no thrush or ulceration; good dentition  NECK: supple, no masses felt LYMPH:  no palpable lymphadenopathy in the cervical Or inguinal region.  Bilateral lymphadenopathy noted.  LUNGS: Decreased breath sounds on the right side no wheeze or crackles HEART/CVS: regular rate & rhythm and no murmurs; bilateral 1+ lower extremity edema ABDOMEN:abdomen soft, non-tender and normal bowel sounds Musculoskeletal:no cyanosis of digits and no clubbing; bilateral lymphedema noted left more than right PSYCH: alert & oriented x 3 with fluent speech NEURO: no focal motor/sensory deficits SKIN: Cutaneous metastasis noted anteriorly chest; posteriorly in the right back ; clinical worsening noted.  LABORATORY DATA:  I have reviewed the data as listed    Component Value Date/Time   NA 129 (L) 07/22/2017 1101   NA 138 08/16/2014 1332   K 3.7 07/22/2017 1101   K 3.7 08/16/2014 1332   CL 95 (L) 07/22/2017 1101   CL 106 08/16/2014 1332   CO2 24 07/22/2017 1101   CO2 27 08/16/2014 1332   GLUCOSE 98 07/22/2017 1101   GLUCOSE 118 (H) 08/16/2014 1332   BUN 12 07/22/2017 1101   BUN 9 08/16/2014 1332   CREATININE 0.59 07/22/2017 1101   CREATININE 0.71 08/16/2014 1332   CALCIUM 8.1 (L) 07/22/2017 1101   CALCIUM 9.0 08/16/2014 1332   PROT 7.0 07/22/2017 1101   PROT 7.2 08/16/2014 1332   ALBUMIN 2.8 (L) 07/22/2017 1101   ALBUMIN 4.4 08/16/2014 1332   AST 47 (H) 07/22/2017 1101   AST 28 08/16/2014 1332   ALT 20 07/22/2017 1101   ALT 31 08/16/2014 1332   ALKPHOS 185 (H) 07/22/2017 1101   ALKPHOS 45 08/16/2014 1332   BILITOT 0.5 07/22/2017 1101   BILITOT 0.4 08/16/2014 1332   GFRNONAA >60 07/22/2017 1101   GFRNONAA >60  08/16/2014 1332   GFRAA >60 07/22/2017 1101   GFRAA >60 08/16/2014 1332    No results found for: SPEP, UPEP  Lab Results  Component Value Date   WBC 6.4 07/22/2017   NEUTROABS 5.2 07/22/2017   HGB 13.1 07/22/2017   HCT 38.9 07/22/2017   MCV 83.3 07/22/2017   PLT 363 07/22/2017      Chemistry  Component Value Date/Time   NA 129 (L) 07/22/2017 1101   NA 138 08/16/2014 1332   K 3.7 07/22/2017 1101   K 3.7 08/16/2014 1332   CL 95 (L) 07/22/2017 1101   CL 106 08/16/2014 1332   CO2 24 07/22/2017 1101   CO2 27 08/16/2014 1332   BUN 12 07/22/2017 1101   BUN 9 08/16/2014 1332   CREATININE 0.59 07/22/2017 1101   CREATININE 0.71 08/16/2014 1332   01-28-2017    Component Value Date/Time   CALCIUM 8.1 (L) 07/22/2017 1101   CALCIUM 9.0 08/16/2014 1332   ALKPHOS 185 (H) 07/22/2017 1101   ALKPHOS 45 08/16/2014 1332   AST 47 (H) 07/22/2017 1101   AST 28 08/16/2014 1332   ALT 20 07/22/2017 1101   ALT 31 08/16/2014 1332   BILITOT 0.5 07/22/2017 1101   BILITOT 0.4 08/16/2014 1332      No results found.   ASSESSMENT & PLAN:  Carcinoma of lower-outer quadrant of right breast in female, estrogen receptor negative ([place]) # STAGE IV RECURRENT breast cancer/ TRIPLE NEGATIVE-status post multiple lines of therapy ; Currently on DOXIL s/p cycle #1 on 7th March [[facility]]  #Patient due for Doxil cycle #2 today.  However, progressive disease noted on clinical exam; and given patient's poor tolerance to therapy [diarrhea fatigue]-recommend discontinuation of further chemotherapy; I would recommend hospice.  Patient has previously met with palliative care.  #Worsening shortness of breath/cough-multifactorial-progressive disease/right-sided pleural effusion; s/p thora; awaiting pleurex cath placement on April 4.  Patient declines evaluation for Pleurx catheter placement  #Pain second malignancy-stable on current regimen with MS Contin 45 mg 3 times a day; continue oxycodone 10 mg every 4 hours  as needed  # Diarrhea-1-2 loose stools; negative for infection/C. Difficile.  Continue Imodium as needed  # hypoxia-sec to pleural effusion/progressive malignancy; O2 saturation 88; recommend home O2  # Will refer the patient to hospice; follow-up as needed basis.    No orders of the defined types were placed in this encounter.  All questions were answered. The patient knows to call the clinic with any problems, questions or concerns.      Cammie Sickle, MD 07/22/2017 12:07 PM

## 2017-07-22 NOTE — Telephone Encounter (Signed)
Patient requesting to transition for Palliative Care to Hospice Care. They only need an order They have office notes and demographics already. Please fax order or call in verbal to [phone] if doctor in agreement to this

## 2017-07-23 ENCOUNTER — Other Ambulatory Visit: Payer: Self-pay | Admitting: Student

## 2017-07-23 ENCOUNTER — Other Ambulatory Visit: Payer: Self-pay | Admitting: Radiology

## 2017-07-24 ENCOUNTER — Ambulatory Visit: Admission: RE | Admit: 2017-07-24 | Payer: BLUE CROSS/BLUE SHIELD | Source: Ambulatory Visit

## 2017-07-24 ENCOUNTER — Ambulatory Visit
Admission: RE | Admit: 2017-07-24 | Discharge: 2017-07-24 | Disposition: A | Discharge status: Home / Self Care | Payer: BLUE CROSS/BLUE SHIELD | Source: Ambulatory Visit | Attending: Interventional Radiology | Admitting: Interventional Radiology

## 2017-07-24 ENCOUNTER — Ambulatory Visit
Admission: RE | Admit: 2017-07-24 | Discharge: 2017-07-24 | Disposition: A | Discharge status: Home / Self Care | Payer: BLUE CROSS/BLUE SHIELD | Source: Ambulatory Visit | Attending: Internal Medicine | Admitting: Internal Medicine

## 2017-07-24 DIAGNOSIS — J90 Pleural effusion, not elsewhere classified: Secondary | ICD-10-CM | POA: Diagnosis not present

## 2017-07-24 DIAGNOSIS — Z9889 Other specified postprocedural states: Secondary | ICD-10-CM | POA: Diagnosis present

## 2017-07-24 NOTE — Procedures (Signed)
  Procedure: R thoracentesis with [place]   EBL:   minimal Complications:  none immediate  See full dictation in [facility].  Dillard Cannon MD Main # [phone] Pager  [phone]

## 2017-08-04 ENCOUNTER — Telehealth: Payer: Self-pay | Admitting: *Deleted

## 2017-08-04 NOTE — Telephone Encounter (Signed)
[facility] Home called to report that patient expired 08/06/2017@ 6:16 AM

## 2017-08-20 DEATH — deceased

## 2018-05-29 IMAGING — CT NM PET TUM IMG RESTAG (PS) SKULL BASE T - THIGH
1 of 10 series · 1 of 25 positions shown · non-contrast
Comparison: Report from 04/12/2014 PET-CT (images not available).

CLINICAL DATA: Subsequent treatment strategy for stage II right
breast carcinoma diagnosed March 2014 status post neoadjuvant
chemotherapy with lumpectomy and axillary lymph node evaluation October 2014 and status post radiation therapy and adjuvant chemotherapy.
Recurrent right breast cancer diagnosed on skin biopsy performed
09/03/2016 for a persistent rash.

EXAM:
NUCLEAR MEDICINE PET SKULL BASE TO THIGH
TECHNIQUE: 12.5 mCi F-18 FDG was injected intravenously. Full-ring PET imaging
was performed from the skull base to thigh after the radiotracer. CT
data was obtained and used for attenuation correction and anatomic
localization.
FASTING BLOOD GLUCOSE:  Value: 72 mg/dl

[Series 3: ct wb 5.0 b30f · axial · 5.0mm · 0.98mm/px · 1 of 290 slices shown]
[im 290/290  brain]
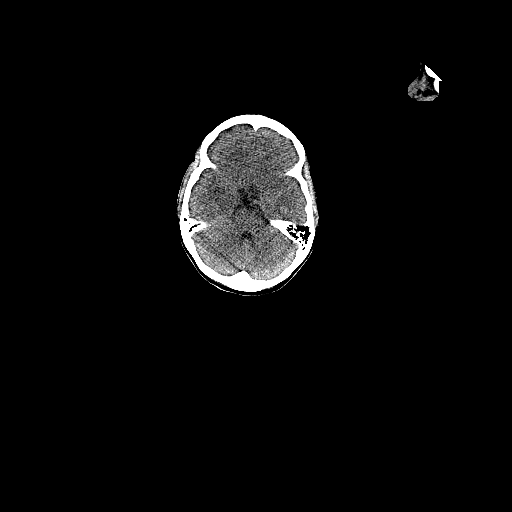

[1 of 25 positions shown; findings below may reference images not displayed]

FINDINGS: NECK

Hypermetabolic 0.8 cm left supraclavicular neck lymph node with max
SUV 4.7 (series 3/ image 57). No additional hypermetabolic lymph
nodes in the neck.

CHEST

Nonenlarged hypermetabolic right axillary lymph nodes, largest
cm with max SUV 3.5 (series 3/ image 72).

Hypermetabolic left axillary lymph nodes, largest a mildly enlarged
1.0 cm left axillary node with max SUV 5.3 (series 3/ image 73).

Hypermetabolic nonenlarged 0.7 cm right retropectoral node with max
SUV 7.4 (series 3/image 65).

Nonenlarged mildly hypermetabolic 0.5 cm high left prevascular
mediastinal node with max SUV 3.1 (series 3/image 70). No additional
enlarged or hypermetabolic mediastinal or hilar lymph nodes.

Asymmetric right breast skin thickening. Low level hypermetabolism
throughout the right breast, most prominently posterior to the right
nipple with max SUV 3.6.

Sharply marginated consolidation in the apical right upper lobe with
associated mild bronchiectasis, volume loss and distortion and low
level metabolism (max SUV 3.0), compatible with postradiation
change. No significant pulmonary nodules. No pneumothorax. No
pleural effusions.

ABDOMEN/PELVIS

No abnormal hypermetabolic activity within the liver, pancreas,
adrenal glands, or spleen. No hypermetabolic lymph nodes in the
abdomen or pelvis.

SKELETON

No focal hypermetabolic activity to suggest skeletal metastasis.
IMPRESSION: 1. Hypermetabolic bilateral axillary, right retropectoral, left
supraclavicular and high left mediastinal prevascular
lymphadenopathy compatible with nodal metastatic disease.
2. Asymmetric right breast skin thickening. Low level
hypermetabolism throughout the right breast. These are nonspecific
findings that may be due to post treatment change or locally
recurrent malignancy.
3. Postradiation changes in the apical right upper lung lobe.
4. Otherwise no extrathoracic or osseous hypermetabolic metastatic
disease.

## 2018-06-10 IMAGING — DX DG CHEST 1V PORT
1 series · 1 of 1 positions shown · non-contrast
Comparison: PET of 09/13/2016 and chest radiograph of 04/18/2014.

CLINICAL DATA: Status post Port-A-Cath insertion. Breast cancer and
melanoma.

EXAM:
PORTABLE CHEST 1 VIEW

[chest ap]
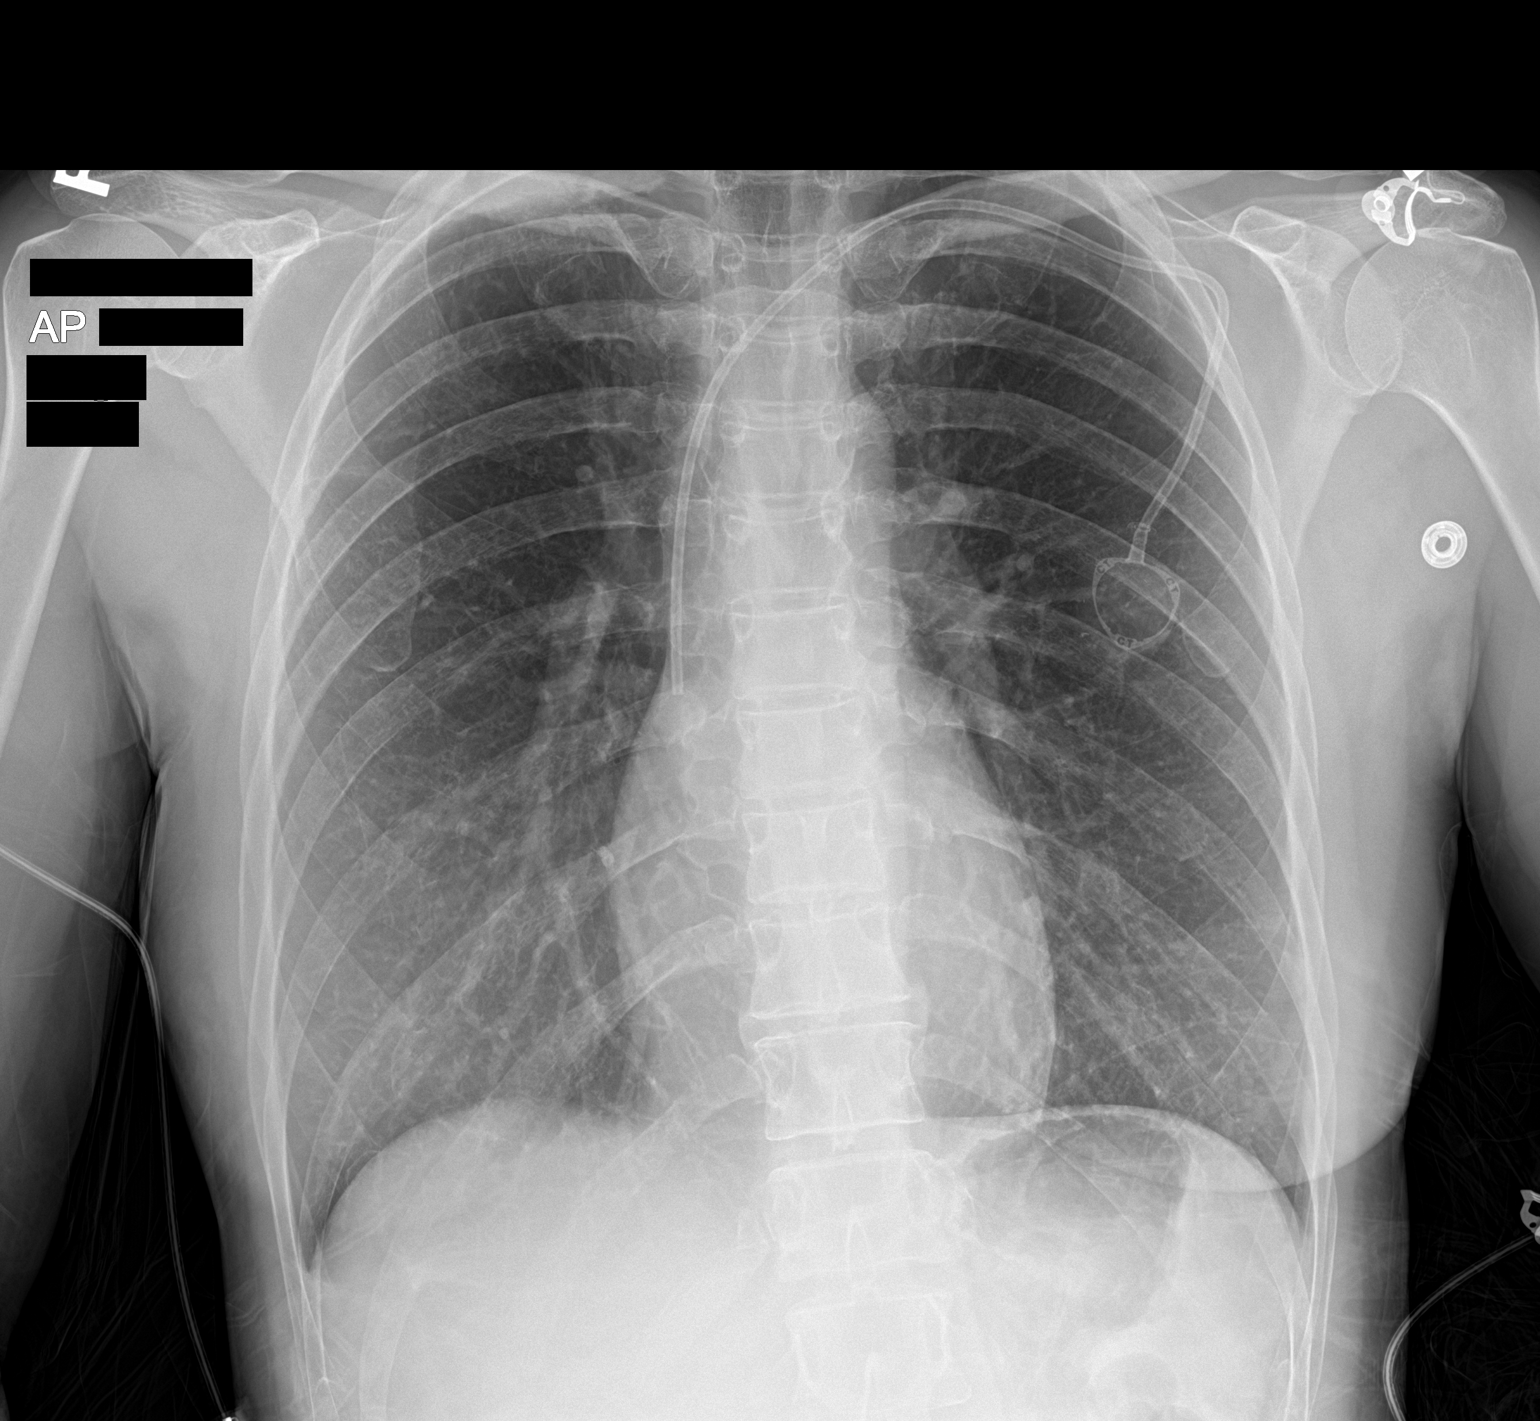

[1 of 1 positions shown; findings below may reference images not displayed]

FINDINGS: A left-sided Port-A-Cath terminates at the low SVC. Midline trachea.
Normal heart size. No pleural effusion or pneumothorax. Right apical
pleural-parenchymal scarring.
IMPRESSION: Left Port-A-Cath terminating at low SVC; no pneumothorax.

## 2018-07-26 IMAGING — US US THORACENTESIS ASP PLEURAL SPACE W/IMG GUIDE
1 series · 1 of 1 positions shown · non-contrast
Comparison: none

INDICATION: Patient with history of inflammatory right breast cancer, dyspnea,
recurrent malignant right pleural effusion. Request made for
therapeutic right thoracentesis.

[Series 1: us thoracentesis asp pleural space w/img guide · 0.17mm/px · 1 of 1 slices shown]
[im 1/1]
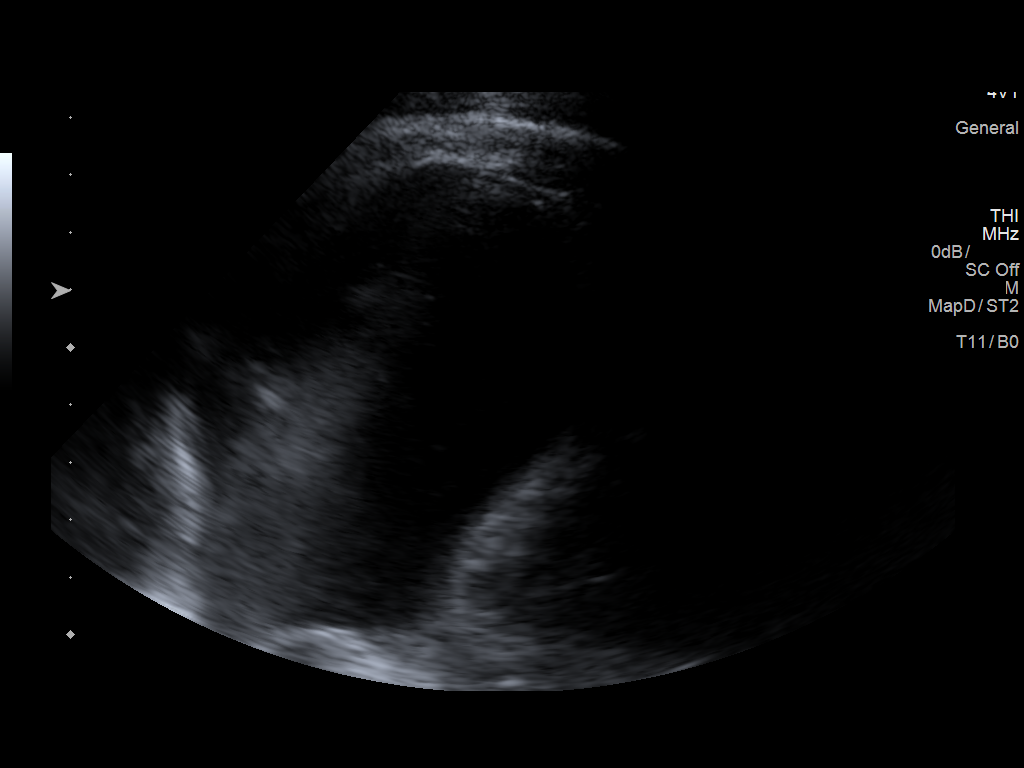

[1 of 1 positions shown; findings below may reference images not displayed]

EXAM:
ULTRASOUND GUIDED THERAPEUTIC RIGHT THORACENTESIS

MEDICATIONS:
None

COMPLICATIONS:
None immediate.

PROCEDURE:
An ultrasound guided thoracentesis was thoroughly discussed with the
patient and questions answered. The benefits, risks, alternatives
and complications were also discussed. The patient understands and
wishes to proceed with the procedure. Written consent was obtained.

Ultrasound was performed to localize and mark an adequate pocket of
fluid in the right chest. The area was then prepped and draped in
the normal sterile fashion. 1% Lidocaine was used for local
anesthesia. Under ultrasound guidance a 19 gauge, 7-cm, Yueh
catheter was introduced. Thoracentesis was performed. The catheter
was removed and a dressing applied.
FINDINGS: A total of approximately 800 cc of yellow fluid was removed. Patient
requested that only the above amount of fluid be removed today.
IMPRESSION: Successful ultrasound guided therapeutic right thoracentesis
yielding 800 cc of pleural fluid. Follow-up chest x-ray revealed no
pneumothorax.

## 2019-04-03 IMAGING — DX DG CHEST 1V
1 series · 1 of 1 positions shown · non-contrast
Comparison: 07/17/2017.  CT 12/11/2016.

CLINICAL DATA: Status post right-sided thoracentesis.

EXAM:
CHEST  1 VIEW

[chest ap]
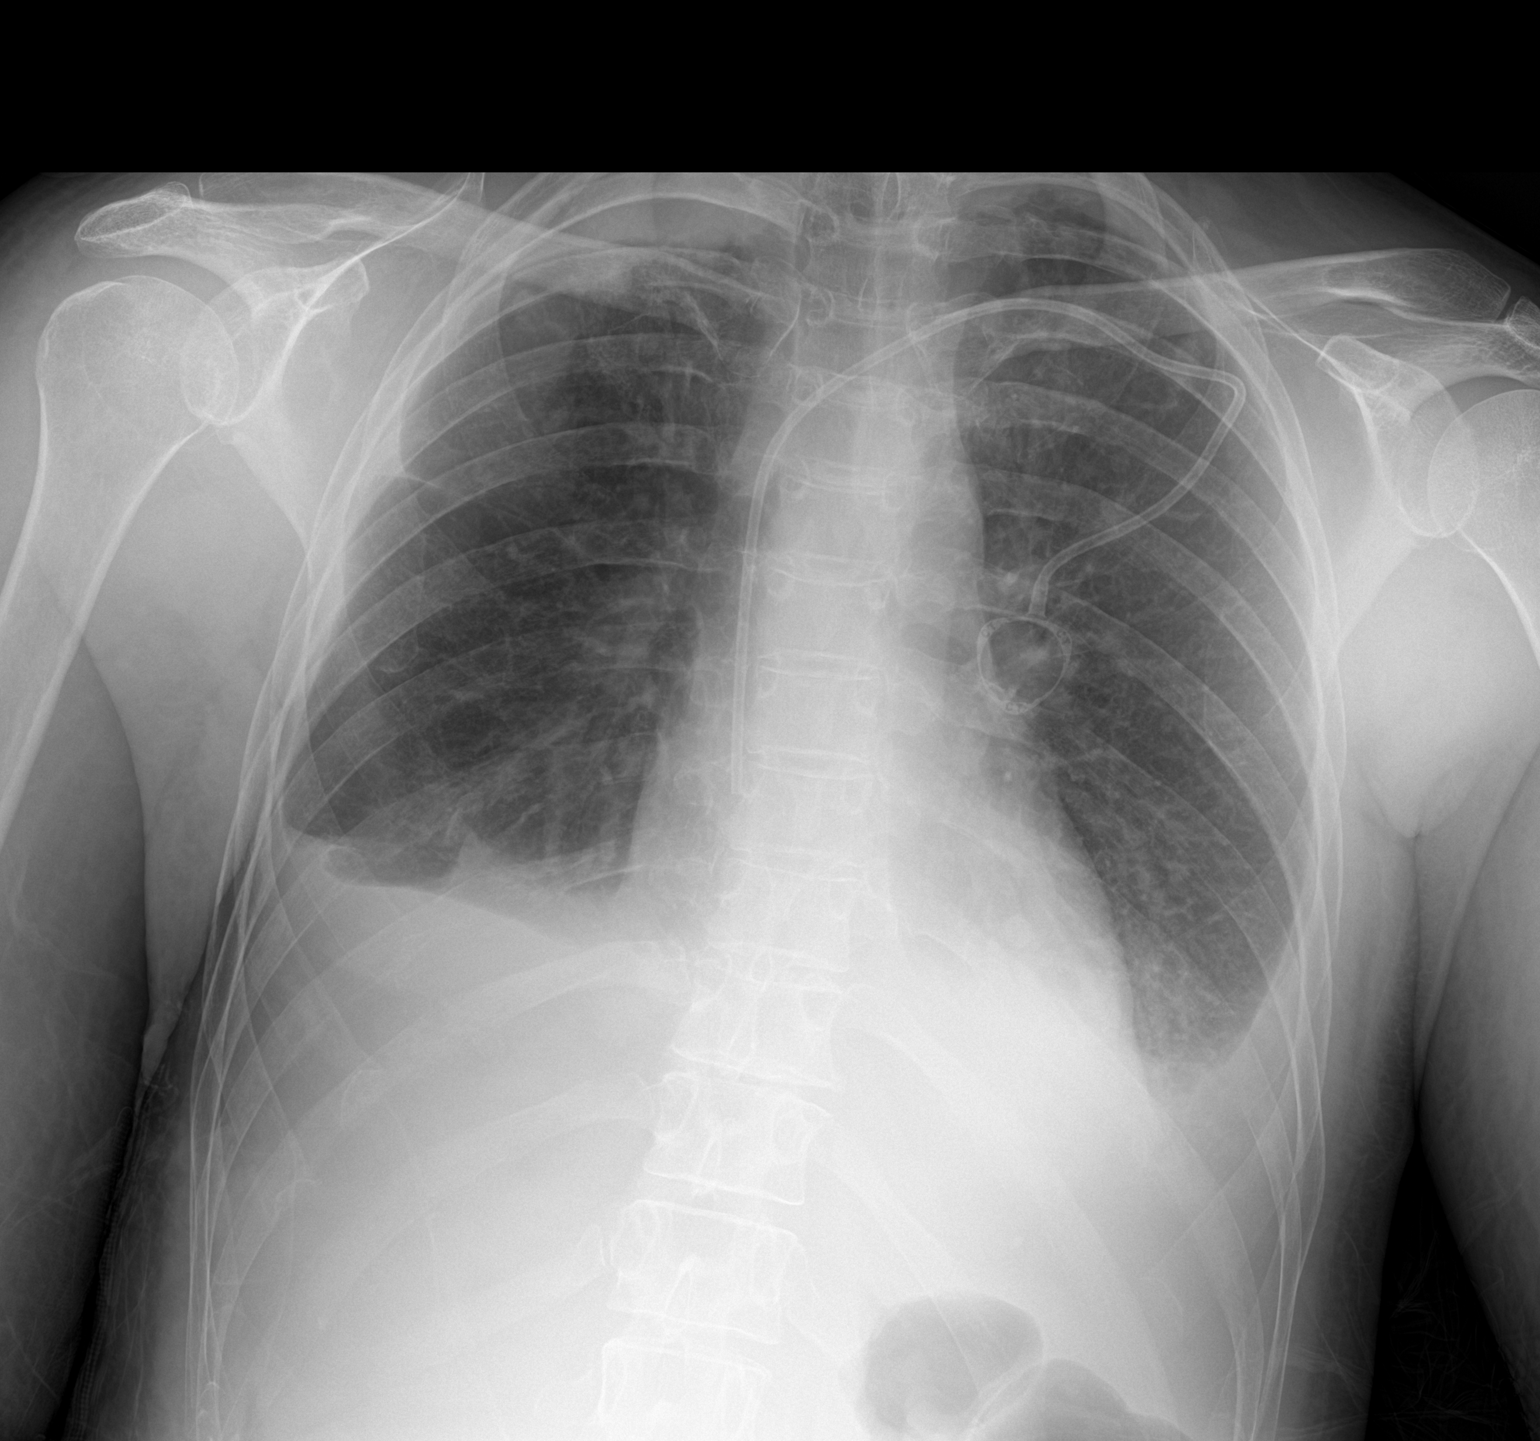

[1 of 1 positions shown; findings below may reference images not displayed]

FINDINGS: PowerPort catheter noted with tip over the right atrium in stable
position. Heart size normal. Low lung volumes with basilar
atelectasis. Biapical pleural thickening particularly on the right
again noted. No change from prior exams. Bilateral pleural effusions
without significant interim change. No evidence of pneumothorax post
thoracentesis.
IMPRESSION: No evidence of pneumothorax post thoracentesis.
